# Patient Record
Sex: Male | Born: 1945 | Race: White | Hispanic: No | Smoking: Former smoker
Health system: Southern US, Community
[De-identification: ages and names within clinical notes are randomized; demographics above are authoritative.]

## PROBLEM LIST (undated history)

## (undated) DIAGNOSIS — I714 Abdominal aortic aneurysm, without rupture, unspecified: Secondary | ICD-10-CM

## (undated) DIAGNOSIS — Z720 Tobacco use: Secondary | ICD-10-CM

## (undated) DIAGNOSIS — I2510 Atherosclerotic heart disease of native coronary artery without angina pectoris: Secondary | ICD-10-CM

## (undated) DIAGNOSIS — J449 Chronic obstructive pulmonary disease, unspecified: Secondary | ICD-10-CM

## (undated) DIAGNOSIS — N400 Enlarged prostate without lower urinary tract symptoms: Secondary | ICD-10-CM

## (undated) DIAGNOSIS — G4733 Obstructive sleep apnea (adult) (pediatric): Secondary | ICD-10-CM

## (undated) DIAGNOSIS — G4731 Primary central sleep apnea: Secondary | ICD-10-CM

## (undated) DIAGNOSIS — I252 Old myocardial infarction: Secondary | ICD-10-CM

## (undated) DIAGNOSIS — I10 Essential (primary) hypertension: Secondary | ICD-10-CM

## (undated) DIAGNOSIS — I779 Disorder of arteries and arterioles, unspecified: Secondary | ICD-10-CM

## (undated) DIAGNOSIS — E7800 Pure hypercholesterolemia, unspecified: Secondary | ICD-10-CM

## (undated) DIAGNOSIS — C61 Malignant neoplasm of prostate: Secondary | ICD-10-CM

## (undated) DIAGNOSIS — C801 Malignant (primary) neoplasm, unspecified: Secondary | ICD-10-CM

## (undated) HISTORY — DX: Abdominal aortic aneurysm, without rupture: I71.4

## (undated) HISTORY — DX: Pure hypercholesterolemia, unspecified: E78.00

## (undated) HISTORY — DX: Malignant (primary) neoplasm, unspecified: C80.1

## (undated) HISTORY — PX: HERNIA REPAIR: SHX51

## (undated) HISTORY — DX: Malignant neoplasm of prostate: C61

## (undated) HISTORY — DX: Primary central sleep apnea: G47.31

## (undated) HISTORY — PX: CATARACT EXTRACTION: SUR2

## (undated) HISTORY — DX: Disorder of arteries and arterioles, unspecified: I77.9

## (undated) HISTORY — DX: Essential (primary) hypertension: I10

## (undated) HISTORY — DX: Old myocardial infarction: I25.2

## (undated) HISTORY — DX: Abdominal aortic aneurysm, without rupture, unspecified: I71.40

---

## 2011-06-12 DIAGNOSIS — G4733 Obstructive sleep apnea (adult) (pediatric): Secondary | ICD-10-CM | POA: Diagnosis not present

## 2011-06-12 DIAGNOSIS — N400 Enlarged prostate without lower urinary tract symptoms: Secondary | ICD-10-CM | POA: Diagnosis not present

## 2011-06-12 DIAGNOSIS — F17200 Nicotine dependence, unspecified, uncomplicated: Secondary | ICD-10-CM | POA: Diagnosis not present

## 2011-06-12 DIAGNOSIS — J438 Other emphysema: Secondary | ICD-10-CM | POA: Diagnosis not present

## 2011-10-20 DIAGNOSIS — H2510 Age-related nuclear cataract, unspecified eye: Secondary | ICD-10-CM | POA: Diagnosis not present

## 2011-10-20 DIAGNOSIS — H524 Presbyopia: Secondary | ICD-10-CM | POA: Diagnosis not present

## 2011-11-05 DIAGNOSIS — Z0000 Encounter for general adult medical examination without abnormal findings: Secondary | ICD-10-CM | POA: Diagnosis not present

## 2011-11-05 DIAGNOSIS — Z23 Encounter for immunization: Secondary | ICD-10-CM | POA: Diagnosis not present

## 2011-11-05 DIAGNOSIS — Z125 Encounter for screening for malignant neoplasm of prostate: Secondary | ICD-10-CM | POA: Diagnosis not present

## 2011-11-05 DIAGNOSIS — Z136 Encounter for screening for cardiovascular disorders: Secondary | ICD-10-CM | POA: Diagnosis not present

## 2011-11-05 DIAGNOSIS — Z131 Encounter for screening for diabetes mellitus: Secondary | ICD-10-CM | POA: Diagnosis not present

## 2011-11-10 DIAGNOSIS — H2510 Age-related nuclear cataract, unspecified eye: Secondary | ICD-10-CM | POA: Diagnosis not present

## 2011-11-27 DIAGNOSIS — H2510 Age-related nuclear cataract, unspecified eye: Secondary | ICD-10-CM | POA: Diagnosis not present

## 2011-11-27 DIAGNOSIS — H25049 Posterior subcapsular polar age-related cataract, unspecified eye: Secondary | ICD-10-CM | POA: Diagnosis not present

## 2011-12-15 DIAGNOSIS — H2181 Floppy iris syndrome: Secondary | ICD-10-CM | POA: Diagnosis not present

## 2011-12-15 DIAGNOSIS — H269 Unspecified cataract: Secondary | ICD-10-CM | POA: Diagnosis not present

## 2011-12-15 DIAGNOSIS — H25049 Posterior subcapsular polar age-related cataract, unspecified eye: Secondary | ICD-10-CM | POA: Diagnosis not present

## 2012-05-05 DIAGNOSIS — G4733 Obstructive sleep apnea (adult) (pediatric): Secondary | ICD-10-CM | POA: Diagnosis not present

## 2012-05-19 DIAGNOSIS — Z86010 Personal history of colonic polyps: Secondary | ICD-10-CM | POA: Diagnosis not present

## 2012-06-02 DIAGNOSIS — G4733 Obstructive sleep apnea (adult) (pediatric): Secondary | ICD-10-CM | POA: Diagnosis not present

## 2012-06-08 DIAGNOSIS — R0681 Apnea, not elsewhere classified: Secondary | ICD-10-CM | POA: Diagnosis not present

## 2012-06-08 DIAGNOSIS — R0609 Other forms of dyspnea: Secondary | ICD-10-CM | POA: Diagnosis not present

## 2012-06-08 DIAGNOSIS — G4710 Hypersomnia, unspecified: Secondary | ICD-10-CM | POA: Diagnosis not present

## 2012-06-25 ENCOUNTER — Inpatient Hospital Stay (HOSPITAL_COMMUNITY)
Admission: EM | Admit: 2012-06-25 | Discharge: 2012-06-27 | DRG: 247 | Disposition: A | Discharge status: Home / Self Care | Payer: Medicare Other | Source: Ambulatory Visit | Attending: Cardiovascular Disease | Admitting: Cardiovascular Disease

## 2012-06-25 ENCOUNTER — Ambulatory Visit (HOSPITAL_COMMUNITY): Admit: 2012-06-25 | Payer: Self-pay | Admitting: Cardiovascular Disease

## 2012-06-25 ENCOUNTER — Encounter (HOSPITAL_COMMUNITY)
Admission: EM | Disposition: A | Discharge status: Home / Self Care | Payer: Self-pay | Source: Ambulatory Visit | Attending: Cardiovascular Disease

## 2012-06-25 ENCOUNTER — Encounter (HOSPITAL_COMMUNITY): Payer: Self-pay | Admitting: Physician Assistant

## 2012-06-25 DIAGNOSIS — F17200 Nicotine dependence, unspecified, uncomplicated: Secondary | ICD-10-CM | POA: Diagnosis not present

## 2012-06-25 DIAGNOSIS — Z79899 Other long term (current) drug therapy: Secondary | ICD-10-CM

## 2012-06-25 DIAGNOSIS — Z720 Tobacco use: Secondary | ICD-10-CM

## 2012-06-25 DIAGNOSIS — R079 Chest pain, unspecified: Secondary | ICD-10-CM | POA: Diagnosis not present

## 2012-06-25 DIAGNOSIS — I2119 ST elevation (STEMI) myocardial infarction involving other coronary artery of inferior wall: Principal | ICD-10-CM

## 2012-06-25 DIAGNOSIS — J4489 Other specified chronic obstructive pulmonary disease: Secondary | ICD-10-CM | POA: Diagnosis present

## 2012-06-25 DIAGNOSIS — R0789 Other chest pain: Secondary | ICD-10-CM | POA: Diagnosis not present

## 2012-06-25 DIAGNOSIS — E871 Hypo-osmolality and hyponatremia: Secondary | ICD-10-CM | POA: Diagnosis not present

## 2012-06-25 DIAGNOSIS — J449 Chronic obstructive pulmonary disease, unspecified: Secondary | ICD-10-CM | POA: Diagnosis present

## 2012-06-25 DIAGNOSIS — I219 Acute myocardial infarction, unspecified: Secondary | ICD-10-CM | POA: Diagnosis not present

## 2012-06-25 DIAGNOSIS — I2510 Atherosclerotic heart disease of native coronary artery without angina pectoris: Secondary | ICD-10-CM

## 2012-06-25 DIAGNOSIS — G4733 Obstructive sleep apnea (adult) (pediatric): Secondary | ICD-10-CM | POA: Diagnosis present

## 2012-06-25 HISTORY — DX: Benign prostatic hyperplasia without lower urinary tract symptoms: N40.0

## 2012-06-25 HISTORY — DX: Tobacco use: Z72.0

## 2012-06-25 HISTORY — DX: Obstructive sleep apnea (adult) (pediatric): G47.33

## 2012-06-25 HISTORY — PX: LEFT HEART CATHETERIZATION WITH CORONARY ANGIOGRAM: SHX5451

## 2012-06-25 HISTORY — DX: Atherosclerotic heart disease of native coronary artery without angina pectoris: I25.10

## 2012-06-25 HISTORY — DX: Chronic obstructive pulmonary disease, unspecified: J44.9

## 2012-06-25 HISTORY — PX: CORONARY ANGIOPLASTY WITH STENT PLACEMENT: SHX49

## 2012-06-25 HISTORY — PX: PERCUTANEOUS CORONARY STENT INTERVENTION (PCI-S): SHX5485

## 2012-06-25 LAB — CBC
MCV: 93.7 fL (ref 78.0–100.0)
Platelets: 112 10*3/uL — ABNORMAL LOW (ref 150–400)
RDW: 13 % (ref 11.5–15.5)
WBC: 8 10*3/uL (ref 4.0–10.5)

## 2012-06-25 LAB — APTT: aPTT: 110 seconds — ABNORMAL HIGH (ref 24–37)

## 2012-06-25 LAB — COMPREHENSIVE METABOLIC PANEL
ALT: 18 U/L (ref 0–53)
AST: 33 U/L (ref 0–37)
Albumin: 3.1 g/dL — ABNORMAL LOW (ref 3.5–5.2)
Chloride: 102 mEq/L (ref 96–112)
Creatinine, Ser: 0.9 mg/dL (ref 0.50–1.35)
Sodium: 134 mEq/L — ABNORMAL LOW (ref 135–145)
Total Bilirubin: 0.5 mg/dL (ref 0.3–1.2)

## 2012-06-25 LAB — POCT ACTIVATED CLOTTING TIME: Activated Clotting Time: 595 seconds

## 2012-06-25 LAB — TROPONIN I
Troponin I: 3.25 ng/mL (ref <0.30)
Troponin I: 12.33 ng/mL (ref <0.30)

## 2012-06-25 LAB — MRSA PCR SCREENING: MRSA by PCR: NEGATIVE

## 2012-06-25 LAB — PROTIME-INR: Prothrombin Time: 55.7 seconds — ABNORMAL HIGH (ref 11.6–15.2)

## 2012-06-25 LAB — CK TOTAL AND CKMB (NOT AT ARMC): Total CK: 210 U/L (ref 7–232)

## 2012-06-25 SURGERY — LEFT HEART CATHETERIZATION WITH CORONARY ANGIOGRAM
Anesthesia: LOCAL

## 2012-06-25 MED ORDER — ONDANSETRON HCL 4 MG/2ML IJ SOLN
INTRAMUSCULAR | Status: AC
  Filled 2012-06-25: qty 2

## 2012-06-25 MED ORDER — SODIUM CHLORIDE 0.9 % IV SOLN: INTRAVENOUS | Status: AC

## 2012-06-25 MED ORDER — TAMSULOSIN HCL 0.4 MG PO CAPS
0.8000 mg | ORAL_CAPSULE | Freq: Every day | ORAL | Status: DC
  Administered 2012-06-25 – 2012-06-26 (×2): 0.8 mg via ORAL
  Filled 2012-06-25 (×3): qty 2

## 2012-06-25 MED ORDER — METOPROLOL TARTRATE 25 MG PO TABS
25.0000 mg | ORAL_TABLET | Freq: Two times a day (BID) | ORAL | Status: DC
  Administered 2012-06-25 – 2012-06-27 (×4): 25 mg via ORAL
  Filled 2012-06-25 (×5): qty 1

## 2012-06-25 MED ORDER — ATROPINE SULFATE 1 MG/ML IJ SOLN
INTRAMUSCULAR | Status: AC
  Filled 2012-06-25: qty 1

## 2012-06-25 MED ORDER — PRASUGREL HCL 10 MG PO TABS
10.0000 mg | ORAL_TABLET | Freq: Every day | ORAL | Status: DC
  Administered 2012-06-26 – 2012-06-27 (×2): 10 mg via ORAL
  Filled 2012-06-25 (×2): qty 1

## 2012-06-25 MED ORDER — BIVALIRUDIN 250 MG IV SOLR
INTRAVENOUS | Status: AC
  Filled 2012-06-25: qty 250

## 2012-06-25 MED ORDER — ZOLPIDEM TARTRATE 5 MG PO TABS: 5.0000 mg | ORAL_TABLET | Freq: Every evening | ORAL | Status: DC | PRN

## 2012-06-25 MED ORDER — ONDANSETRON HCL 4 MG/2ML IJ SOLN: 4.0000 mg | Freq: Four times a day (QID) | INTRAMUSCULAR | Status: DC | PRN

## 2012-06-25 MED ORDER — FENTANYL CITRATE 0.05 MG/ML IJ SOLN
INTRAMUSCULAR | Status: AC
  Filled 2012-06-25: qty 2

## 2012-06-25 MED ORDER — PRASUGREL HCL 10 MG PO TABS
ORAL_TABLET | ORAL | Status: AC
  Filled 2012-06-25: qty 6

## 2012-06-25 MED ORDER — OXYCODONE-ACETAMINOPHEN 5-325 MG PO TABS: 1.0000 | ORAL_TABLET | ORAL | Status: DC | PRN

## 2012-06-25 MED ORDER — NITROGLYCERIN 1 MG/10 ML FOR IR/CATH LAB
INTRA_ARTERIAL | Status: AC
  Filled 2012-06-25: qty 10

## 2012-06-25 MED ORDER — ASPIRIN 81 MG PO CHEW
81.0000 mg | CHEWABLE_TABLET | Freq: Every day | ORAL | Status: DC
  Administered 2012-06-26 – 2012-06-27 (×2): 81 mg via ORAL
  Filled 2012-06-25 (×2): qty 1

## 2012-06-25 MED ORDER — ATORVASTATIN CALCIUM 80 MG PO TABS
80.0000 mg | ORAL_TABLET | Freq: Every day | ORAL | Status: DC
  Administered 2012-06-25 – 2012-06-26 (×2): 80 mg via ORAL
  Filled 2012-06-25 (×3): qty 1

## 2012-06-25 MED ORDER — ACETAMINOPHEN 325 MG PO TABS: 650.0000 mg | ORAL_TABLET | ORAL | Status: DC | PRN

## 2012-06-25 NOTE — Progress Notes (Signed)
Chaplain Note:  Chaplain visited pt's family and cath lab staff during pt's cath procedure.  Chaplain provided spiritual comfort and support for pt's family and cath lab staff.  All expressed appreciation for chaplain support.  Chaplain will follow up as needed.  06/25/12 1400  Clinical Encounter Type  Visited With Patient;Family;Health care provider  Visit Type Spiritual support  Referral From Other (Comment) (Code Stemi page)  Spiritual Encounters  Spiritual Needs Emotional  Stress Factors  Patient Stress Factors Health changes  Family Stress Factors Lack of knowledge;Major life changes  Verdie Shire, Chaplain [phone]

## 2012-06-25 NOTE — Progress Notes (Signed)
CRITICAL VALUE ALERT  Critical value received: Trop 3.25 MB 8.2  Date of notification:  06/25/12  Time of notification:  1320  Critical value read back:yes  Nurse who received alert:  P. Mikle Bosworth RN  MD notified (1st page): expected value  Time of first page: N/A  MD notified (2nd page):  Time of second page:  Responding MD:N /A  Time MD responded:N/A

## 2012-06-25 NOTE — Progress Notes (Signed)
Rhonda Barrett PA notified of INR 7.07. Instructed per Theodore Demark PA to notify pharmacist with value for follow up lab. Pharmacist notified and requested PT/INR to be drawn in AM 06/26/12. Orders initiated.

## 2012-06-25 NOTE — Progress Notes (Signed)
CRITICAL VALUE ALERT  Critical value received:  INR 7.07  Date of notification: 06/25/12  Time of notification:  1600  Critical value read backyes  Nurse who received alert: P. Mikle Bosworth RN  MD notified (1st page):  Theodore Demark PA  Time of first page:1600  MD notified (2nd page):  Time of second page:  Responding MD:  Theodore Demark NP  Time MD responded:  1600

## 2012-06-25 NOTE — H&P (Signed)
History and Physical   Patient ID: Juan Lynn MRN: [id number], DOB/AGE: [birth year] 67 y.o. Date of Encounter: 06/25/2012  Primary Physician: Pending Primary Cardiologist: None  Chief Complaint:  Inferior STEMI  HPI: Juan Lynn is a 67 y.o. male with no history of CAD. He has been having occasional episodes of chest pain that resolved without intervention in less than an hour. Today, he had onset of substernal chest pain at approximately 9 AM. This was associated with shortness of breath, some nausea and diaphoresis. When his symptoms did not resolve, EMS was called. His ECG was consistent with an inferior ST elevation MI. He was transferred emergently to [facility] cone and taken directly to the Cath Lab.  He has no history of coronary artery disease and no cardiac evaluations in the past. He is not aware of any history of hypertension, hyperlipidemia or diabetes. He gets yearly checkups but rarely sees a physician in between as his general health is quite good. He has had no recent illnesses, and has no ongoing bleeding issues or other concerns.   Past Medical History  Diagnosis Date  . BPH (benign prostatic hyperplasia)   . COPD (chronic obstructive pulmonary disease)   . OSA (obstructive sleep apnea)   . Tobacco abuse     Surgical History:  Past Surgical History  Procedure Laterality Date  . Hernia repair    . Cataract extraction       I have reviewed the patient's current medications. Prior to Admission medications   Flomax    Scheduled Meds: Continuous Infusions: PRN Meds:.    Allergies: No Known Allergies  History   Social History  . Marital Status:  divorced     Spouse Name: N/A    Number of Children: N/A  . Years of Education: N/A   Occupational History  . Retired    Social History Main Topics  . Smoking status:  current smoker, 50-pack-year history   . Smokeless tobacco:  none   . Alcohol Use:  none   . Drug Use:  none   . Sexually Active:  none     Other Topics Concern  . Not on file   Social History Narrative   Patient lives with his daughter    Family History  Problem Relation Age of Onset  . Heart attack Father   . CAD Father    Family Status  Relation Status Death Age  . Father Deceased 58    Died of prostate cancer, history of CAD  . Mother Deceased     No history of CAD    Review of Systems:   Full 14-point review of systems otherwise negative except as noted above.  Physical Exam: Pulse 81, height 5\' 7"  (1.702 m), weight 165 lb (74.844 kg). 110/70 General: Well developed, well nourished,male in acute distress. Head: Normocephalic, atraumatic, sclera non-icteric, no xanthomas, nares are without discharge. Dentition: Good Neck: No carotid bruits. JVD not elevated. No thyromegally Lungs: Good expansion bilaterally. without wheezes or rhonchi.  Heart: Regular rate and rhythm with S1 S2.  No S3 or S4.  No murmur, no rubs, or gallops appreciated. Abdomen: Soft, non-tender, non-distended with normoactive bowel sounds. No hepatomegaly. No rebound/guarding. No obvious abdominal masses. Msk:  Strength and tone appear normal for age. No joint deformities or effusions, no spine or costo-vertebral angle tenderness. Extremities: No clubbing or cyanosis. No edema.  Distal pedal pulses are 2+ in 4 extrem Neuro: Alert and oriented X 3. Moves all extremities spontaneously. No focal  deficits noted. Psych:  Responds to questions appropriately with a normal affect. Skin: No rashes or lesions noted  Labs: Pending  Radiology/Studies: Pending  ECG: Sinus rhythm, heart rate 79, PR 0.108 , inferior ST elevation with reciprocal anteroseptal changes noted.  ASSESSMENT AND PLAN:  Principal Problem:   ST elevation myocardial infarction (STEMI) of inferior wall, initial episode of care   Juan Lynn has been taken directly to the Cath Lab. Further evaluation and treatment depending on the results. He will be screened for cardiac risk  factors and counseled on smoking cessation.  Theodore Demark, PA-C  I have personally seen and examined this patient with Theodore Demark, PA-C. I agree with the assessment and plan as outlined above. Inferior STEMI. Plans for emergent cardiac cath with PCI.   Juan Lynn 2:44 PM 06/25/2012

## 2012-06-25 NOTE — CV Procedure (Signed)
Cardiac Catheterization Operative Report  Juan Lynn [id number] 4/25/20142:07 PM No primary provider on file.  Procedure Performed:  1. Left Heart Catheterization 2. Selective Coronary Angiography 3. Left ventricular angiogram 4. Aspiration Thrombectomy RCA 5. PTCA/DES x 1 mid RCA 6. Angioseal RFA  Operator: Verne Carrow, MD  Indication: 67 yo male with history of COPD, tobacco abuse, OSA who is transferred via EMS with acute inferior STEMI. Pain began at 9am this morning. Code STEMI called 12:52pm. Pt with ongoing chest pain upon arrival at [facility].                                       Procedure Details: The risks, benefits, complications, treatment options, and expected outcomes were discussed with the patient. The patient and/or family concurred with the proposed plan, giving informed verbal consent. Emergency consent documented. The patient was brought to the cath lab via EMS. The patient was not sedated due to hypotension. The right groin was prepped and draped in the usual manner. Using the modified Seldinger access technique, a 6 French sheath was placed in the right femoral artery. A JL4 catheter was used to engage the left main artery. Selective angiopraphy of the left coronary system was performed. I then engaged the RCA with a 6 [place] JR4 guiding catheter. The RCA was found to be totally occluded in the mid vessel. He was given a bolus of Angiomax and a drip was started. He was given 60 mg Effient po x 1. When the ACT was greater than 200, I passed a BMW wire down the RCA and beyond the total occlusion. Flow was re-established with the wire. I then used a 2.5 x 15 mm balloon to pre-dilate the stenosis. I then passed a Terumo Priority One aspiration catheter into the mid RCA and extracted a large red thrombus. The haziness in the mid RCA improved after thrombectomy. I then carefully positioned a 3.0 x 24 mm Promus Premier in the mid RCA and deployed it at 14 atm. The  stent was post-dilated with a 3.5 x 20 mm Empire balloon x 1. There was an excellent result. The stenosis was taken from 100% down to 0%.  A pigtail catheter was used to perform a left ventricular angiogram. An Angioseal femoral artery closure device was placed in the right femoral artery.   The case was complicated by relative hypotension and bradycardia. He was given atropine x 1.   There were no immediate procedural complications. The patient was taken to the CCU in stable condition.   Hemodynamic Findings: Central aortic pressure: 108/66 Left ventricular pressure: 107/9/16  Angiographic Findings:  Left main:  No obstructive disease.   Left Anterior Descending Artery: Large caliber vessel that courses to the apex. Mild luminal irregularities in the mid vessel.   Circumflex Artery: Large caliber vessel with termination into a obtuse marginal branch. 30% proximal AV groove Circumflex stenosis. Small intermediate branch   Right Coronary Artery: Large dominant vessel with 100% mid occlusion.   Left Ventricular Angiogram: LVEF=55%  Impression: 1. Inferior STEMI secondary to occluded mid RCA 2. Successful PTCA/DES x 1 mid RCA 3. Mild disease LAD and Circumflex 4. Preserved LV systolic function  Recommendations: Will admit to CCU. He will be continued on on ASA and Effient for one year. Will start beta blocker and statin. If BP tolerates, start Ace-inh in am. Echo in am. No plans for d/c home before  Sunday.        Complications:  None. The patient tolerated the procedure well.

## 2012-06-26 DIAGNOSIS — F17200 Nicotine dependence, unspecified, uncomplicated: Secondary | ICD-10-CM

## 2012-06-26 DIAGNOSIS — I2510 Atherosclerotic heart disease of native coronary artery without angina pectoris: Secondary | ICD-10-CM | POA: Diagnosis not present

## 2012-06-26 DIAGNOSIS — I2119 ST elevation (STEMI) myocardial infarction involving other coronary artery of inferior wall: Secondary | ICD-10-CM | POA: Diagnosis not present

## 2012-06-26 DIAGNOSIS — I219 Acute myocardial infarction, unspecified: Secondary | ICD-10-CM

## 2012-06-26 DIAGNOSIS — E871 Hypo-osmolality and hyponatremia: Secondary | ICD-10-CM | POA: Diagnosis not present

## 2012-06-26 LAB — BASIC METABOLIC PANEL
BUN: 15 mg/dL (ref 6–23)
Creatinine, Ser: 0.96 mg/dL (ref 0.50–1.35)
GFR calc Af Amer: >90 mL/min (ref >90)
GFR calc non Af Amer: 84 mL/min — ABNORMAL LOW (ref >90)

## 2012-06-26 LAB — CBC
HCT: 42.8 % (ref 39.0–52.0)
MCHC: 36 g/dL (ref 30.0–36.0)
MCV: 94.3 fL (ref 78.0–100.0)
Platelets: 131 10*3/uL — ABNORMAL LOW (ref 150–400)
RDW: 13.2 % (ref 11.5–15.5)

## 2012-06-26 LAB — TROPONIN I: Troponin I: 19.04 ng/mL (ref <0.30)

## 2012-06-26 LAB — HEMOGLOBIN A1C: Mean Plasma Glucose: 105 mg/dL (ref <117)

## 2012-06-26 NOTE — Progress Notes (Signed)
  Echocardiogram 2D Echocardiogram has been performed.  Georgian Co 06/26/2012, 11:11 AM

## 2012-06-26 NOTE — Progress Notes (Signed)
SUBJECTIVE: The patient is doing well today.  At this time, he denies chest pain, shortness of breath, or any new concerns.  Marland Kitchen aspirin  81 mg Oral Daily  . atorvastatin  80 mg Oral q1800  . metoprolol tartrate  25 mg Oral BID  . prasugrel  10 mg Oral Daily  . tamsulosin  0.8 mg Oral QHS      OBJECTIVE: Physical Exam: Filed Vitals:   06/25/12 2019 06/26/12 0000 06/26/12 0604 06/26/12 0800  BP: 126/71 97/50 130/76 106/62  Pulse: 60   62  Temp: 98.8 F (37.1 C) 98.5 F (36.9 C) 97.9 F (36.6 C) 98.3 F (36.8 C)  TempSrc: Oral Oral Oral Oral  Resp:   16 18  Height:      Weight:      SpO2: 95% 92% 95% 100%    Intake/Output Summary (Last 24 hours) at 06/26/12 0936 Last data filed at 06/25/12 1800  Gross per 24 hour  Intake    780 ml  Output    300 ml  Net    480 ml    Telemetry reveals sinus rhythm  GEN- The patient is well appearing, alert and oriented x 3 today.   Head- normocephalic, atraumatic Eyes-  Sclera clear, conjunctiva pink Ears- hearing intact Oropharynx- clear Neck- supple, no JVP Lymph- no cervical lymphadenopathy Lungs- Clear to ausculation bilaterally, normal work of breathing Heart- Regular rate and rhythm, no murmurs, rubs or gallops, PMI not laterally displaced GI- soft, NT, ND, + BS Extremities- no clubbing, cyanosis, or edema, no hematoma bruit Skin- no rash or lesion Psych- euthymic mood, full affect Neuro- strength and sensation are intact  LABS: Basic Metabolic Panel:  Recent Labs  16/10/96 1355 06/26/12 0309  NA 134* 135  K 3.5 3.8  CL 102 102  CO2 21 24  GLUCOSE 153* 104*  BUN 14 15  CREATININE 0.90 0.96  CALCIUM 7.8* 8.7   Liver Function Tests:  Recent Labs  06/25/12 1355  AST 33  ALT 18  ALKPHOS 64  BILITOT 0.5  PROT 5.7*  ALBUMIN 3.1*   No results found for this basename: LIPASE, AMYLASE,  in the last 72 hours CBC:  Recent Labs  06/25/12 1355 06/26/12 0309  WBC 8.0 9.3  HGB 14.5 15.4  HCT 40.0 42.8    MCV 93.7 94.3  PLT 112* 131*   Cardiac Enzymes:  Recent Labs  06/25/12 1355 06/25/12 1547 06/25/12 2043 06/26/12 0309  CKTOTAL 210  211  --   --   --   CKMB 8.2*  --   --   --   TROPONINI 3.25* 12.33* >20.00* 19.04*   BNP: No components found with this basename: POCBNP,  D-Dimer: No results found for this basename: DDIMER,  in the last 72 hours Hemoglobin A1C:  Recent Labs  06/25/12 1355  HGBA1C 5.3   Fasting Lipid Panel: No results found for this basename: CHOL, HDL, LDLCALC, TRIG, CHOLHDL, LDLDIRECT,  in the last 72 hours Thyroid Function Tests: No results found for this basename: TSH, T4TOTAL, FREET3, T3FREE, THYROIDAB,  in the last 72 hours Anemia Panel: No results found for this basename: VITAMINB12, FOLATE, FERRITIN, TIBC, IRON, RETICCTPCT,  in the last 72 hours  RADIOLOGY: No results found.  ASSESSMENT AND PLAN:  Principal Problem:   ST elevation myocardial infarction (STEMI) of inferior wall, initial episode of care  1. STEMI Doing well s/p PCI of the RCA Echo pending Continue effient, ASA, beta blocker  2. Tobacco Cessation  advised  Transfer to telemetry Possibly home in next 24 hours or so.     Hillis Range, MD 06/26/2012 9:36 AM

## 2012-06-27 ENCOUNTER — Encounter (HOSPITAL_COMMUNITY): Payer: Self-pay | Admitting: Physician Assistant

## 2012-06-27 DIAGNOSIS — I2510 Atherosclerotic heart disease of native coronary artery without angina pectoris: Secondary | ICD-10-CM

## 2012-06-27 DIAGNOSIS — E871 Hypo-osmolality and hyponatremia: Secondary | ICD-10-CM

## 2012-06-27 DIAGNOSIS — G4733 Obstructive sleep apnea (adult) (pediatric): Secondary | ICD-10-CM

## 2012-06-27 DIAGNOSIS — I2119 ST elevation (STEMI) myocardial infarction involving other coronary artery of inferior wall: Secondary | ICD-10-CM | POA: Diagnosis not present

## 2012-06-27 DIAGNOSIS — J449 Chronic obstructive pulmonary disease, unspecified: Secondary | ICD-10-CM

## 2012-06-27 DIAGNOSIS — Z720 Tobacco use: Secondary | ICD-10-CM

## 2012-06-27 MED ORDER — EXERCISE FOR HEART AND HEALTH BOOK
Freq: Once | Status: AC
  Administered 2012-06-27: 13:00:00
  Filled 2012-06-27: qty 1

## 2012-06-27 MED ORDER — ANGIOPLASTY BOOK
Freq: Once | Status: AC
  Administered 2012-06-27: 13:00:00
  Filled 2012-06-27: qty 1

## 2012-06-27 MED ORDER — ACTIVE PARTNERSHIP FOR HEALTH OF YOUR HEART BOOK
Freq: Once | Status: AC
  Administered 2012-06-27: 13:00:00
  Filled 2012-06-27: qty 1

## 2012-06-27 MED ORDER — HEART ATTACK BOUNCING BOOK
Freq: Once | Status: AC
  Administered 2012-06-27: 13:00:00
  Filled 2012-06-27: qty 1

## 2012-06-27 MED ORDER — THE SENSUOUS HEART BOOK
Freq: Once | Status: AC
  Administered 2012-06-27: 13:00:00
  Filled 2012-06-27: qty 1

## 2012-06-27 MED ORDER — LISINOPRIL 2.5 MG PO TABS
2.5000 mg | ORAL_TABLET | Freq: Every day | ORAL | Status: DC
  Administered 2012-06-27: 2.5 mg via ORAL
  Filled 2012-06-27: qty 1

## 2012-06-27 MED ORDER — NITROGLYCERIN 0.4 MG SL SUBL: 0.4000 mg | SUBLINGUAL_TABLET | SUBLINGUAL | Status: DC | PRN

## 2012-06-27 MED ORDER — ATORVASTATIN CALCIUM 80 MG PO TABS: 80.0000 mg | ORAL_TABLET | Freq: Every day | ORAL | Status: DC

## 2012-06-27 MED ORDER — LISINOPRIL 2.5 MG PO TABS: 2.5000 mg | ORAL_TABLET | Freq: Every day | ORAL | Status: DC

## 2012-06-27 MED ORDER — ASPIRIN 81 MG PO CHEW: 81.0000 mg | CHEWABLE_TABLET | Freq: Every day | ORAL | Status: DC

## 2012-06-27 MED ORDER — PRASUGREL HCL 10 MG PO TABS: 10.0000 mg | ORAL_TABLET | Freq: Every day | ORAL | Status: DC

## 2012-06-27 MED ORDER — METOPROLOL TARTRATE 25 MG PO TABS: 25.0000 mg | ORAL_TABLET | Freq: Two times a day (BID) | ORAL | Status: DC

## 2012-06-27 NOTE — Progress Notes (Signed)
Patient ID: Aeon Kessner, male   DOB: [birth year], 67 y.o.   MRN: [id number]   Patient Name: Juan Lynn Date of Encounter: 06/27/2012    SUBJECTIVE        No chest pain or shortness of breath. Ambulating without difficulty. No complaints from right groin stick. Cardiac rehabilitation has not evaluated.  CURRENT MEDS . aspirin  81 mg Oral Daily  . atorvastatin  80 mg Oral q1800  . metoprolol tartrate  25 mg Oral BID  . prasugrel  10 mg Oral Daily  . tamsulosin  0.8 mg Oral QHS    OBJECTIVE  Filed Vitals:   06/26/12 1100 06/26/12 1330 06/26/12 2100 06/27/12 0500  BP:  99/54 110/71 124/72  Pulse:  65 69 62  Temp:  98.6 F (37 C) 98 F (36.7 C) 97.2 F (36.2 C)  TempSrc:  Oral    Resp: 18 16 16 18   Height:      Weight:      SpO2:  95% 96% 98%    Intake/Output Summary (Last 24 hours) at 06/27/12 0945 Last data filed at 06/26/12 1800  Gross per 24 hour  Intake    720 ml  Output      0 ml  Net    720 ml   Filed Weights   06/25/12 1300  Weight: 165 lb (74.844 kg)    PHYSICAL EXAM  General: Pleasant, NAD. Neuro: Alert and oriented X 3. Moves all extremities spontaneously. Psych: Normal affect. HEENT:  Normal  Neck: Supple without bruits or JVD. Lungs:  Resp regular and unlabored, CTA. Heart: RRR no s3, s4, or murmurs. Abdomen: Soft, non-tender, non-distended, BS + x 4.  Extremities: No clubbing, cyanosis or edema. DP/PT/Radials 2+ and equal bilaterally. Right groin is stable with Angio-Seal  Accessory Clinical Findings  CBC  Recent Labs  06/25/12 1355 06/26/12 0309  WBC 8.0 9.3  HGB 14.5 15.4  HCT 40.0 42.8  MCV 93.7 94.3  PLT 112* 131*   Basic Metabolic Panel  Recent Labs  06/25/12 1355 06/26/12 0309  NA 134* 135  K 3.5 3.8  CL 102 102  CO2 21 24  GLUCOSE 153* 104*  BUN 14 15  CREATININE 0.90 0.96  CALCIUM 7.8* 8.7   Liver Function Tests  Recent Labs  06/25/12 1355  AST 33  ALT 18  ALKPHOS 64  BILITOT 0.5  PROT 5.7*    ALBUMIN 3.1*   No results found for this basename: LIPASE, AMYLASE,  in the last 72 hours Cardiac Enzymes  Recent Labs  06/25/12 1355 06/25/12 1547 06/25/12 2043 06/26/12 0309  CKTOTAL 210  211  --   --   --   CKMB 8.2*  --   --   --   TROPONINI 3.25* 12.33* >20.00* 19.04*   BNP No components found with this basename: POCBNP,  D-Dimer No results found for this basename: DDIMER,  in the last 72 hours Hemoglobin A1C  Recent Labs  06/25/12 1355  HGBA1C 5.3   Fasting Lipid Panel No results found for this basename: CHOL, HDL, LDLCALC, TRIG, CHOLHDL, LDLDIRECT,  in the last 72 hours Thyroid Function Tests No results found for this basename: TSH, T4TOTAL, FREET3, T3FREE, THYROIDAB,  in the last 72 hours  TELE  Normal sinus rhythm  ECG    Radiology/Studies  No results found.  ASSESSMENT AND PLAN  Principal Problem:   ST elevation myocardial infarction (STEMI) of inferior Hewitt Garner, initial episode of care    Patient doing well. His  cardiac rehabilitation consult. Ambulate and discharge this afternoon. Echocardiogram shows EF of 55% with no mitral regurgitation with inferior Corrina Steffensen hypokinesis. Followup with Dr. Sanjuana Kava.  Signed, Valera Castle MD

## 2012-06-27 NOTE — Discharge Summary (Signed)
Discharge Summary   Patient ID: Juan Lynn,  MRN: [id number], DOB/AGE: 07-Dec-1945 67 y.o.  Admit date: 06/25/2012 Discharge date: 06/27/2012  Primary Physician: No primary provider on file. Primary Cardiologist: New- C. Clifton James, MD performed intervention   Discharge Diagnoses Principal Problem:   ST elevation myocardial infarction (STEMI) of inferior [name], initial episode of care Active Problems:   Tobacco abuse   CAD (coronary artery disease), native coronary artery   COPD (chronic obstructive pulmonary disease)   OSA (obstructive sleep apnea)   Hyponatremia  Allergies No Known Allergies  Diagnostic Studies/Procedures  CARDIAC CATHETERIZATION + PERCUTANEOUS CORONARY INTERVENTION - 06/25/12  Hemodynamic Findings:  Central aortic pressure: 108/66  Left ventricular pressure: 107/9/16  Angiographic Findings:  Left main: No obstructive disease.  Left Anterior Descending Artery: Large caliber vessel that courses to the apex. Mild luminal irregularities in the mid vessel.  Circumflex Artery: Large caliber vessel with termination into a obtuse marginal branch. 30% proximal AV groove Circumflex stenosis. Small intermediate branch  Right Coronary Artery: Large dominant vessel with 100% mid occlusion.  Left Ventricular Angiogram: LVEF=55%  Impression:  1. Inferior STEMI secondary to occluded mid RCA  2. Successful PTCA/DES x 1 mid RCA  3. Mild disease LAD and Circumflex  4. Preserved LV systolic function  2D ECHOCARDIOGRAM - 06/26/12  Left ventricle: Hypokinesis basal inferior [name]. The cavity size was normal. Dashun Borre thickness was normal. The estimated ejection fraction was 55%. Right ventricle: The cavity size was normal. Systolic function was normal.  History of Present Illness  Juan Lynn is a 67 y.o. male with no prior cardiac history, and PMHx significant for tobacco abuse, COPD and OSA who presented to [facility] hospital on 06/25/12 and underwent emergent PCI  for inferior STEMI.  He reported experiencing occasional self-limiting episodes of chest discomfort lasting < 1 hour over the past several weeks. On the date of admission, he reported an episode of substernal chest discomfort around 9 AM associated with shortness of breath, diaphoresis and nausea. When his symptoms persisted, he called EMS. EKG in the field indicated inferior ST elevations c/w inferior STEMI. He was transported emergently to the [facility] cath lab.   Hospital Course   He was quickly prepped for the procedure which was accessed via the R femoral artery. As above, this resulted the placement of a DES to a 100% occluded mid RCA lesion. Diffuse residual disease including 30% prox AVG Cx, mild luminal irregularities elsewhere was appreciated. EF 55%. He tolerated the procedure well without complications. The recommendation was made to pursue DAPT- ASA/Effient x 12 months. He was started on BB and statin. He was transferred to CCU and remained stable and asymptomatic overnight. He was noted to be very mildly hyponatremic on admission which improved through his hospitalization. Serial troponins returned markedly elevated. He did not endorse recurrent chest discomfort. Tobacco cessation counseling was provided. He was transferred to telemetry, where he continued to recover. A 2D echo was ordered and revealed EF 55%, basal inferior Juan Lynn HK.   He was evaluated by Dr. Daleen Squibb this AM who deemed him stable for discharge after completing cardiac rehabilitation. He will be discharged on the medication regimen below. BP was a bit labile the day prior to admission, but did return to a normotensive range the date of discharge. Low-dose ACEi will be started. He will need a BMET checked on follow-up to check potassium and kidney function with this. Additionally, he will need LFTs and a lipid panel in approximately 6 weeks since  the initiation of a statin. Of note, Hgb A1C returned at 5.3%.   ADD: Cardiac  rehabilitation was paged several times by the floor RN. The plan was for the patient to ambulate with them prior to discharge. A CR nurse was unable to be reached. He ambulated several times in the hall without incident- no SOB or chest pain. He has been discharged with contact information for the option to pursue outpatient cardiac rehab.   Discharge Vitals:  Blood pressure 123/82, pulse 65, temperature 97.2 F (36.2 C), temperature source Oral, resp. rate 18, height 5\' 7"  (1.702 m), weight 74.844 kg (165 lb), SpO2 98.00%.   Labs: Recent Labs     06/25/12  1355  06/26/12  0309  WBC  8.0  9.3  HGB  14.5  15.4  HCT  40.0  42.8  MCV  93.7  94.3  PLT  112*  131*   Recent Labs Lab 06/25/12 1355 06/26/12 0309  NA 134* 135  K 3.5 3.8  CL 102 102  CO2 21 24  BUN 14 15  CREATININE 0.90 0.96  CALCIUM 7.8* 8.7  PROT 5.7*  --   BILITOT 0.5  --   ALKPHOS 64  --   ALT 18  --   AST 33  --   GLUCOSE 153* 104*   Recent Labs     06/25/12  1355  HGBA1C  5.3   Recent Labs     06/25/12  1355  06/25/12  1547  06/25/12  2043  06/26/12  0309  CKTOTAL  210  211   --    --    --   CKMB  8.2*   --    --    --   TROPONINI  3.25*  12.33*  >20.00*  19.04*   Disposition:  Discharge Orders   Future Orders Complete By Expires     Diet - low sodium heart healthy  As directed     Increase activity slowly  As directed           Follow-up Information   Follow up with [facility]. (Office will call you for an appointment date and time to be scheduled within 1 week. )    Contact information:   [address] [place] [address]       Please follow up. (Please establish or follow-up with a primary care doctor. )       Discharge Medications:    Medication List    TAKE these medications       aspirin 81 MG chewable tablet  Chew 1 tablet (81 mg total) by mouth daily.     atorvastatin 80 MG tablet  Commonly known as:  LIPITOR  Take 1 tablet (80 mg total) by  mouth daily at 6 PM.     lisinopril 2.5 MG tablet  Commonly known as:  PRINIVIL,ZESTRIL  Take 1 tablet (2.5 mg total) by mouth daily.     metoprolol tartrate 25 MG tablet  Commonly known as:  LOPRESSOR  Take 1 tablet (25 mg total) by mouth 2 (two) times daily.     nitroGLYCERIN 0.4 MG SL tablet  Commonly known as:  NITROSTAT  Place 1 tablet (0.4 mg total) under the tongue every 5 (five) minutes as needed for chest pain.     prasugrel 10 MG Tabs  Commonly known as:  EFFIENT  Take 1 tablet (10 mg total) by mouth daily.     tamsulosin 0.4 MG Caps  Commonly known as:  FLOMAX  Take 0.4 mg by mouth 2 (two) times daily.       Outstanding Labs/Studies: BMET in 1 week; LFTs and lipid panel in 6 weeks  Duration of Discharge Encounter: Greater than 30 minutes including physician time.  Signed, R. Hurman Horn, PA-C 06/27/2012, 11:30 AM    Jesse Sans. Daleen Squibb, MD, [facility] Pager:  [phone]

## 2012-06-27 NOTE — Plan of Care (Signed)
Problem: Discharge Progression Outcomes Goal: Discharge plan in place and appropriate Outcome: Completed/Met Date Met:  06/27/12 Pt tol walking in hallway with no increased HR and no SHOB no CP noted. TC to cardiac rehab LMx2. Pt not seen prior to discharge.

## 2012-06-27 NOTE — Progress Notes (Signed)
DC orders received.  Patient stable with no S/S of distress.  Medication and discharge teaching/instrutions reviewed with patient and patient's son.  Patient DC home. [place], Juan Lynn

## 2012-06-28 MED FILL — Sodium Chloride IV Soln 0.9%: INTRAVENOUS | Qty: 50 | Status: AC

## 2012-06-28 NOTE — Progress Notes (Signed)
   CARE MANAGEMENT NOTE 06/28/2012  Patient:  GARVIN, ELLENA   Account Number:  [id number]  Date Initiated:  06/28/2012  Documentation initiated by:  [facility]  Subjective/Objective Assessment:   ST elevation myocardial infarction (STEMI) of inferior wall, initial episode of care     Action/Plan:   Anticipated DC Date:  06/28/2012   Anticipated DC Plan:  HOME/SELF CARE      DC Planning Services  CM consult  Medication Assistance      Choice offered to / List presented to:             Status of service:  Completed, signed off Medicare Important Message given?   (If response is "NO", the following Medicare IM given date fields will be blank) Date Medicare IM given:   Date Additional Medicare IM given:    Discharge Disposition:  HOME/SELF CARE  Per UR Regulation:    If discussed at Long Length of Stay Meetings, dates discussed:    Comments:  06/27/2012 1300 NCM provided pt with a 30 day Effient Trial Card. States she has Location manager. NCM explained the importance of bringing information so his bill will be correct. States he was going to call admitting office in am with information. Isidoro Donning RN CCM Case Mgmt phone [phone]

## 2012-07-05 ENCOUNTER — Encounter: Payer: Self-pay | Admitting: Physician Assistant

## 2012-07-05 ENCOUNTER — Ambulatory Visit (INDEPENDENT_AMBULATORY_CARE_PROVIDER_SITE_OTHER): Payer: Medicare Other | Admitting: Physician Assistant

## 2012-07-05 VITALS — BP 122/82 | HR 71 | Ht 67.5 in | Wt 173.8 lb

## 2012-07-05 DIAGNOSIS — I2510 Atherosclerotic heart disease of native coronary artery without angina pectoris: Secondary | ICD-10-CM | POA: Diagnosis not present

## 2012-07-05 DIAGNOSIS — E785 Hyperlipidemia, unspecified: Secondary | ICD-10-CM

## 2012-07-05 LAB — BASIC METABOLIC PANEL
CO2: 26 mEq/L (ref 19–32)
Chloride: 102 mEq/L (ref 96–112)
Creatinine, Ser: 1.3 mg/dL (ref 0.4–1.5)
Potassium: 4 mEq/L (ref 3.5–5.1)
Sodium: 136 mEq/L (ref 135–145)

## 2012-07-05 MED ORDER — PRASUGREL HCL 10 MG PO TABS: 10.0000 mg | ORAL_TABLET | Freq: Every day | ORAL | Status: DC

## 2012-07-05 MED ORDER — ATORVASTATIN CALCIUM 80 MG PO TABS: 80.0000 mg | ORAL_TABLET | Freq: Every day | ORAL | Status: DC

## 2012-07-05 MED ORDER — LISINOPRIL 2.5 MG PO TABS: 2.5000 mg | ORAL_TABLET | Freq: Every day | ORAL | Status: DC

## 2012-07-05 MED ORDER — NITROGLYCERIN 0.4 MG SL SUBL: 0.4000 mg | SUBLINGUAL_TABLET | SUBLINGUAL | Status: DC | PRN

## 2012-07-05 MED ORDER — METOPROLOL TARTRATE 25 MG PO TABS: 25.0000 mg | ORAL_TABLET | Freq: Two times a day (BID) | ORAL | Status: DC

## 2012-07-05 NOTE — Assessment & Plan Note (Signed)
Patient quite smoking!

## 2012-07-05 NOTE — Assessment & Plan Note (Signed)
Patient suffered a stem in the inferior wall treated with drug-eluting stent to the RCA on 06/25/12. He is doing well post MI without symptoms. Continue Effient. Avoid Viagra at this time. Discuss on office visit with Dr. Clifton James in 2 months. Increase exercise routine.

## 2012-07-05 NOTE — Patient Instructions (Signed)
**Note De-Identified Jaryn Rosko Obfuscation** Your physician recommends that you continue on your current medications as directed. Please refer to the Current Medication list given to you today.  Your physician recommends that you return for lab work in: today and in 6 weeks  Your physician recommends that you schedule a follow-up appointment in: 6 weeks

## 2012-07-05 NOTE — Assessment & Plan Note (Signed)
Follow-up BMET today. ?

## 2012-07-05 NOTE — Assessment & Plan Note (Signed)
Patient commended for smoking cessation.

## 2012-07-05 NOTE — Progress Notes (Signed)
HPI:   This is a 67 year old male patient of Dr. Clifton James, who was admitted to the hospital with the STEMI of the inferior wall treated with drug-eluting stent to the mid RCA. He has residual 30% proximal ABG circumflex, mild luminal irregularities elsewhere ejection fraction 55%. 2-D echo showed EF of 55% with basal inferior wall hypokinesis. Patient did well post MI and actually quit smoking. He is willing to start an exercise routine but would prefer not to do cardiac rehabilitation. He denies any chest pain, palpitations, dizziness, or presyncope. He does have emphysema and has chronic dyspnea on exertion. He is asking about the use of erectile dysfunction drugs.  No Known Allergies  Current Outpatient Prescriptions on File Prior to Visit: aspirin 81 MG chewable tablet, Chew 1 tablet (81 mg total) by mouth daily., Disp: , Rfl:  atorvastatin (LIPITOR) 80 MG tablet, Take 1 tablet (80 mg total) by mouth daily at 6 PM., Disp: 30 tablet, Rfl: 3 lisinopril (PRINIVIL,ZESTRIL) 2.5 MG tablet, Take 1 tablet (2.5 mg total) by mouth daily., Disp: 30 tablet, Rfl: 3 metoprolol tartrate (LOPRESSOR) 25 MG tablet, Take 1 tablet (25 mg total) by mouth 2 (two) times daily., Disp: 60 tablet, Rfl: 3 nitroGLYCERIN (NITROSTAT) 0.4 MG SL tablet, Place 1 tablet (0.4 mg total) under the tongue every 5 (five) minutes as needed for chest pain., Disp: 25 tablet, Rfl: 12 prasugrel (EFFIENT) 10 MG TABS, Take 1 tablet (10 mg total) by mouth daily., Disp: 30 tablet, Rfl: 3 tamsulosin (FLOMAX) 0.4 MG CAPS, Take 0.4 mg by mouth 2 (two) times daily., Disp: , Rfl:   No current facility-administered medications on file prior to visit.   Past Medical History:   BPH (benign prostatic hyperplasia)                           COPD (chronic obstructive pulmonary disease)                 OSA (obstructive sleep apnea)                                Tobacco abuse                                                CAD (coronary artery  disease)                   06/25/2012     Comment:a. Inf STEMI s/p cath- DES-mid RCA b. Echo- EF               55%, basal inferior HK  Past Surgical History:   HERNIA REPAIR                                                 CATARACT EXTRACTION                                           CORONARY ANGIOPLASTY WITH STENT PLACEMENT        06/25/2012     Comment:100% mid  RCA s/p DES, 30% prox AVG Cx, mild               luminal irregularities elsewhere; EF 55%  Review of patient's family history indicates:   Heart attack                   Father                   CAD                            Father                   Social History   Marital Status: Divorced            Spouse Name:                      Years of Education:                 Number of children: 2           Occupational History Occupation          Associate Professor            Comment              Retired                                   Social History Main Topics   Smoking Status: Current Every Day Smoker        Packs/Day: 0.50  Years: 50        Types: Cigarettes   Smokeless Status: Not on file                      Alcohol Use: No             Drug Use: No             Sexual Activity: Not on file        Other Topics            Concern   None on file  Social History Narrative   Patient lives with his daughter    ROS:see history of present illness otherwise negative   PHYSICAL EXAM: Well-nournished, in no acute distress. Neck: No JVD, HJR, Bruit, or thyroid enlargement  Lungs: No tachypnea, clear without wheezing, rales, or rhonchi  Cardiovascular: RRR, PMI not displaced, positive S4, no murmur, bruit, thrill, or heave.  Abdomen: BS normal. Soft without organomegaly, masses, lesions or tenderness.  Extremities: right groin without hematoma or hemorrhage a catheter site, lower extremities otherwise without cyanosis, clubbing or edema. Good distal pulses bilateral  SKin: Warm, no lesions or rashes   Musculoskeletal: No  deformities  Neuro: no focal signs  BP 122/82  Pulse 71  Ht 5' 7.5" (1.715 m)  Wt 173 lb 12.8 oz (78.835 kg)  BMI 26.8 kg/m2   ZOX:WRUEAV sinus rhythm with inferior Q waves and T-wave inversion inferiorly  Cath 06/25/12: Impression: 1. Inferior STEMI secondary to occluded mid RCA 2. Successful PTCA/DES x 1 mid RCA 3. Mild disease LAD and Circumflex 4. Preserved LV systolic function  Recommendations: Will admit to CCU. He will be continued on on ASA and Effient for one year. Will start beta blocker and statin. If BP tolerates, start  Ace-inh in am. Echo in am. No plans for d/c home before Sunday.    2Decho 06/2012:       Study Conclusions  - Left ventricle: Hypokinesis basal inferior wall. The   cavity size was normal. Wall thickness was normal. The   estimated ejection fraction was 55%. - Right ventricle: The cavity size was normal. Systolic   function was normal. Transthoracic echocardiography.  M-mode, complete 2D, spectral Doppler, and color Doppler.  Height:  Height: 170.2cm. Height: 67in.  Weight:  Weight: 74.8kg. Weight: 164.7lb.  Body mass index:  BMI: 25.8kg/m^2.  Body surface area:    BSA: 1.86m^2.  Blood pressure:     11 5/63.  Patient status:  Inpatient.  Location:  ICU/CCU  ------------------------------------------------------------

## 2012-08-11 ENCOUNTER — Encounter: Payer: Self-pay | Admitting: Cardiovascular Disease

## 2012-08-11 ENCOUNTER — Other Ambulatory Visit (INDEPENDENT_AMBULATORY_CARE_PROVIDER_SITE_OTHER): Payer: Medicare Other

## 2012-08-11 ENCOUNTER — Ambulatory Visit (INDEPENDENT_AMBULATORY_CARE_PROVIDER_SITE_OTHER): Payer: Medicare Other | Admitting: Cardiovascular Disease

## 2012-08-11 VITALS — BP 109/82 | HR 70 | Ht 67.5 in | Wt 179.4 lb

## 2012-08-11 DIAGNOSIS — E785 Hyperlipidemia, unspecified: Secondary | ICD-10-CM | POA: Diagnosis not present

## 2012-08-11 DIAGNOSIS — I2510 Atherosclerotic heart disease of native coronary artery without angina pectoris: Secondary | ICD-10-CM

## 2012-08-11 DIAGNOSIS — Z720 Tobacco use: Secondary | ICD-10-CM

## 2012-08-11 DIAGNOSIS — F17200 Nicotine dependence, unspecified, uncomplicated: Secondary | ICD-10-CM

## 2012-08-11 LAB — HEPATIC FUNCTION PANEL
Albumin: 3.8 g/dL (ref 3.5–5.2)
Alkaline Phosphatase: 92 U/L (ref 39–117)
Total Protein: 7 g/dL (ref 6.0–8.3)

## 2012-08-11 LAB — LIPID PANEL
Cholesterol: 119 mg/dL (ref 0–200)
HDL: 51.7 mg/dL (ref >39.00)
LDL Cholesterol: 51 mg/dL (ref 0–99)
Triglycerides: 82 mg/dL (ref 0.0–149.0)
VLDL: 16.4 mg/dL (ref 0.0–40.0)

## 2012-08-11 MED ORDER — SILDENAFIL CITRATE 50 MG PO TABS: 50.0000 mg | ORAL_TABLET | Freq: Every day | ORAL | Status: DC | PRN

## 2012-08-11 NOTE — Progress Notes (Signed)
History of Present Illness: 67 yo male with history of CAD s/p STEMI 06/25/12 with occluded RCA s/p DES x 1 mid RCA, COPD, tobacco abuse here today for cardiac follow up. He was admitted to [facility] 06/25/12 with an inferior STEMI, found to have occluded mid RCA treated with DES x 1 mid RCA. He has residual 30% proximal circumflex stenosis, mild luminal irregularities elsewhere, LVEF= 55%. 2-D echo showed EF of 55% with basal inferior wall hypokinesis. Patient did well post MI and actually quit smoking. He was seen by Herma Carson, PA-C 07/06/12.   He is here today for follow up. He denies any chest pain, palpitations, dizziness, or presyncope. He does have emphysema and has chronic dyspnea on exertion. He is active. He is asking for ED meds.   Primary Care Physician: Kandyce Rud  Last Lipid Profile:Lipid Panel     Component Value Date/Time   CHOL 119 08/11/2012 0924   TRIG 82.0 08/11/2012 0924   HDL 51.70 08/11/2012 0924   CHOLHDL 2 08/11/2012 0924   VLDL 16.4 08/11/2012 0924   LDLCALC 51 08/11/2012 0924     Past Medical History  Diagnosis Date  . BPH (benign prostatic hyperplasia)   . COPD (chronic obstructive pulmonary disease)   . OSA (obstructive sleep apnea)   . Tobacco abuse   . CAD (coronary artery disease) 06/25/2012    a. Inf STEMI s/p cath- DES-mid RCA b. Echo- EF 55%, basal inferior HK    Past Surgical History  Procedure Laterality Date  . Hernia repair    . Cataract extraction    . Coronary angioplasty with stent placement  06/25/2012    100% mid RCA s/p DES, 30% prox AVG Cx, mild luminal irregularities elsewhere; EF 55%    Current Outpatient Prescriptions  Medication Sig Dispense Refill  . aspirin 81 MG chewable tablet Chew 1 tablet (81 mg total) by mouth daily.      Marland Kitchen atorvastatin (LIPITOR) 80 MG tablet Take 1 tablet (80 mg total) by mouth daily at 6 PM.  90 tablet  0  . lisinopril (PRINIVIL,ZESTRIL) 2.5 MG tablet Take 1 tablet (2.5 mg total) by mouth daily.  90  tablet  0  . metoprolol tartrate (LOPRESSOR) 25 MG tablet Take 1 tablet (25 mg total) by mouth 2 (two) times daily.  180 tablet  0  . nitroGLYCERIN (NITROSTAT) 0.4 MG SL tablet Place 1 tablet (0.4 mg total) under the tongue every 5 (five) minutes as needed for chest pain.  25 tablet  0  . prasugrel (EFFIENT) 10 MG TABS Take 1 tablet (10 mg total) by mouth daily.  90 tablet  0  . tamsulosin (FLOMAX) 0.4 MG CAPS Take 0.4 mg by mouth 2 (two) times daily.       No current facility-administered medications for this visit.    No Known Allergies  History   Social History  . Marital Status: Divorced    Spouse Name: N/A    Number of Children: 2  . Years of Education: N/A   Occupational History  . Retired    Social History Main Topics  . Smoking status: Current Every Day Smoker -- 0.50 packs/day for 50 years    Types: Cigarettes  . Smokeless tobacco: Not on file  . Alcohol Use: No  . Drug Use: No  . Sexually Active: Not on file   Other Topics Concern  . Not on file   Social History Narrative   Patient lives with his daughter  Family History  Problem Relation Age of Onset  . Heart attack Father   . CAD Father     Review of Systems:  As stated in the HPI and otherwise negative.   BP 109/82  Pulse 70  Ht 5' 7.5" (1.715 m)  Wt 179 lb 6.4 oz (81.375 kg)  BMI 27.67 kg/m2  Physical Examination: General: Well developed, well nourished, NAD HEENT: OP clear, mucus membranes moist SKIN: warm, dry. No rashes. Neuro: No focal deficits Musculoskeletal: Muscle strength 5/5 all ext Psychiatric: Mood and affect normal Neck: No JVD, no carotid bruits, no thyromegaly, no lymphadenopathy. Lungs:Clear bilaterally, no wheezes, rhonci, crackles Cardiovascular: Regular rate and rhythm. No murmurs, gallops or rubs. Abdomen:Soft. Bowel sounds present. Non-tender.  Extremities: No lower extremity edema. Pulses are 2 + in the bilateral DP/PT.  Assessment and Plan:   1. CAD: Doing well.  Continue ASA and Effient for one year. Continue statin, beta blocker and Ace-inh. BP and lipids are controlled.   2. Tobacco abuse: He has stopped smoking  3. ED: Will start Viagra 50 mg po once daily as needed.

## 2012-08-11 NOTE — Patient Instructions (Addendum)
Your physician wants you to follow-up in:  6 months. You will receive a reminder letter in the mail two months in advance. If you don't receive a letter, please call our office to schedule the follow-up appointment.   

## 2012-08-12 ENCOUNTER — Telehealth: Payer: Self-pay | Admitting: *Deleted

## 2012-08-12 NOTE — Telephone Encounter (Signed)
Received prior auth request from pt's insurance for Viagra. I called [employer] [phone]) and spoke with Danita. Pt is in their system as a male. Pt will need to contact [employer] at [phone] and have them change his status to male in their system.  Viagra will then be covered for 6 tablets.  I spoke with pt and gave him this information and the contact number for [employer].

## 2012-08-30 ENCOUNTER — Ambulatory Visit: Payer: Medicare Other | Admitting: Cardiovascular Disease

## 2012-09-06 DIAGNOSIS — G4733 Obstructive sleep apnea (adult) (pediatric): Secondary | ICD-10-CM | POA: Diagnosis not present

## 2012-09-27 DIAGNOSIS — R05 Cough: Secondary | ICD-10-CM | POA: Diagnosis not present

## 2012-09-30 ENCOUNTER — Other Ambulatory Visit: Payer: Self-pay

## 2012-09-30 MED ORDER — PRASUGREL HCL 10 MG PO TABS: 10.0000 mg | ORAL_TABLET | Freq: Every day | ORAL | Status: DC

## 2012-09-30 MED ORDER — LISINOPRIL 2.5 MG PO TABS: 2.5000 mg | ORAL_TABLET | Freq: Every day | ORAL | Status: DC

## 2012-09-30 MED ORDER — ATORVASTATIN CALCIUM 80 MG PO TABS: 80.0000 mg | ORAL_TABLET | Freq: Every day | ORAL | Status: DC

## 2012-09-30 MED ORDER — METOPROLOL TARTRATE 25 MG PO TABS: 25.0000 mg | ORAL_TABLET | Freq: Two times a day (BID) | ORAL | Status: DC

## 2012-10-09 DIAGNOSIS — L02419 Cutaneous abscess of limb, unspecified: Secondary | ICD-10-CM | POA: Diagnosis not present

## 2012-10-13 DIAGNOSIS — S8010XA Contusion of unspecified lower leg, initial encounter: Secondary | ICD-10-CM | POA: Diagnosis not present

## 2012-11-05 DIAGNOSIS — I252 Old myocardial infarction: Secondary | ICD-10-CM | POA: Diagnosis not present

## 2012-11-05 DIAGNOSIS — I259 Chronic ischemic heart disease, unspecified: Secondary | ICD-10-CM | POA: Diagnosis not present

## 2012-11-05 DIAGNOSIS — L989 Disorder of the skin and subcutaneous tissue, unspecified: Secondary | ICD-10-CM | POA: Diagnosis not present

## 2012-11-05 DIAGNOSIS — G4730 Sleep apnea, unspecified: Secondary | ICD-10-CM | POA: Diagnosis not present

## 2012-11-05 DIAGNOSIS — Z79899 Other long term (current) drug therapy: Secondary | ICD-10-CM | POA: Diagnosis not present

## 2012-11-30 DIAGNOSIS — D235 Other benign neoplasm of skin of trunk: Secondary | ICD-10-CM | POA: Diagnosis not present

## 2012-11-30 DIAGNOSIS — L820 Inflamed seborrheic keratosis: Secondary | ICD-10-CM | POA: Diagnosis not present

## 2012-11-30 DIAGNOSIS — L739 Follicular disorder, unspecified: Secondary | ICD-10-CM | POA: Diagnosis not present

## 2012-11-30 DIAGNOSIS — L821 Other seborrheic keratosis: Secondary | ICD-10-CM | POA: Diagnosis not present

## 2012-12-14 DIAGNOSIS — G4733 Obstructive sleep apnea (adult) (pediatric): Secondary | ICD-10-CM | POA: Diagnosis not present

## 2013-02-04 DIAGNOSIS — G4733 Obstructive sleep apnea (adult) (pediatric): Secondary | ICD-10-CM | POA: Diagnosis not present

## 2013-05-04 DIAGNOSIS — H2510 Age-related nuclear cataract, unspecified eye: Secondary | ICD-10-CM | POA: Diagnosis not present

## 2013-05-05 DIAGNOSIS — J438 Other emphysema: Secondary | ICD-10-CM | POA: Diagnosis not present

## 2013-05-05 DIAGNOSIS — R7989 Other specified abnormal findings of blood chemistry: Secondary | ICD-10-CM | POA: Diagnosis not present

## 2013-05-05 DIAGNOSIS — I2510 Atherosclerotic heart disease of native coronary artery without angina pectoris: Secondary | ICD-10-CM | POA: Diagnosis not present

## 2013-05-05 DIAGNOSIS — N138 Other obstructive and reflux uropathy: Secondary | ICD-10-CM | POA: Diagnosis not present

## 2013-05-05 DIAGNOSIS — N401 Enlarged prostate with lower urinary tract symptoms: Secondary | ICD-10-CM | POA: Diagnosis not present

## 2013-05-05 DIAGNOSIS — Z1331 Encounter for screening for depression: Secondary | ICD-10-CM | POA: Diagnosis not present

## 2013-05-05 DIAGNOSIS — N400 Enlarged prostate without lower urinary tract symptoms: Secondary | ICD-10-CM | POA: Diagnosis not present

## 2013-05-05 DIAGNOSIS — Z79899 Other long term (current) drug therapy: Secondary | ICD-10-CM | POA: Diagnosis not present

## 2013-05-05 DIAGNOSIS — R339 Retention of urine, unspecified: Secondary | ICD-10-CM | POA: Diagnosis not present

## 2013-05-06 ENCOUNTER — Ambulatory Visit (INDEPENDENT_AMBULATORY_CARE_PROVIDER_SITE_OTHER)
Admission: RE | Admit: 2013-05-06 | Discharge: 2013-05-06 | Disposition: A | Discharge status: Home / Self Care | Payer: Medicare Other | Source: Ambulatory Visit | Attending: Internal Medicine | Admitting: Internal Medicine

## 2013-05-06 ENCOUNTER — Encounter: Payer: Self-pay | Admitting: Internal Medicine

## 2013-05-06 ENCOUNTER — Ambulatory Visit (INDEPENDENT_AMBULATORY_CARE_PROVIDER_SITE_OTHER): Payer: Medicare Other | Admitting: Internal Medicine

## 2013-05-06 VITALS — BP 126/78 | HR 80 | Temp 97.9°F | Ht 67.0 in | Wt 187.0 lb

## 2013-05-06 DIAGNOSIS — J449 Chronic obstructive pulmonary disease, unspecified: Secondary | ICD-10-CM

## 2013-05-06 DIAGNOSIS — J4489 Other specified chronic obstructive pulmonary disease: Secondary | ICD-10-CM

## 2013-05-06 DIAGNOSIS — J438 Other emphysema: Secondary | ICD-10-CM | POA: Diagnosis not present

## 2013-05-06 MED ORDER — ALBUTEROL SULFATE HFA 108 (90 BASE) MCG/ACT IN AERS: 2.0000 | INHALATION_SPRAY | Freq: Four times a day (QID) | RESPIRATORY_TRACT | Status: DC | PRN

## 2013-05-06 NOTE — Progress Notes (Signed)
   Subjective:    Patient ID: Juan Lynn, male    DOB: 10-09-1945  MRN: [id number]  HPI  49 ywom quit smoking 2014 p MI after dx of copd 2004 in [place] on prn saba but didn't need very  often referred 05/06/2013 to pulmonary clinic by Dr Drema Dallas.  05/06/2013 1st [facility] office visit/ Maximus Hoffert cc doe x 10 years indolent onset minimally progressive to only with heavy exertion which he avoids, no trouble with steps unless carrying more 25 lbs, not using any saba at all in last year.   No obvious other patterns in day to day or daytime variabilty or assoc chronic cough or cp or chest tightness, subjective wheeze overt sinus or hb symptoms. No unusual exp hx or h/o childhood pna/ asthma or knowledge of premature birth.  Sleeping ok without nocturnal  or early am exacerbation  of respiratory  c/o's or need for noct saba. Also denies any obvious fluctuation of symptoms with weather or environmental changes or other aggravating or alleviating factors except as outlined above   Current Medications, Allergies, Complete Past Medical History, Past Surgical History, Family History, and Social History were reviewed in [employer] record.             Review of Systems  Constitutional: Negative for fever, chills, activity change, appetite change and unexpected weight change.  HENT: Negative for congestion, dental problem, postnasal drip, rhinorrhea, sneezing, sore throat, trouble swallowing and voice change.   Eyes: Negative for visual disturbance.  Respiratory: Positive for shortness of breath. Negative for cough and choking.   Cardiovascular: Negative for chest pain and leg swelling.  Gastrointestinal: Negative for nausea, vomiting and abdominal pain.  Genitourinary: Negative for difficulty urinating.  Musculoskeletal: Negative for arthralgias.  Skin: Negative for rash.  Psychiatric/Behavioral: Negative for behavioral problems and confusion.       Objective:   Physical Exam  amb mod anxious wm nad  Wt Readings from Last 3 Encounters:  05/06/13 187 lb (84.823 kg)  08/11/12 179 lb 6.4 oz (81.375 kg)  07/05/12 173 lb 12.8 oz (78.835 kg)      HEENT mild turbinate edema.  Oropharynx no thrush or excess pnd or cobblestoning.  No JVD or cervical adenopathy. Mild accessory muscle hypertrophy. Trachea midline, nl thryroid. Chest was hyperinflated by percussion with diminished breath sounds and moderate increased exp time without wheeze. Hoover sign positive at mid inspiration. Regular rate and rhythm without murmur gallop or rub or increase P2 or edema.  Abd: no hsm, nl excursion. Ext warm without cyanosis or clubbing.      CXR  05/06/2013 :  Hyperinflation. Stable emphysematous changes and chronic mild interstitial prominence. No active disease.      Assessment & Plan:

## 2013-05-06 NOTE — Assessment & Plan Note (Signed)
I reviewed the Flethcher curve with patient that basically indicates  if you quit smoking when your best day FEV1 is still well preserved (as his appears to be)  it is highly unlikely you will progress to severe disease and informed the patient there was no medication on the market that has proven to change the curve or the likelihood of progression.  Therefore stopping smoking and maintaining abstinence is the most important aspect of care, not choice of inhalers or for that matter, doctors.    If starts to worsen I would have a low threshold to stop acei as his daughter thinks she hears him struggling to breath but this is likely upper airway noise because the pt is not aware of the problem and is not typical at all of copd related sob.   In meantime needs prn saba  The proper method of use, as well as anticipated side effects, of a metered-dose inhaler are discussed and demonstrated to the patient. Improved effectiveness after extensive coaching during this visit to a level of approximately  75%

## 2013-05-06 NOTE — Patient Instructions (Signed)
Please remember to go to the   x-ray department downstairs for your tests - we will call you with the results when they are available.     Please schedule a follow up office visit in 6 weeks, call sooner if needed  

## 2013-05-06 NOTE — Progress Notes (Signed)
Quick Note:  Spoke with pt and notified of results per Dr. Wert. Pt verbalized understanding and denied any questions.  ______ 

## 2013-05-13 DIAGNOSIS — G4733 Obstructive sleep apnea (adult) (pediatric): Secondary | ICD-10-CM | POA: Diagnosis not present

## 2013-05-20 ENCOUNTER — Telehealth: Payer: Self-pay | Admitting: Internal Medicine

## 2013-05-20 DIAGNOSIS — J449 Chronic obstructive pulmonary disease, unspecified: Secondary | ICD-10-CM

## 2013-05-20 NOTE — Telephone Encounter (Signed)
atc pt line rang several times and no answer. No VM WCB

## 2013-05-23 DIAGNOSIS — R972 Elevated prostate specific antigen [PSA]: Secondary | ICD-10-CM | POA: Diagnosis not present

## 2013-05-23 DIAGNOSIS — N138 Other obstructive and reflux uropathy: Secondary | ICD-10-CM | POA: Diagnosis not present

## 2013-05-23 DIAGNOSIS — N401 Enlarged prostate with lower urinary tract symptoms: Secondary | ICD-10-CM | POA: Diagnosis not present

## 2013-05-23 NOTE — Telephone Encounter (Signed)
Called spoke with pt. Made aware of recs. Pt is scheduled for PFT over [facility] at 10 AM on 06/18/14 And will see MW same day at 11:30 Nothing further needed

## 2013-05-23 NOTE — Telephone Encounter (Signed)
I spoke with the Juan Lynn and he states that at last visit Dr. Melvyn Novas mentioned that he would need some "tests" done and thay we would call to set these up with the Juan Lynn. I do not see any mention of anything needed in the note. The Juan Lynn is not sure what exactly he was going to order. Please advise. [place] Bing, CMA

## 2013-05-23 NOTE — Telephone Encounter (Signed)
Should have been set up for f/u ov in 6 weeks with pfts From ov 05/06/13

## 2013-06-08 ENCOUNTER — Encounter: Payer: Self-pay | Admitting: Internal Medicine

## 2013-06-17 ENCOUNTER — Encounter: Payer: Self-pay | Admitting: Adult Health

## 2013-06-17 ENCOUNTER — Encounter (INDEPENDENT_AMBULATORY_CARE_PROVIDER_SITE_OTHER): Payer: Self-pay

## 2013-06-17 ENCOUNTER — Ambulatory Visit (INDEPENDENT_AMBULATORY_CARE_PROVIDER_SITE_OTHER): Payer: Medicare Other | Admitting: Adult Health

## 2013-06-17 ENCOUNTER — Ambulatory Visit (HOSPITAL_COMMUNITY)
Admission: RE | Admit: 2013-06-17 | Discharge: 2013-06-17 | Disposition: A | Discharge status: Home / Self Care | Payer: Medicare Other | Source: Ambulatory Visit | Attending: Internal Medicine | Admitting: Internal Medicine

## 2013-06-17 ENCOUNTER — Ambulatory Visit: Payer: Medicare Other | Admitting: Internal Medicine

## 2013-06-17 VITALS — BP 106/64 | HR 69 | Temp 98.0°F | Ht 67.0 in | Wt 187.0 lb

## 2013-06-17 DIAGNOSIS — Z87891 Personal history of nicotine dependence: Secondary | ICD-10-CM | POA: Insufficient documentation

## 2013-06-17 DIAGNOSIS — J449 Chronic obstructive pulmonary disease, unspecified: Secondary | ICD-10-CM

## 2013-06-17 DIAGNOSIS — J4489 Other specified chronic obstructive pulmonary disease: Secondary | ICD-10-CM | POA: Insufficient documentation

## 2013-06-17 MED ORDER — ALBUTEROL SULFATE (2.5 MG/3ML) 0.083% IN NEBU
2.5000 mg | INHALATION_SOLUTION | Freq: Once | RESPIRATORY_TRACT | Status: AC
  Administered 2013-06-17: 2.5 mg via RESPIRATORY_TRACT

## 2013-06-17 MED ORDER — BUDESONIDE-FORMOTEROL FUMARATE 160-4.5 MCG/ACT IN AERO: 2.0000 | INHALATION_SPRAY | Freq: Two times a day (BID) | RESPIRATORY_TRACT | Status: DC

## 2013-06-17 NOTE — Patient Instructions (Addendum)
Begin Symbicort 160/4.60mcg 2 puffs Twice daily  , rinse after use.  Discuss with your family doctor or cardiologist regarding ACE inhibitor (Lisinopril) as it can play role in cough /wheezing .  Follow up Dr. Melvyn Novas  In 4 weeks and As needed

## 2013-06-20 LAB — PULMONARY FUNCTION TEST
DL/VA % pred: 54 %
DL/VA: 2.41 ml/min/mmHg/L
DLCO unc % pred: 50 %
DLCO unc: 14.32 ml/min/mmHg
FEF 25-75 POST: 2.48 L/s
FEF 25-75 PRE: 1.42 L/s
FEF2575-%Change-Post: 74 %
FEF2575-%PRED-POST: 105 %
FEF2575-%Pred-Pre: 60 %
FEV1-%Change-Post: 12 %
FEV1-%Pred-Post: 99 %
FEV1-%Pred-Pre: 88 %
FEV1-PRE: 2.65 L
FEV1-Post: 2.98 L
FEV1FVC-%Change-Post: 2 %
FEV1FVC-%PRED-PRE: 92 %
FEV6-%CHANGE-POST: 10 %
FEV6-%PRED-POST: 110 %
FEV6-%Pred-Pre: 100 %
FEV6-POST: 4.19 L
FEV6-Pre: 3.81 L
FEV6FVC-%CHANGE-POST: 0 %
FEV6FVC-%Pred-Post: 104 %
FEV6FVC-%Pred-Pre: 104 %
FVC-%CHANGE-POST: 9 %
FVC-%Pred-Post: 105 %
FVC-%Pred-Pre: 96 %
FVC-POST: 4.25 L
FVC-Pre: 3.89 L
PRE FEV1/FVC RATIO: 68 %
Post FEV1/FVC ratio: 70 %
Post FEV6/FVC ratio: 99 %
Pre FEV6/FVC Ratio: 98 %
RV % pred: 153 %
RV: 3.42 L
TLC % pred: 117 %
TLC: 7.53 L

## 2013-06-21 NOTE — Progress Notes (Signed)
   Subjective:    Patient ID: Juan Lynn, male    DOB: 11/24/1945  MRN: [id number]  HPI  57 ywom quit smoking 2014 p MI after dx of copd 2004 in [place] on prn saba but didn't need very  often referred 05/06/2013 to pulmonary clinic by Dr Drema Dallas.  05/06/2013 1st [facility] office visit/ Wert cc doe x 10 years indolent onset minimally progressive to only with heavy exertion which he avoids, no trouble with steps unless carrying more 25 lbs, not using any saba at all in last year.  >>xray   06/17/13 Follow up  MW pt here for PFT review.  reports breathing is doing well, no new complaints. PFT showed FEV1 88%, ratio 68 , 12% post BD , FVC 96%, DLCO 50%  patient does have some dyspnea on exertion . He feels that is mild in nature. He denies any chest pain, orthopnea, PND, or leg swelling.    Current Medications, Allergies, Complete Past Medical History, Past Surgical History, Family History, and Social History were reviewed in [employer] record.             Review of Systems  Constitutional: Negative for fever, chills, activity change, appetite change and unexpected weight change.  HENT: Negative for congestion, dental problem, postnasal drip, rhinorrhea, sneezing, sore throat, trouble swallowing and voice change.   Eyes: Negative for visual disturbance.  Respiratory: Positive for shortness of breath. Negative for cough and choking.   Cardiovascular: Negative for chest pain and leg swelling.  Gastrointestinal: Negative for nausea, vomiting and abdominal pain.  Genitourinary: Negative for difficulty urinating.  Musculoskeletal: Negative for arthralgias.  Skin: Negative for rash.  Psychiatric/Behavioral: Negative for behavioral problems and confusion.       Objective:   Physical Exam  amb mod anxious wm nad      HEENT mild turbinate edema.  Oropharynx no thrush or excess pnd or cobblestoning.  No JVD or cervical adenopathy. Mild accessory muscle  hypertrophy. Trachea midline, nl thryroid. Chest was hyperinflated by percussion with diminished breath sounds and moderate increased exp time without wheeze.  Regular rate and rhythm without murmur gallop or rub or increase P2 or edema.  Abd: no hsm, nl excursion. Ext warm without cyanosis or clubbing.      CXR  05/06/2013 : Hyperinflation. Stable emphysematous changes and chronic mild interstitial prominence. No active disease.      Assessment & Plan:

## 2013-06-21 NOTE — Assessment & Plan Note (Addendum)
Mild airflow obstruction ? Asthma component  Decreased diffusing capacity , consider CT chest in future as mild interstitial marking on cxr noted.  PFTs show  Preserved lung function w/ some reversibility with bronchodilator Would avoid ACE inhibitors if possible    Plan  Begin Symbicort 160/4.29mcg 2 puffs Twice daily  , rinse after use.  Discuss with your family doctor or cardiologist regarding ACE inhibitor (Lisinopril) as it can play role in cough /wheezing .  Follow up Dr. Melvyn Novas  In 4 weeks and As needed

## 2013-06-23 DIAGNOSIS — C61 Malignant neoplasm of prostate: Secondary | ICD-10-CM | POA: Diagnosis not present

## 2013-06-23 DIAGNOSIS — IMO0002 Reserved for concepts with insufficient information to code with codable children: Secondary | ICD-10-CM | POA: Diagnosis not present

## 2013-06-23 DIAGNOSIS — R972 Elevated prostate specific antigen [PSA]: Secondary | ICD-10-CM | POA: Diagnosis not present

## 2013-06-27 ENCOUNTER — Encounter: Payer: Self-pay | Admitting: Internal Medicine

## 2013-07-04 DIAGNOSIS — N401 Enlarged prostate with lower urinary tract symptoms: Secondary | ICD-10-CM | POA: Diagnosis not present

## 2013-07-04 DIAGNOSIS — K4090 Unilateral inguinal hernia, without obstruction or gangrene, not specified as recurrent: Secondary | ICD-10-CM | POA: Diagnosis not present

## 2013-07-04 DIAGNOSIS — N139 Obstructive and reflux uropathy, unspecified: Secondary | ICD-10-CM | POA: Diagnosis not present

## 2013-07-04 DIAGNOSIS — C61 Malignant neoplasm of prostate: Secondary | ICD-10-CM | POA: Diagnosis not present

## 2013-07-15 ENCOUNTER — Ambulatory Visit: Payer: Medicare Other | Admitting: Internal Medicine

## 2013-07-19 ENCOUNTER — Ambulatory Visit (INDEPENDENT_AMBULATORY_CARE_PROVIDER_SITE_OTHER): Payer: TRICARE For Life (TFL) | Admitting: General Surgery

## 2013-07-19 DIAGNOSIS — C61 Malignant neoplasm of prostate: Secondary | ICD-10-CM | POA: Diagnosis not present

## 2013-07-19 DIAGNOSIS — N139 Obstructive and reflux uropathy, unspecified: Secondary | ICD-10-CM | POA: Diagnosis not present

## 2013-07-19 DIAGNOSIS — N401 Enlarged prostate with lower urinary tract symptoms: Secondary | ICD-10-CM | POA: Diagnosis not present

## 2013-07-19 DIAGNOSIS — N138 Other obstructive and reflux uropathy: Secondary | ICD-10-CM | POA: Diagnosis not present

## 2013-07-21 ENCOUNTER — Ambulatory Visit (INDEPENDENT_AMBULATORY_CARE_PROVIDER_SITE_OTHER): Payer: TRICARE For Life (TFL) | Admitting: General Surgery

## 2013-07-28 ENCOUNTER — Encounter (INDEPENDENT_AMBULATORY_CARE_PROVIDER_SITE_OTHER): Payer: Self-pay | Admitting: General Surgery

## 2013-07-28 ENCOUNTER — Ambulatory Visit (INDEPENDENT_AMBULATORY_CARE_PROVIDER_SITE_OTHER): Payer: Medicare Other | Admitting: General Surgery

## 2013-07-28 ENCOUNTER — Other Ambulatory Visit (INDEPENDENT_AMBULATORY_CARE_PROVIDER_SITE_OTHER): Payer: Self-pay

## 2013-07-28 VITALS — BP 118/72 | HR 80 | Temp 98.0°F | Resp 20 | Ht 67.0 in | Wt 187.8 lb

## 2013-07-28 DIAGNOSIS — K4090 Unilateral inguinal hernia, without obstruction or gangrene, not specified as recurrent: Secondary | ICD-10-CM | POA: Insufficient documentation

## 2013-07-28 NOTE — Patient Instructions (Signed)
You have a reducible left inguinal hernia.  Since your symptoms have basically resolved, we have discussed that you have the option of proceeding with repair at this time or waiting until it bothers you more.  You have decided to wait until it begins to bother you more. That is reasonable from medical standpoint.  Please call Dr. Darrel Hoover office when you think it is time to do the surgery.     Inguinal Hernia, Adult Muscles help keep everything in the body in its proper place. But if a weak spot in the muscles develops, something can poke through. That is called a hernia. When this happens in the lower part of the belly (abdomen), it is called an inguinal hernia. (It takes its name from a part of the body in this region called the inguinal canal.) A weak spot in the wall of muscles lets some fat or part of the small intestine bulge through. An inguinal hernia can develop at any age. Men get them more often than women. CAUSES  In adults, an inguinal hernia develops over time.  It can be triggered by:  Suddenly straining the muscles of the lower abdomen.  Lifting heavy objects.  Straining to have a bowel movement. Difficult bowel movements (constipation) can lead to this.  Constant coughing. This may be caused by smoking or lung disease.  Being overweight.  Being pregnant.  Working at a job that requires long periods of standing or heavy lifting.  Having had an inguinal hernia before. One type can be an emergency situation. It is called a strangulated inguinal hernia. It develops if part of the small intestine slips through the weak spot and cannot get back into the abdomen. The blood supply can be cut off. If that happens, part of the intestine may die. This situation requires emergency surgery. SYMPTOMS  Often, a small inguinal hernia has no symptoms. It is found when a healthcare provider does a physical exam. Larger hernias usually have symptoms.   In adults, symptoms may  include:  A lump in the groin. This is easier to see when the person is standing. It might disappear when lying down.  In men, a lump in the scrotum.  Pain or burning in the groin. This occurs especially when lifting, straining or coughing.  A dull ache or feeling of pressure in the groin.  Signs of a strangulated hernia can include:  A bulge in the groin that becomes very painful and tender to the touch.  A bulge that turns red or purple.  Fever, nausea and vomiting.  Inability to have a bowel movement or to pass gas. DIAGNOSIS  To decide if you have an inguinal hernia, a healthcare provider will probably do a physical examination.  This will include asking questions about any symptoms you have noticed.  The healthcare provider might feel the groin area and ask you to cough. If an inguinal hernia is felt, the healthcare provider may try to slide it back into the abdomen.  Usually no other tests are needed. TREATMENT  Treatments can vary. The size of the hernia makes a difference. Options include:  Watchful waiting. This is often suggested if the hernia is small and you have had no symptoms.  No medical procedure will be done unless symptoms develop.  You will need to watch closely for symptoms. If any occur, contact your healthcare provider right away.  Surgery. This is used if the hernia is larger or you have symptoms.  Open surgery. This is  usually an outpatient procedure (you will not stay overnight in a hospital). An cut (incision) is made through the skin in the groin. The hernia is put back inside the abdomen. The weak area in the muscles is then repaired by herniorrhaphy or hernioplasty. Herniorrhaphy: in this type of surgery, the weak muscles are sewn back together. Hernioplasty: a patch or mesh is used to close the weak area in the abdominal wall.  Laparoscopy. In this procedure, a surgeon makes small incisions. A thin tube with a tiny video camera (called a  laparoscope) is put into the abdomen. The surgeon repairs the hernia with mesh by looking with the video camera and using two long instruments. HOME CARE INSTRUCTIONS   After surgery to repair an inguinal hernia:  You will need to take pain medicine prescribed by your healthcare provider. Follow all directions carefully.  You will need to take care of the wound from the incision.  Your activity will be restricted for awhile. This will probably include no heavy lifting for several weeks. You also should not do anything too active for a few weeks. When you can return to work will depend on the type of job that you have.  During "watchful waiting" periods, you should:  Maintain a healthy weight.  Eat a diet high in fiber (fruits, vegetables and whole grains).  Drink plenty of fluids to avoid constipation. This means drinking enough water and other liquids to keep your urine clear or pale yellow.  Do not lift heavy objects.  Do not stand for long periods of time.  Quit smoking. This should keep you from developing a frequent cough. SEEK MEDICAL CARE IF:   A bulge develops in your groin area.  You feel pain, a burning sensation or pressure in the groin. This might be worse if you are lifting or straining.  You develop a fever of more than 100.5 F (38.1 C). SEEK IMMEDIATE MEDICAL CARE IF:   Pain in the groin increases suddenly.  A bulge in the groin gets bigger suddenly and does not go down.  For men, there is sudden pain in the scrotum. Or, the size of the scrotum increases.  A bulge in the groin area becomes red or purple and is painful to touch.  You have nausea or vomiting that does not go away.  You feel your heart beating much faster than normal.  You cannot have a bowel movement or pass gas.  You develop a fever of more than 102.0 F (38.9 C). Document Released: 07/06/2008 Document Revised: 05/12/2011 Document Reviewed: 07/06/2008 [facility] Patient Information  2014 [place], [place].

## 2013-07-28 NOTE — Progress Notes (Signed)
Patient ID: Juan Lynn, male   DOB: 1946/02/03, 68 y.o.   MRN: [id number]  Chief Complaint  Patient presents with  . New Evaluation    eval [facility]    HPI Juan Lynn is a 68 y.o. male.  He is referred by Dr. Edward Jolly for evaluation of a left inguinal hernia. Dr. Reather Laurence is his PCP. He is followed by [facility] cardiology because of a history of MI and stent.  He recently underwent a prostate biopsy and was found to have low-grade prostate cancer. 4-6 weeks ago he was lifting a heavy box and felt a strain in his left groin had a lumen of pain. This has now resolved. He thinks is a small bulge initially but not now.No prior history of hernia  Morbidities include myocardial infarction with her neck And stent placement 06/25/2012. He is on SVN and aspirin. Quit tobacco use April 2014. BPH. Low-grade prostate cancer.  HPI  Past Medical History  Diagnosis Date  . BPH (benign prostatic hyperplasia)   . COPD (chronic obstructive pulmonary disease)   . OSA (obstructive sleep apnea)   . Tobacco abuse   . CAD (coronary artery disease) 06/25/2012    a. Inf STEMI s/p cath- DES-mid RCA b. Echo- EF 55%, basal inferior HK    Past Surgical History  Procedure Laterality Date  . Hernia repair    . Cataract extraction    . Coronary angioplasty with stent placement  06/25/2012    100% mid RCA s/p DES, 30% prox AVG Cx, mild luminal irregularities elsewhere; EF 55%    Family History  Problem Relation Age of Onset  . Heart attack Father   . CAD Father   . Emphysema Maternal Grandfather     smoked heavily   . Pancreatic cancer Father   . Stomach cancer Paternal Grandmother   . Prostate cancer Paternal Uncle     Social History History  Substance Use Topics  . Smoking status: Former Smoker -- 1.00 packs/day for 50 years    Types: Cigarettes    Quit date: 06/01/2012  . Smokeless tobacco: Never Used  . Alcohol Use: Yes     Comment: 3 beers daily    No Known Allergies  Current  Outpatient Prescriptions  Medication Sig Dispense Refill  . albuterol (PROAIR HFA) 108 (90 BASE) MCG/ACT inhaler Inhale 2 puffs into the lungs every 6 (six) hours as needed for wheezing or shortness of breath. 2 puffs every 4 hours as needed only  if your can't catch your breath  1 Inhaler  1  . aspirin 81 MG chewable tablet Chew 1 tablet (81 mg total) by mouth daily.      Marland Kitchen atorvastatin (LIPITOR) 80 MG tablet Take 1 tablet (80 mg total) by mouth daily at 6 PM.  90 tablet  3  . budesonide-formoterol (SYMBICORT) 160-4.5 MCG/ACT inhaler Inhale 2 puffs into the lungs 2 (two) times daily.  1 Inhaler    . finasteride (PROSCAR) 5 MG tablet Take 5 mg by mouth daily.      Marland Kitchen lisinopril (PRINIVIL,ZESTRIL) 2.5 MG tablet Take 1 tablet (2.5 mg total) by mouth daily.  90 tablet  3  . metoprolol tartrate (LOPRESSOR) 25 MG tablet Take 1 tablet (25 mg total) by mouth 2 (two) times daily.  180 tablet  3  . nitroGLYCERIN (NITROSTAT) 0.4 MG SL tablet Place 1 tablet (0.4 mg total) under the tongue every 5 (five) minutes as needed for chest pain.  25 tablet  0  .  prasugrel (EFFIENT) 10 MG TABS Take 1 tablet (10 mg total) by mouth daily.  90 tablet  3  . sildenafil (VIAGRA) 50 MG tablet Take 1 tablet (50 mg total) by mouth daily as needed for erectile dysfunction.  10 tablet  3  . tamsulosin (FLOMAX) 0.4 MG CAPS Take 0.4 mg by mouth 2 (two) times daily.       No current facility-administered medications for this visit.    Review of Systems Review of Systems  Constitutional: Negative for fever, chills and unexpected weight change.  HENT: Negative for congestion, hearing loss, sore throat, trouble swallowing and voice change.   Eyes: Negative for visual disturbance.  Respiratory: Negative for cough and wheezing.   Cardiovascular: Negative for chest pain, palpitations and leg swelling.  Gastrointestinal: Negative for nausea, vomiting, abdominal pain, diarrhea, constipation, blood in stool, abdominal distention, anal  bleeding and rectal pain.  Genitourinary: Negative for hematuria and difficulty urinating.  Musculoskeletal: Negative for arthralgias.  Skin: Negative for rash and wound.  Neurological: Negative for seizures, syncope, weakness and headaches.  Hematological: Negative for adenopathy. Does not bruise/bleed easily.  Psychiatric/Behavioral: Negative for confusion.    Blood pressure 118/72, pulse 80, temperature 98 F (36.7 C), temperature source Oral, resp. rate 20, height 5\' 7"  (1.702 m), weight 187 lb 12.8 oz (85.186 kg).  Physical Exam Physical Exam  Constitutional: He is oriented to person, place, and time. He appears well-developed and well-nourished. No distress.  HENT:  Head: Normocephalic.  Nose: Nose normal.  Mouth/Throat: No oropharyngeal exudate.  Eyes: Conjunctivae and EOM are normal. Pupils are equal, round, and reactive to light. Right eye exhibits no discharge. Left eye exhibits no discharge. No scleral icterus.  Neck: Normal range of motion. Neck supple. No JVD present. No tracheal deviation present. No thyromegaly present.  Cardiovascular: Normal rate, regular rhythm, normal heart sounds and intact distal pulses.   No murmur heard. Pulmonary/Chest: Effort normal and breath sounds normal. No stridor. No respiratory distress. He has no wheezes. He has no rales. He exhibits no tenderness.  Abdominal: Soft. Bowel sounds are normal. He exhibits no distension and no mass. There is no tenderness. There is no rebound and no guarding.  Genitourinary:  He has a small left inguinal hernia that is detectable standing. Very little bulge. No evidence of hernia on the right. Umbilicus normal. Penis scrotum and testes normal.  Musculoskeletal: Normal range of motion. He exhibits no edema and no tenderness.  Lymphadenopathy:    He has no cervical adenopathy.  Neurological: He is alert and oriented to person, place, and time. He has normal reflexes. Coordination normal.  Skin: Skin is warm  and dry. No rash noted. He is not diaphoretic. No erythema. No pallor.  Psychiatric: He has a normal mood and affect. His behavior is normal. Judgment and thought content normal.    Data Reviewed Dr. Cy Blamer office notes  Assessment    Small left inguinal hernia. Essentially asymptomatic male but previously symptomatic.  History myocardial infarction with catheterization and stent placement, on F. And and aspirin  Low-grade prostate cancer  Prior tobacco abuse, abstinent for 1 year     Plan    I described the anatomy and natural history of her hernia. I described open and laparoscopic techniques. I told him that repair was elective and it could be considered at any time or he could wait until he had more symptoms.  It was his  decision to wait until he has more symptoms. I encouraged him to  call me when he feels like it is time to go ahead with the surgery.  He is aware of the low but definite risk of incarceration and strangulation.        Edsel Petrin. Dalbert Batman, M.D., [facility] General and Minimally invasive Surgery Breast and Colorectal Surgery Office:   [phone] Pager:   [phone]  07/28/2013, 9:08 AM

## 2013-08-02 ENCOUNTER — Ambulatory Visit (INDEPENDENT_AMBULATORY_CARE_PROVIDER_SITE_OTHER): Payer: Medicare Other | Admitting: Cardiovascular Disease

## 2013-08-02 ENCOUNTER — Encounter: Payer: Self-pay | Admitting: Cardiovascular Disease

## 2013-08-02 VITALS — BP 118/70 | HR 59 | Ht 67.0 in | Wt 186.0 lb

## 2013-08-02 DIAGNOSIS — F17200 Nicotine dependence, unspecified, uncomplicated: Secondary | ICD-10-CM | POA: Diagnosis not present

## 2013-08-02 DIAGNOSIS — I2510 Atherosclerotic heart disease of native coronary artery without angina pectoris: Secondary | ICD-10-CM

## 2013-08-02 DIAGNOSIS — Z720 Tobacco use: Secondary | ICD-10-CM

## 2013-08-02 MED ORDER — CLOPIDOGREL BISULFATE 75 MG PO TABS: 75.0000 mg | ORAL_TABLET | Freq: Every day | ORAL | Status: DC

## 2013-08-02 MED ORDER — ATORVASTATIN CALCIUM 80 MG PO TABS: 80.0000 mg | ORAL_TABLET | Freq: Every day | ORAL | Status: DC

## 2013-08-02 MED ORDER — METOPROLOL TARTRATE 25 MG PO TABS: 25.0000 mg | ORAL_TABLET | Freq: Two times a day (BID) | ORAL | Status: DC

## 2013-08-02 MED ORDER — NITROGLYCERIN 0.4 MG SL SUBL: 0.4000 mg | SUBLINGUAL_TABLET | SUBLINGUAL | Status: DC | PRN

## 2013-08-02 NOTE — Patient Instructions (Addendum)
Your physician wants you to follow-up in:  12 months.  You will receive a reminder letter in the mail two months in advance. If you don't receive a letter, please call our office to schedule the follow-up appointment.   Your physician has recommended you make the following change in your medication:   Stop Lisinopril. Stop Effient. Start Clopidogrel 75 mg by mouth daily.

## 2013-08-02 NOTE — Progress Notes (Signed)
History of Present Illness: 68 yo male with history of CAD s/p STEMI 06/25/12 with occluded RCA s/p DES x 1 mid RCA, COPD, tobacco abuse here today for cardiac follow up. He was admitted to [facility] 06/25/12 with an inferior STEMI, found to have occluded mid RCA treated with DES x 1 mid RCA. He has residual 30% proximal circumflex stenosis, mild luminal irregularities elsewhere, LVEF= 55%. 2-D echo showed EF of 55% with basal inferior wall hypokinesis. Patient did well post MI and quit smoking.   He is here today for follow up. He denies any chest pain, palpitations, dizziness, or presyncope. He does have emphysema and has chronic dyspnea on exertion. He is active. He has been smoking occasionally.   Primary Care Physician: Leighton Ruff  Last Lipid Profile:Lipid Panel     Component Value Date/Time   CHOL 119 08/11/2012 0924   TRIG 82.0 08/11/2012 0924   HDL 51.70 08/11/2012 0924   CHOLHDL 2 08/11/2012 0924   VLDL 16.4 08/11/2012 0924   LDLCALC 51 08/11/2012 0924    Past Medical History  Diagnosis Date  . BPH (benign prostatic hyperplasia)   . COPD (chronic obstructive pulmonary disease)   . OSA (obstructive sleep apnea)   . Tobacco abuse   . CAD (coronary artery disease) 06/25/2012    a. Inf STEMI s/p cath- DES-mid RCA b. Echo- EF 55%, basal inferior HK    Past Surgical History  Procedure Laterality Date  . Hernia repair    . Cataract extraction    . Coronary angioplasty with stent placement  06/25/2012    100% mid RCA s/p DES, 30% prox AVG Cx, mild luminal irregularities elsewhere; EF 55%    Current Outpatient Prescriptions  Medication Sig Dispense Refill  . albuterol (PROAIR HFA) 108 (90 BASE) MCG/ACT inhaler Inhale 2 puffs into the lungs every 6 (six) hours as needed for wheezing or shortness of breath. 2 puffs every 4 hours as needed only  if your can't catch your breath  1 Inhaler  1  . aspirin 81 MG chewable tablet Chew 1 tablet (81 mg total) by mouth daily.      Marland Kitchen  atorvastatin (LIPITOR) 80 MG tablet Take 1 tablet (80 mg total) by mouth daily at 6 PM.  90 tablet  3  . budesonide-formoterol (SYMBICORT) 160-4.5 MCG/ACT inhaler Inhale 2 puffs into the lungs 2 (two) times daily.  1 Inhaler    . finasteride (PROSCAR) 5 MG tablet Take 5 mg by mouth daily.      Marland Kitchen lisinopril (PRINIVIL,ZESTRIL) 2.5 MG tablet Take 1 tablet (2.5 mg total) by mouth daily.  90 tablet  3  . metoprolol tartrate (LOPRESSOR) 25 MG tablet Take 1 tablet (25 mg total) by mouth 2 (two) times daily.  180 tablet  3  . nitroGLYCERIN (NITROSTAT) 0.4 MG SL tablet Place 1 tablet (0.4 mg total) under the tongue every 5 (five) minutes as needed for chest pain.  25 tablet  0  . prasugrel (EFFIENT) 10 MG TABS Take 1 tablet (10 mg total) by mouth daily.  90 tablet  3  . sildenafil (VIAGRA) 50 MG tablet Take 1 tablet (50 mg total) by mouth daily as needed for erectile dysfunction.  10 tablet  3  . tamsulosin (FLOMAX) 0.4 MG CAPS Take 0.4 mg by mouth 2 (two) times daily.       No current facility-administered medications for this visit.    No Known Allergies  History   Social History  . Marital  Status: Divorced    Spouse Name: N/A    Number of Children: 2  . Years of Education: N/A   Occupational History  . Retired    Social History Main Topics  . Smoking status: Former Smoker -- 1.00 packs/day for 50 years    Types: Cigarettes    Quit date: 06/01/2012  . Smokeless tobacco: Never Used  . Alcohol Use: Yes     Comment: 3 beers daily  . Drug Use: No  . Sexual Activity: Not on file   Other Topics Concern  . Not on file   Social History Narrative   Patient lives with his daughter    Family History  Problem Relation Age of Onset  . Heart attack Father   . CAD Father   . Emphysema Maternal Grandfather     smoked heavily   . Pancreatic cancer Father   . Stomach cancer Paternal Grandmother   . Prostate cancer Paternal Uncle     Review of Systems:  As stated in the HPI and otherwise  negative.   BP 118/70  Pulse 59  Ht 5\' 7"  (1.702 m)  Wt 186 lb (84.369 kg)  BMI 29.12 kg/m2  Physical Examination: General: Well developed, well nourished, NAD HEENT: OP clear, mucus membranes moist SKIN: warm, dry. No rashes. Neuro: No focal deficits Musculoskeletal: Muscle strength 5/5 all ext Psychiatric: Mood and affect normal Neck: No JVD, no carotid bruits, no thyromegaly, no lymphadenopathy. Lungs:Clear bilaterally, no wheezes, rhonci, crackles Cardiovascular: Regular rate and rhythm. No murmurs, gallops or rubs. Abdomen:Soft. Bowel sounds present. Non-tender.  Extremities: No lower extremity edema. Pulses are 2 + in the bilateral DP/PT.  EKG: Sinus brady, rate 59 bpm. LVH.  Assessment and Plan:   1. CAD: Doing well. Will change Effient to Plavix. Continue ASA. Based on recent findings in DAPT trial, continue dual anti-platelet therapy. Continue statin, beta blocker. Will stop Ace-inhibitor as he has discussed this with Dr. Melvyn Novas and wishes to stop. LV function is normal. BP and lipids are controlled.   2. Tobacco abuse: He has stopped smoking after recent relapse.

## 2013-09-12 DIAGNOSIS — J40 Bronchitis, not specified as acute or chronic: Secondary | ICD-10-CM | POA: Diagnosis not present

## 2013-09-12 DIAGNOSIS — J329 Chronic sinusitis, unspecified: Secondary | ICD-10-CM | POA: Diagnosis not present

## 2013-09-19 DIAGNOSIS — C61 Malignant neoplasm of prostate: Secondary | ICD-10-CM | POA: Diagnosis not present

## 2013-09-26 ENCOUNTER — Telehealth: Payer: Self-pay | Admitting: Cardiovascular Disease

## 2013-09-26 NOTE — Telephone Encounter (Signed)
Pt called to see if he needs antibiotics prior dental work . Pt had stent placement more than a year ago. Pt is aware that he does not needs antibiotic prior this procedure. Pt verbalized understanding.

## 2013-09-26 NOTE — Telephone Encounter (Signed)
New message    Has questions regarding pre-med before dental work.

## 2013-09-28 DIAGNOSIS — F518 Other sleep disorders not due to a substance or known physiological condition: Secondary | ICD-10-CM | POA: Diagnosis not present

## 2013-09-28 DIAGNOSIS — G4733 Obstructive sleep apnea (adult) (pediatric): Secondary | ICD-10-CM | POA: Diagnosis not present

## 2013-10-12 DIAGNOSIS — C61 Malignant neoplasm of prostate: Secondary | ICD-10-CM | POA: Diagnosis not present

## 2013-10-25 DIAGNOSIS — C61 Malignant neoplasm of prostate: Secondary | ICD-10-CM | POA: Diagnosis not present

## 2013-10-31 DIAGNOSIS — N401 Enlarged prostate with lower urinary tract symptoms: Secondary | ICD-10-CM | POA: Diagnosis not present

## 2013-10-31 DIAGNOSIS — N139 Obstructive and reflux uropathy, unspecified: Secondary | ICD-10-CM | POA: Diagnosis not present

## 2013-10-31 DIAGNOSIS — C61 Malignant neoplasm of prostate: Secondary | ICD-10-CM | POA: Diagnosis not present

## 2013-11-16 DIAGNOSIS — I252 Old myocardial infarction: Secondary | ICD-10-CM | POA: Diagnosis not present

## 2013-11-16 DIAGNOSIS — I2510 Atherosclerotic heart disease of native coronary artery without angina pectoris: Secondary | ICD-10-CM | POA: Diagnosis not present

## 2013-11-16 DIAGNOSIS — E7800 Pure hypercholesterolemia, unspecified: Secondary | ICD-10-CM | POA: Diagnosis not present

## 2013-11-16 DIAGNOSIS — Z79899 Other long term (current) drug therapy: Secondary | ICD-10-CM | POA: Diagnosis not present

## 2013-11-16 DIAGNOSIS — C61 Malignant neoplasm of prostate: Secondary | ICD-10-CM | POA: Diagnosis not present

## 2014-01-20 DIAGNOSIS — H2512 Age-related nuclear cataract, left eye: Secondary | ICD-10-CM | POA: Diagnosis not present

## 2014-02-09 ENCOUNTER — Encounter (HOSPITAL_COMMUNITY): Payer: Self-pay | Admitting: Cardiovascular Disease

## 2014-02-13 DIAGNOSIS — Z86010 Personal history of colonic polyps: Secondary | ICD-10-CM | POA: Diagnosis not present

## 2014-02-13 DIAGNOSIS — Z5181 Encounter for therapeutic drug level monitoring: Secondary | ICD-10-CM | POA: Diagnosis not present

## 2014-03-06 ENCOUNTER — Encounter: Payer: Self-pay | Admitting: Cardiovascular Disease

## 2014-03-06 ENCOUNTER — Telehealth: Payer: Self-pay | Admitting: Cardiovascular Disease

## 2014-03-06 NOTE — Telephone Encounter (Signed)
Letter faxed to Dr Amedeo Plenty office.

## 2014-03-06 NOTE — Telephone Encounter (Signed)
Letter has been written. Can we fax to the GI doctor? Thanks, chris

## 2014-03-06 NOTE — Telephone Encounter (Signed)
New message      Request for surgical clearance:  1. What type of surgery is being performed? colonoscopy   2. When is this surgery scheduled? 03-15-14   3. Are there any medications that need to be held prior to surgery and how long?plavix  Whenever doctor said hold it   4. Name of physician performing surgery? Dr Amedeo Plenty   5. What is your office phone and fax number? Phone [phone]  Fax [phone]

## 2014-03-07 NOTE — Telephone Encounter (Signed)
Received direct call from [facility] at Dr. Amedeo Plenty' office [facility]).  She has not received letter faxed yesterday. I told her pt could stop Plavix 7 days prior to colonoscopy. Will have medical records refax letter. I confirmed fax number --[phone].  Letter given to medical records to refax.

## 2014-03-15 DIAGNOSIS — Z86010 Personal history of colonic polyps: Secondary | ICD-10-CM | POA: Diagnosis not present

## 2014-03-15 DIAGNOSIS — D123 Benign neoplasm of transverse colon: Secondary | ICD-10-CM | POA: Diagnosis not present

## 2014-03-15 DIAGNOSIS — K5730 Diverticulosis of large intestine without perforation or abscess without bleeding: Secondary | ICD-10-CM | POA: Diagnosis not present

## 2014-03-15 DIAGNOSIS — D128 Benign neoplasm of rectum: Secondary | ICD-10-CM | POA: Diagnosis not present

## 2014-03-15 DIAGNOSIS — Z09 Encounter for follow-up examination after completed treatment for conditions other than malignant neoplasm: Secondary | ICD-10-CM | POA: Diagnosis not present

## 2014-03-15 DIAGNOSIS — D126 Benign neoplasm of colon, unspecified: Secondary | ICD-10-CM | POA: Diagnosis not present

## 2014-04-24 DIAGNOSIS — C61 Malignant neoplasm of prostate: Secondary | ICD-10-CM | POA: Diagnosis not present

## 2014-05-01 DIAGNOSIS — C61 Malignant neoplasm of prostate: Secondary | ICD-10-CM | POA: Diagnosis not present

## 2014-05-01 DIAGNOSIS — R3912 Poor urinary stream: Secondary | ICD-10-CM | POA: Diagnosis not present

## 2014-05-01 DIAGNOSIS — N401 Enlarged prostate with lower urinary tract symptoms: Secondary | ICD-10-CM | POA: Diagnosis not present

## 2014-05-17 ENCOUNTER — Telehealth: Payer: Self-pay | Admitting: Internal Medicine

## 2014-05-17 MED ORDER — BUDESONIDE-FORMOTEROL FUMARATE 160-4.5 MCG/ACT IN AERO: 2.0000 | INHALATION_SPRAY | Freq: Two times a day (BID) | RESPIRATORY_TRACT | Status: DC

## 2014-05-17 MED ORDER — ALBUTEROL SULFATE HFA 108 (90 BASE) MCG/ACT IN AERS: 2.0000 | INHALATION_SPRAY | Freq: Four times a day (QID) | RESPIRATORY_TRACT | Status: DC | PRN

## 2014-05-17 NOTE — Telephone Encounter (Signed)
Pt needs refills on Ventolin and Symbicort. Advised him that he needs a ROV soon. ROV has been scheduled with MW on 05/23/14 at 3:15pm. Rx will be sent in.

## 2014-05-23 ENCOUNTER — Encounter: Payer: Self-pay | Admitting: Internal Medicine

## 2014-05-23 ENCOUNTER — Ambulatory Visit (INDEPENDENT_AMBULATORY_CARE_PROVIDER_SITE_OTHER)
Admission: RE | Admit: 2014-05-23 | Discharge: 2014-05-23 | Disposition: A | Discharge status: Home / Self Care | Payer: Medicare Other | Source: Ambulatory Visit | Attending: Internal Medicine | Admitting: Internal Medicine

## 2014-05-23 ENCOUNTER — Ambulatory Visit (INDEPENDENT_AMBULATORY_CARE_PROVIDER_SITE_OTHER): Payer: Medicare Other | Admitting: Internal Medicine

## 2014-05-23 DIAGNOSIS — J449 Chronic obstructive pulmonary disease, unspecified: Secondary | ICD-10-CM

## 2014-05-23 MED ORDER — FLUTICASONE-SALMETEROL 115-21 MCG/ACT IN AERO: 2.0000 | INHALATION_SPRAY | Freq: Two times a day (BID) | RESPIRATORY_TRACT | Status: DC

## 2014-05-23 NOTE — Progress Notes (Signed)
Subjective:    Patient ID: Juan Lynn, male    DOB: 10/07/1945  MRN: [id number]    Brief patient profile:  19 ywom quit smoking 2014 p MI after dx of copd 2004 in [place] on prn saba but didn't need very  often referred 05/06/2013 to pulmonary clinic by Dr Marlane Mingle with a classic Point with reversibility.  History of Present Illness  05/06/2013 1st [facility] office visit/ Juan Lynn cc doe x 10 years indolent onset minimally progressive to only with heavy exertion which he avoids, no trouble with steps unless carrying more 25 lbs, not using any saba at all in last year.  >>xray   06/17/13 Follow up  MW pt here for PFT review.  reports breathing is doing well, no new complaints. PFT showed FEV1 88%, ratio 68 , 12% post BD , FVC 96%, DLCO 50%  patient does have some dyspnea on exertion . He feels that is mild in nature.  rec Begin Symbicort 160/4.33mcg 2 puffs Twice daily  , rinse after use.  Discuss with your family doctor or cardiologist regarding ACE inhibitor (Lisinopril) as it can play role in cough /wheezing > d/c'd .   05/23/2014 f/u ov/Juan Lynn re: GOLD I COPD : sob/ steps and bending over does ok on with work out = stepper p symbicort 160 2 puffs in am / 1 pm  Chief Complaint  Patient presents with  . Follow-up    COPD -- Pt denies any breathing issues. Pt reports some voice changes.   insurance requesting trial of advair before they will pay for symbicort    No obvious day to day or daytime variabilty or assoc chronic cough or cp or chest tightness, subjective wheeze overt sinus or hb symptoms. No unusual exp hx or h/o childhood pna/ asthma or knowledge of premature birth.  Sleeping ok without nocturnal  or early am exacerbation  of respiratory  c/o's or need for noct saba. Also denies any obvious fluctuation of symptoms with weather or environmental changes or other aggravating or alleviating factors except as outlined above   Current Medications, Allergies, Complete Past  Medical History, Past Surgical History, Family History, and Social History were reviewed in [employer] record.  ROS  The following are not active complaints unless bolded sore throat, dysphagia, dental problems, itching, sneezing,  nasal congestion or excess/ purulent secretions, ear ache,   fever, chills, sweats, unintended wt loss, pleuritic or exertional cp, hemoptysis,  orthopnea pnd or leg swelling, presyncope, palpitations, heartburn, abdominal pain, anorexia, nausea, vomiting, diarrhea  or change in bowel or urinary habits, change in stools or urine, dysuria,hematuria,  rash, arthralgias, visual complaints, headache, numbness weakness or ataxia or problems with walking or coordination,  change in mood/affect or memory.                     Objective:   Physical Exam  amb mod anxious wm nad    . Wt Readings from Last 3 Encounters:  05/23/14 194 lb 12.8 oz (88.361 kg)  08/02/13 186 lb (84.369 kg)  07/28/13 187 lb 12.8 oz (85.186 kg)    Vital signs reviewed      HEENT mild turbinate edema.  Oropharynx no thrush or excess pnd or cobblestoning.  No JVD or cervical adenopathy. Mild accessory muscle hypertrophy. Trachea midline, nl thryroid. Chest was hyperinflated by percussion with diminished breath sounds and moderate increased exp time without wheeze.  Regular rate and rhythm without murmur gallop or  rub or increase P2 or edema.  Abd: no hsm, nl excursion. Ext warm without cyanosis or clubbing.      .CXR PA and Lateral:   05/23/2014 :     I personally reviewed images and agree with radiology impression as follows:     Hyperinflated lungs with basilar bronchitic markings and bullous change. No acute findings.      Assessment & Plan:

## 2014-05-23 NOTE — Assessment & Plan Note (Addendum)
06/17/13 PFT showed FEV1 88%, ratio 68 , 12% post BD , FVC 96%, DLCO 50%  I had an extended discussion with the patient reviewing all relevant studies completed to date and  lasting 15 to 20 minutes of a 25 minute visit on the following ongoing concerns:  1) He only barely has GOLD I copd but did respond some to saba so reasonable to continue laba/ics bid  2) closest choice to symbicort 160 is advair 115 2bid   3) The proper method of use, as well as anticipated side effects, of a metered-dose inhaler are discussed and demonstrated to the patient. Improved effectiveness after extensive coaching during this visit to a level of approximately  75%  4) Each maintenance medication was reviewed in detail including most importantly the difference between maintenance and as needed and under what circumstances the prns are to be used.  Please see instructions for details which were reviewed in writing and the patient given a copy.

## 2014-05-23 NOTE — Patient Instructions (Signed)
You only have a GOLD I severity   Start advair 115 /21 Take 2 puffs first thing in am and then another 2 puffs about 12 hours later.   Only use your albuterol as a rescue medication to be used if you can't catch your breath by resting or doing a relaxed purse lip breathing pattern.  - The less you use it, the better it will work when you need it. - Ok to use up to 2 puffs  every 4 hours if you must but call for immediate appointment if use goes up over your usual need - Don't leave home without it !!  (think of it like the spare tire for your car)    If you are satisfied with your treatment plan,  let your doctor know and he/she can either refill your medications or you can return here when your prescription runs out.     If in any way you are not 100% satisfied,  please tell us.  If 100% better, tell your friends!  Pulmonary follow up is as needed

## 2014-05-24 NOTE — Progress Notes (Signed)
Quick Note:  Spoke with pt and notified of results per Dr. Wert. Pt verbalized understanding and denied any questions.  ______ 

## 2014-08-23 ENCOUNTER — Other Ambulatory Visit: Payer: Self-pay | Admitting: *Deleted

## 2014-08-23 MED ORDER — CLOPIDOGREL BISULFATE 75 MG PO TABS: 75.0000 mg | ORAL_TABLET | Freq: Every day | ORAL | Status: DC

## 2014-09-01 DIAGNOSIS — D291 Benign neoplasm of prostate: Secondary | ICD-10-CM | POA: Diagnosis not present

## 2014-09-01 DIAGNOSIS — C61 Malignant neoplasm of prostate: Secondary | ICD-10-CM | POA: Diagnosis not present

## 2014-09-08 DIAGNOSIS — C61 Malignant neoplasm of prostate: Secondary | ICD-10-CM | POA: Diagnosis not present

## 2014-09-08 DIAGNOSIS — N138 Other obstructive and reflux uropathy: Secondary | ICD-10-CM | POA: Diagnosis not present

## 2014-09-08 DIAGNOSIS — N401 Enlarged prostate with lower urinary tract symptoms: Secondary | ICD-10-CM | POA: Diagnosis not present

## 2014-09-25 ENCOUNTER — Encounter: Payer: Self-pay | Admitting: Physician Assistant

## 2014-09-25 ENCOUNTER — Ambulatory Visit (INDEPENDENT_AMBULATORY_CARE_PROVIDER_SITE_OTHER): Payer: Medicare Other | Admitting: Physician Assistant

## 2014-09-25 VITALS — BP 122/72 | HR 68 | Ht 67.0 in | Wt 192.0 lb

## 2014-09-25 DIAGNOSIS — E785 Hyperlipidemia, unspecified: Secondary | ICD-10-CM | POA: Diagnosis not present

## 2014-09-25 DIAGNOSIS — I2510 Atherosclerotic heart disease of native coronary artery without angina pectoris: Secondary | ICD-10-CM

## 2014-09-25 MED ORDER — CLOPIDOGREL BISULFATE 75 MG PO TABS: 75.0000 mg | ORAL_TABLET | Freq: Every day | ORAL | Status: DC

## 2014-09-25 MED ORDER — ATORVASTATIN CALCIUM 80 MG PO TABS: 80.0000 mg | ORAL_TABLET | Freq: Every day | ORAL | Status: DC

## 2014-09-25 MED ORDER — NITROGLYCERIN 0.4 MG SL SUBL: 0.4000 mg | SUBLINGUAL_TABLET | SUBLINGUAL | Status: DC | PRN

## 2014-09-25 MED ORDER — METOPROLOL TARTRATE 25 MG PO TABS: 25.0000 mg | ORAL_TABLET | Freq: Two times a day (BID) | ORAL | Status: DC

## 2014-09-25 NOTE — Progress Notes (Signed)
Cardiology Office Note   Date:  09/25/2014   ID:  Juan Lynn, DOB 1945/06/11, MRN [id number]  PCP:  Gerrit Heck, MD  Cardiologist: Dr. Angelena Form  Chief Complaint: 1 yr f/u    History of Present Illness: Juan Lynn is a 69 y.o. male who presents for one-year follow-up. He has a history of CAD status post STEMI 06/25/12 treated with DES to the mid RCA. He has residual 30% circumflex stenosis and mild luminal irregularities elsewhere. EF 55%. 2-D echo EF 55% with basal inferior wall hypokinesis. Patient quit smoking after his MI.  Patient is here for his yearly follow-up. He began exercising 6 months ago by doing cardio 1 mile 3 days a week and lifting weights. He has lost about 5 pounds but is feeling better. He is followed by Dr. Melvyn Novas for his COPD.   Past Medical History  Diagnosis Date  . BPH (benign prostatic hyperplasia)   . COPD (chronic obstructive pulmonary disease)   . OSA (obstructive sleep apnea)   . Tobacco abuse   . CAD (coronary artery disease) 06/25/2012    a. Inf STEMI s/p cath- DES-mid RCA b. Echo- EF 55%, basal inferior HK    Past Surgical History  Procedure Laterality Date  . Hernia repair    . Cataract extraction    . Coronary angioplasty with stent placement  06/25/2012    100% mid RCA s/p DES, 30% prox AVG Cx, mild luminal irregularities elsewhere; EF 55%  . Left heart catheterization with coronary angiogram N/A 06/25/2012    Procedure: LEFT HEART CATHETERIZATION WITH CORONARY ANGIOGRAM;  Surgeon: Burnell Blanks, MD;  Location: [facility] CATH LAB;  Service: Cardiovascular;  Laterality: N/A;  . Percutaneous coronary stent intervention (pci-s)  06/25/2012    Procedure: PERCUTANEOUS CORONARY STENT INTERVENTION (PCI-S);  Surgeon: Burnell Blanks, MD;  Location: [facility] CATH LAB;  Service: Cardiovascular;;     Current Outpatient Prescriptions  Medication Sig Dispense Refill  . albuterol (PROVENTIL HFA;VENTOLIN HFA) 108 (90 BASE) MCG/ACT  inhaler Inhale 2 puffs into the lungs every 6 (six) hours as needed for wheezing or shortness of breath. 3 Inhaler 0  . aspirin 81 MG chewable tablet Chew 1 tablet (81 mg total) by mouth daily.    Marland Kitchen atorvastatin (LIPITOR) 80 MG tablet Take 1 tablet (80 mg total) by mouth at bedtime. 90 tablet 3  . clopidogrel (PLAVIX) 75 MG tablet Take 1 tablet (75 mg total) by mouth daily. 90 tablet 0  . finasteride (PROSCAR) 5 MG tablet Take 5 mg by mouth daily.    . fluticasone-salmeterol (ADVAIR HFA) 115-21 MCG/ACT inhaler Inhale 2 puffs into the lungs 2 (two) times daily. 3 Inhaler 3  . metoprolol tartrate (LOPRESSOR) 25 MG tablet Take 1 tablet (25 mg total) by mouth 2 (two) times daily. 180 tablet 3  . nitroGLYCERIN (NITROSTAT) 0.4 MG SL tablet Place 1 tablet (0.4 mg total) under the tongue every 5 (five) minutes as needed for chest pain. 25 tablet 6  . sildenafil (VIAGRA) 50 MG tablet Take 1 tablet (50 mg total) by mouth daily as needed for erectile dysfunction. 10 tablet 3  . tamsulosin (FLOMAX) 0.4 MG CAPS Take 0.4 mg by mouth 2 (two) times daily.     No current facility-administered medications for this visit.    Allergies:   Review of patient's allergies indicates no known allergies.    Social History:  The patient  reports that he quit smoking about 2 years ago. His smoking use included Cigarettes. He has  a 50 pack-year smoking history. He has never used smokeless tobacco. He reports that he drinks alcohol. He reports that he does not use illicit drugs.   Family History:  The patient'sfamily history includes CAD in his father; Emphysema in his maternal grandfather; Heart attack in his father; Pancreatic cancer in his father; Prostate cancer in his paternal uncle; Stomach cancer in his paternal grandmother.    ROS:  Please see the history of present illness.   Otherwise, review of systems are positive for wheezing controlled with Advair.   All other systems are reviewed and negative.    PHYSICAL  EXAM: VS:  BP 122/72 mmHg  Pulse 68  Ht 5\' 7"  (1.702 m)  Wt 192 lb (87.091 kg)  BMI 30.06 kg/m2 , BMI Body mass index is 30.06 kg/(m^2). GEN: Well nourished, well developed, in no acute distress Neck: no JVD, HJR, carotid bruits, or masses Cardiac: RRR; no murmurs,gallop, rubs, thrill or heave,  Respiratory:  clear to auscultation bilaterally, normal work of breathing GI: soft, nontender, nondistended, + BS MS: no deformity or atrophy Extremities: without cyanosis, clubbing, edema, good distal pulses bilaterally.  Skin: warm and dry, no rash Neuro:  Strength and sensation are intact    EKG:  EKG is ordered today. The ekg ordered today demonstrates normal sinus rhythm with inferior Q waves, no acute change   Recent Labs: No results found for requested labs within last 365 days.    Lipid Panel    Component Value Date/Time   CHOL 119 08/11/2012 0924   TRIG 82.0 08/11/2012 0924   HDL 51.70 08/11/2012 0924   CHOLHDL 2 08/11/2012 0924   VLDL 16.4 08/11/2012 0924   LDLCALC 51 08/11/2012 0924      Wt Readings from Last 3 Encounters:  09/25/14 192 lb (87.091 kg)  05/23/14 194 lb 12.8 oz (88.361 kg)  08/02/13 186 lb (84.369 kg)      Other studies Reviewed: Additional studies/ records that were reviewed today include and review of the records demonstrates:  Labs reviewed and lipids haven't been drawn since 2014.   ASSESSMENT AND PLAN: CAD (coronary artery disease), native coronary artery Patient is doing well since his MI. He has no chest pain or cardiac symptoms. Continue metoprolol, Plavix, aspirin and nitroglycerin. Follow-up with Dr.McAlhany in 1 yr.  Hyperlipidemia Patient hasn't has cholesterol checked since 2014. He already ate today. He has to schedule follow-up for full labs with Dr. Drema Dallas. He would like to just at his cholesterol to that. I've asked him to send [place] a copy of his labs. Continue Lipitor.     Sumner Boast, PA-C  09/25/2014 11:30 AM     [facility] [address], [place], [place]  [address] Phone: [phone]; Fax: [phone]

## 2014-09-25 NOTE — Assessment & Plan Note (Signed)
Patient is doing well since his MI. He has no chest pain or cardiac symptoms. Continue metoprolol, Plavix, aspirin and nitroglycerin. Follow-up with Dr.McAlhany in 1 yr.

## 2014-09-25 NOTE — Patient Instructions (Addendum)
Medication Instructions:  Lipitor,Plavix, Nitro and Lopressor all sent into [facility] today  Labwork: Will do lipid panel at Dr. Drema Dallas office  Testing/Procedures: -None  Follow-Up: Your physician wants you to follow-up in: one year with Dr. Angelena Form.  You will receive a reminder letter in the mail two months in advance. If you don't receive a letter, please call our office to schedule the follow-up appointment.   Any Other Special Instructions Will Be Listed Below (If Applicable).

## 2014-09-25 NOTE — Assessment & Plan Note (Signed)
Patient hasn't has cholesterol checked since 2014. He already ate today. He has to schedule follow-up for full labs with Dr. Drema Dallas. He would like to just at his cholesterol to that. I've asked him to send [place] a copy of his labs. Continue Lipitor.

## 2014-09-26 DIAGNOSIS — G4733 Obstructive sleep apnea (adult) (pediatric): Secondary | ICD-10-CM | POA: Diagnosis not present

## 2014-10-05 ENCOUNTER — Other Ambulatory Visit: Payer: Self-pay | Admitting: Internal Medicine

## 2014-10-05 MED ORDER — FLUTICASONE-SALMETEROL 115-21 MCG/ACT IN AERO: 2.0000 | INHALATION_SPRAY | Freq: Two times a day (BID) | RESPIRATORY_TRACT | Status: DC

## 2015-09-12 DIAGNOSIS — L0291 Cutaneous abscess, unspecified: Secondary | ICD-10-CM | POA: Diagnosis not present

## 2015-09-12 DIAGNOSIS — L02412 Cutaneous abscess of left axilla: Secondary | ICD-10-CM | POA: Diagnosis not present

## 2015-09-13 ENCOUNTER — Other Ambulatory Visit: Payer: Self-pay | Admitting: Physician Assistant

## 2015-09-13 DIAGNOSIS — C61 Malignant neoplasm of prostate: Secondary | ICD-10-CM | POA: Diagnosis not present

## 2015-09-17 DIAGNOSIS — N401 Enlarged prostate with lower urinary tract symptoms: Secondary | ICD-10-CM | POA: Diagnosis not present

## 2015-09-17 DIAGNOSIS — C61 Malignant neoplasm of prostate: Secondary | ICD-10-CM | POA: Diagnosis not present

## 2015-09-17 DIAGNOSIS — R3912 Poor urinary stream: Secondary | ICD-10-CM | POA: Diagnosis not present

## 2015-09-27 DIAGNOSIS — G4733 Obstructive sleep apnea (adult) (pediatric): Secondary | ICD-10-CM | POA: Diagnosis not present

## 2015-10-29 ENCOUNTER — Encounter: Payer: Self-pay | Admitting: Cardiovascular Disease

## 2015-10-29 DIAGNOSIS — Z131 Encounter for screening for diabetes mellitus: Secondary | ICD-10-CM | POA: Diagnosis not present

## 2015-10-29 DIAGNOSIS — E7800 Pure hypercholesterolemia, unspecified: Secondary | ICD-10-CM | POA: Diagnosis not present

## 2015-10-29 DIAGNOSIS — C61 Malignant neoplasm of prostate: Secondary | ICD-10-CM | POA: Diagnosis not present

## 2015-10-29 DIAGNOSIS — D7589 Other specified diseases of blood and blood-forming organs: Secondary | ICD-10-CM | POA: Diagnosis not present

## 2015-10-29 DIAGNOSIS — Z0000 Encounter for general adult medical examination without abnormal findings: Secondary | ICD-10-CM | POA: Diagnosis not present

## 2015-10-31 DIAGNOSIS — L989 Disorder of the skin and subcutaneous tissue, unspecified: Secondary | ICD-10-CM | POA: Diagnosis not present

## 2015-10-31 DIAGNOSIS — N400 Enlarged prostate without lower urinary tract symptoms: Secondary | ICD-10-CM | POA: Diagnosis not present

## 2015-10-31 DIAGNOSIS — I252 Old myocardial infarction: Secondary | ICD-10-CM | POA: Diagnosis not present

## 2015-10-31 DIAGNOSIS — E7800 Pure hypercholesterolemia, unspecified: Secondary | ICD-10-CM | POA: Diagnosis not present

## 2015-10-31 DIAGNOSIS — G4733 Obstructive sleep apnea (adult) (pediatric): Secondary | ICD-10-CM | POA: Diagnosis not present

## 2015-10-31 DIAGNOSIS — Z0000 Encounter for general adult medical examination without abnormal findings: Secondary | ICD-10-CM | POA: Diagnosis not present

## 2015-10-31 DIAGNOSIS — Z5181 Encounter for therapeutic drug level monitoring: Secondary | ICD-10-CM | POA: Diagnosis not present

## 2015-10-31 DIAGNOSIS — Z23 Encounter for immunization: Secondary | ICD-10-CM | POA: Diagnosis not present

## 2015-10-31 DIAGNOSIS — D7589 Other specified diseases of blood and blood-forming organs: Secondary | ICD-10-CM | POA: Diagnosis not present

## 2015-10-31 DIAGNOSIS — C61 Malignant neoplasm of prostate: Secondary | ICD-10-CM | POA: Diagnosis not present

## 2015-10-31 DIAGNOSIS — R7301 Impaired fasting glucose: Secondary | ICD-10-CM | POA: Diagnosis not present

## 2015-10-31 DIAGNOSIS — J449 Chronic obstructive pulmonary disease, unspecified: Secondary | ICD-10-CM | POA: Diagnosis not present

## 2015-11-13 DIAGNOSIS — L570 Actinic keratosis: Secondary | ICD-10-CM | POA: Diagnosis not present

## 2015-11-13 DIAGNOSIS — Z1283 Encounter for screening for malignant neoplasm of skin: Secondary | ICD-10-CM | POA: Diagnosis not present

## 2015-11-13 DIAGNOSIS — X32XXXA Exposure to sunlight, initial encounter: Secondary | ICD-10-CM | POA: Diagnosis not present

## 2015-11-21 ENCOUNTER — Other Ambulatory Visit: Payer: Medicare Other

## 2015-11-22 NOTE — Progress Notes (Signed)
Chief Complaint  Patient presents with  . Coronary Artery Disease    follow up      History of Present Illness: 70 yo male with history of CAD s/p STEMI 06/25/12 with occluded RCA s/p DES x 1 mid RCA, COPD, tobacco abuse here today for cardiac follow up. He was admitted to [facility] 06/25/12 with an inferior STEMI, found to have occluded mid RCA treated with DES x 1 mid RCA. He has residual 30% proximal circumflex stenosis, mild luminal irregularities elsewhere, LVEF= 55%. 2-D echo showed EF of 55% with basal inferior wall hypokinesis. Patient did well post MI and quit smoking.   He is here today for follow up. He denies any chest pain, palpitations, dizziness, or presyncope. He does have emphysema and has chronic dyspnea on exertion. He is active. He has been smoking occasionally.   Primary Care Physician: Gerrit Heck, MD  Past Medical History:  Diagnosis Date  . BPH (benign prostatic hyperplasia)   . CAD (coronary artery disease) 06/25/2012   a. Inf STEMI s/p cath- DES-mid RCA b. Echo- EF 55%, basal inferior HK  . COPD (chronic obstructive pulmonary disease) ([place])   . OSA (obstructive sleep apnea)   . Tobacco abuse     Past Surgical History:  Procedure Laterality Date  . CATARACT EXTRACTION    . CORONARY ANGIOPLASTY WITH STENT PLACEMENT  06/25/2012   100% mid RCA s/p DES, 30% prox AVG Cx, mild luminal irregularities elsewhere; EF 55%  . HERNIA REPAIR    . LEFT HEART CATHETERIZATION WITH CORONARY ANGIOGRAM N/A 06/25/2012   Procedure: LEFT HEART CATHETERIZATION WITH CORONARY ANGIOGRAM;  Surgeon: Burnell Blanks, MD;  Location: [facility] CATH LAB;  Service: Cardiovascular;  Laterality: N/A;  . PERCUTANEOUS CORONARY STENT INTERVENTION (PCI-S)  06/25/2012   Procedure: PERCUTANEOUS CORONARY STENT INTERVENTION (PCI-S);  Surgeon: Burnell Blanks, MD;  Location: [facility] CATH LAB;  Service: Cardiovascular;;    Current Outpatient Prescriptions  Medication Sig Dispense Refill  .  albuterol (PROVENTIL HFA;VENTOLIN HFA) 108 (90 BASE) MCG/ACT inhaler Inhale 2 puffs into the lungs every 6 (six) hours as needed for wheezing or shortness of breath. 3 Inhaler 0  . aspirin 81 MG chewable tablet Chew 1 tablet (81 mg total) by mouth daily.    . finasteride (PROSCAR) 5 MG tablet Take 5 mg by mouth daily.    . fluticasone-salmeterol (ADVAIR HFA) 115-21 MCG/ACT inhaler Inhale 2 puffs into the lungs 2 (two) times daily. 3 Inhaler 0  . sildenafil (VIAGRA) 50 MG tablet Take 1 tablet (50 mg total) by mouth daily as needed for erectile dysfunction. 10 tablet 3  . tamsulosin (FLOMAX) 0.4 MG CAPS Take 0.4 mg by mouth 2 (two) times daily.    Marland Kitchen atorvastatin (LIPITOR) 80 MG tablet Take 1 tablet (80 mg total) by mouth at bedtime. 90 tablet 3  . clopidogrel (PLAVIX) 75 MG tablet Take 1 tablet (75 mg total) by mouth daily. 90 tablet 3  . metoprolol tartrate (LOPRESSOR) 25 MG tablet Take 1 tablet (25 mg total) by mouth 2 (two) times daily. 180 tablet 3  . nitroGLYCERIN (NITROSTAT) 0.4 MG SL tablet Place 1 tablet (0.4 mg total) under the tongue every 5 (five) minutes as needed for chest pain. 75 tablet 2   No current facility-administered medications for this visit.     No Known Allergies  Social History   Social History  . Marital status: Divorced    Spouse name: N/A  . Number of children: 2  . Years  of education: N/A   Occupational History  . Retired    Social History Main Topics  . Smoking status: Former Smoker    Packs/day: 1.00    Years: 50.00    Types: Cigarettes    Quit date: 06/01/2012  . Smokeless tobacco: Never Used  . Alcohol use Yes     Comment: 3 beers daily  . Drug use: No  . Sexual activity: Not on file   Other Topics Concern  . Not on file   Social History Narrative   Patient lives with his daughter    Family History  Problem Relation Age of Onset  . Heart attack Father   . CAD Father   . Emphysema Maternal Grandfather     smoked heavily   . Pancreatic  cancer Father   . Stomach cancer Paternal Grandmother   . Prostate cancer Paternal Uncle     Review of Systems:  As stated in the HPI and otherwise negative.   BP 108/70   Pulse 73   Ht 5\' 7"  (1.702 m)   Wt 91.2 kg (201 lb)   BMI 31.48 kg/m   Physical Examination: General: Well developed, well nourished, NAD  HEENT: OP clear, mucus membranes moist  SKIN: warm, dry. No rashes. Neuro: No focal deficits  Musculoskeletal: Muscle strength 5/5 all ext  Psychiatric: Mood and affect normal  Neck: No JVD, no carotid bruits, no thyromegaly, no lymphadenopathy.  Lungs:Clear bilaterally, no wheezes, rhonci, crackles Cardiovascular: Regular rate and rhythm. No murmurs, gallops or rubs. Abdomen:Soft. Bowel sounds present. Non-tender.  Extremities: No lower extremity edema. Pulses are 2 + in the bilateral DP/PT.  EKG:  EKG is ordered today. The ekg ordered today demonstrates NSR, rate 73 . PVC  Recent Labs: No results found for requested labs within last 8760 hours.   Lipid Panel    Component Value Date/Time   CHOL 119 08/11/2012 0924   TRIG 82.0 08/11/2012 0924   HDL 51.70 08/11/2012 0924   CHOLHDL 2 08/11/2012 0924   VLDL 16.4 08/11/2012 0924   LDLCALC 51 08/11/2012 0924     Wt Readings from Last 3 Encounters:  11/23/15 91.2 kg (201 lb)  09/25/14 87.1 kg (192 lb)  05/23/14 88.4 kg (194 lb 12.8 oz)     Other studies Reviewed: Additional studies/ records that were reviewed today include: . Review of the above records demonstrates:   Assessment and Plan:   1. CAD: He is doing well with no chest pain worrisome for angina. Will continue ASA and Plavix. Continue statin, beta blocker. He is off of an Ace-inh per recs of Pulmonary office. LV function is normal. BP and lipids are controlled.   2. Tobacco abuse: He has stopped smoking after recent relapse. He only smokes occasionally.   Current medicines are reviewed at length with the patient today.  The patient does not have  concerns regarding medicines.  The following changes have been made:  no change  Labs/ tests ordered today include:   Orders Placed This Encounter  Procedures  . EKG 12-Lead   Disposition:   FU with me in 12 months  Signed, Lauree Chandler, MD 11/23/2015 9:25 AM    [facility] [place], [place], [place]  [address] Phone: [phone]; Fax: [phone]

## 2015-11-23 ENCOUNTER — Encounter (INDEPENDENT_AMBULATORY_CARE_PROVIDER_SITE_OTHER): Payer: Self-pay

## 2015-11-23 ENCOUNTER — Encounter: Payer: Self-pay | Admitting: Cardiovascular Disease

## 2015-11-23 ENCOUNTER — Ambulatory Visit (INDEPENDENT_AMBULATORY_CARE_PROVIDER_SITE_OTHER): Payer: Medicare Other | Admitting: Cardiovascular Disease

## 2015-11-23 VITALS — BP 108/70 | HR 73 | Ht 67.0 in | Wt 201.0 lb

## 2015-11-23 DIAGNOSIS — I2510 Atherosclerotic heart disease of native coronary artery without angina pectoris: Secondary | ICD-10-CM

## 2015-11-23 DIAGNOSIS — Z720 Tobacco use: Secondary | ICD-10-CM | POA: Diagnosis not present

## 2015-11-23 MED ORDER — ATORVASTATIN CALCIUM 80 MG PO TABS: 80.0000 mg | ORAL_TABLET | Freq: Every day | ORAL | 3 refills | Status: DC

## 2015-11-23 MED ORDER — CLOPIDOGREL BISULFATE 75 MG PO TABS: 75.0000 mg | ORAL_TABLET | Freq: Every day | ORAL | 3 refills | Status: DC

## 2015-11-23 MED ORDER — METOPROLOL TARTRATE 25 MG PO TABS: 25.0000 mg | ORAL_TABLET | Freq: Two times a day (BID) | ORAL | 3 refills | Status: DC

## 2015-11-23 MED ORDER — NITROGLYCERIN 0.4 MG SL SUBL
0.4000 mg | SUBLINGUAL_TABLET | SUBLINGUAL | 2 refills | Status: DC | PRN
Start: 2015-11-23 — End: 2016-11-24

## 2015-11-23 NOTE — Patient Instructions (Signed)

## 2016-08-26 ENCOUNTER — Telehealth: Payer: Self-pay | Admitting: Cardiovascular Disease

## 2016-08-26 DIAGNOSIS — I2119 ST elevation (STEMI) myocardial infarction involving other coronary artery of inferior wall: Secondary | ICD-10-CM

## 2016-08-26 DIAGNOSIS — E7849 Other hyperlipidemia: Secondary | ICD-10-CM

## 2016-08-26 DIAGNOSIS — E871 Hypo-osmolality and hyponatremia: Secondary | ICD-10-CM

## 2016-08-26 DIAGNOSIS — I2510 Atherosclerotic heart disease of native coronary artery without angina pectoris: Secondary | ICD-10-CM

## 2016-08-26 NOTE — Telephone Encounter (Signed)
Patient is scheduled for his yearly f/u appt on 11/24/16 and is asking to have routine lab work done prior to this visit at his cardiologist's office.

## 2016-08-26 NOTE — Telephone Encounter (Signed)
Pt would like a call back about labs and doing them 2days before his visit.    Pt needs orders.

## 2016-08-26 NOTE — Telephone Encounter (Signed)
We can arrange a CMET and a lipid panel in a fasting state (unless he has had this done in primary care within last 12 months). Thanks, chris

## 2016-08-26 NOTE — Telephone Encounter (Signed)
Patient informed and verbalized understanding

## 2016-10-06 DIAGNOSIS — G4733 Obstructive sleep apnea (adult) (pediatric): Secondary | ICD-10-CM | POA: Diagnosis not present

## 2016-10-07 DIAGNOSIS — H2512 Age-related nuclear cataract, left eye: Secondary | ICD-10-CM | POA: Diagnosis not present

## 2016-11-10 ENCOUNTER — Other Ambulatory Visit: Payer: Medicare Other | Admitting: *Deleted

## 2016-11-10 DIAGNOSIS — I2510 Atherosclerotic heart disease of native coronary artery without angina pectoris: Secondary | ICD-10-CM

## 2016-11-10 DIAGNOSIS — E784 Other hyperlipidemia: Secondary | ICD-10-CM | POA: Diagnosis not present

## 2016-11-10 DIAGNOSIS — E7849 Other hyperlipidemia: Secondary | ICD-10-CM

## 2016-11-10 DIAGNOSIS — I2119 ST elevation (STEMI) myocardial infarction involving other coronary artery of inferior wall: Secondary | ICD-10-CM

## 2016-11-10 DIAGNOSIS — E871 Hypo-osmolality and hyponatremia: Secondary | ICD-10-CM | POA: Diagnosis not present

## 2016-11-10 LAB — SPECIMEN STATUS

## 2016-11-10 LAB — LIPID PANEL
CHOLESTEROL TOTAL: 121 mg/dL (ref 100–199)
Chol/HDL Ratio: 2.6 ratio (ref 0.0–5.0)
HDL: 47 mg/dL (ref >39)
LDL Calculated: 51 mg/dL (ref 0–99)
Triglycerides: 115 mg/dL (ref 0–149)
VLDL Cholesterol Cal: 23 mg/dL (ref 5–40)

## 2016-11-13 LAB — COMPREHENSIVE METABOLIC PANEL
ALBUMIN: 4.3 g/dL (ref 3.5–4.8)
ALT: 26 IU/L (ref 0–44)
AST: 28 IU/L (ref 0–40)
Albumin/Globulin Ratio: 1.8 (ref 1.2–2.2)
Alkaline Phosphatase: 110 IU/L (ref 39–117)
BUN/Creatinine Ratio: 22 (ref 10–24)
BUN: 24 mg/dL (ref 8–27)
Bilirubin Total: 0.8 mg/dL (ref 0.0–1.2)
CO2: 21 mmol/L (ref 20–29)
CREATININE: 1.08 mg/dL (ref 0.76–1.27)
Calcium: 9.3 mg/dL (ref 8.6–10.2)
Chloride: 100 mmol/L (ref 96–106)
GFR, EST AFRICAN AMERICAN: 80 mL/min/{1.73_m2} (ref >59)
GFR, EST NON AFRICAN AMERICAN: 69 mL/min/{1.73_m2} (ref >59)
GLOBULIN, TOTAL: 2.4 g/dL (ref 1.5–4.5)
GLUCOSE: 96 mg/dL (ref 65–99)
Potassium: 4.2 mmol/L (ref 3.5–5.2)
SODIUM: 136 mmol/L (ref 134–144)
TOTAL PROTEIN: 6.7 g/dL (ref 6.0–8.5)

## 2016-11-19 LAB — CBC WITH DIFFERENTIAL/PLATELET
BASOS: 0 %
Basophils Absolute: 0 10*3/uL (ref 0.0–0.2)
EOS (ABSOLUTE): 0.2 10*3/uL (ref 0.0–0.4)
EOS: 3 %
HEMATOCRIT: 45.5 % (ref 37.5–51.0)
Hemoglobin: 16 g/dL (ref 13.0–17.7)
Immature Grans (Abs): 0 10*3/uL (ref 0.0–0.1)
Immature Granulocytes: 0 %
Lymphocytes Absolute: 1.2 10*3/uL (ref 0.7–3.1)
Lymphs: 19 %
MCH: 34.9 pg — ABNORMAL HIGH (ref 26.6–33.0)
MCHC: 35.2 g/dL (ref 31.5–35.7)
MCV: 99 fL — AB (ref 79–97)
MONOS ABS: 0.5 10*3/uL (ref 0.1–0.9)
Monocytes: 8 %
Neutrophils Absolute: 4.5 10*3/uL (ref 1.4–7.0)
Neutrophils: 70 %
Platelets: 162 10*3/uL (ref 150–379)
RBC: 4.59 x10E6/uL (ref 4.14–5.80)
RDW: 12.7 % (ref 12.3–15.4)
WBC: 6.4 10*3/uL (ref 3.4–10.8)

## 2016-11-19 LAB — SPECIMEN STATUS REPORT

## 2016-11-23 NOTE — Progress Notes (Signed)
Chief Complaint  Patient presents with  . Follow-up    CAD      History of Present Illness: 71 yo male with history of CAD, COPD, sleep apnea, tobacco abuse, prostate cancer here today for cardiac follow up. He was admitted to [facility] April 2014 with an inferior STEMI and was found to have an occluded mid RCA, treated with DES x 1 mid RCA. He has residual 30% proximal circumflex stenosis, mild luminal irregularities elsewhere, Echo 2014 with LVEF of 55% with basal inferior wall hypokinesis. He stopped smoking following his MI.   He is here today for follow up of his CAD. The patient denies any chest pain, dyspnea, palpitations, lower extremity edema, orthopnea, PND, dizziness, near syncope or syncope.   Primary Care Physician: Leighton Ruff, MD  Past Medical History:  Diagnosis Date  . BPH (benign prostatic hyperplasia)   . CAD (coronary artery disease) 06/25/2012   a. Inf STEMI s/p cath- DES-mid RCA b. Echo- EF 55%, basal inferior HK  . COPD (chronic obstructive pulmonary disease) ([place])   . OSA (obstructive sleep apnea)   . Tobacco abuse     Past Surgical History:  Procedure Laterality Date  . CATARACT EXTRACTION    . CORONARY ANGIOPLASTY WITH STENT PLACEMENT  06/25/2012   100% mid RCA s/p DES, 30% prox AVG Cx, mild luminal irregularities elsewhere; EF 55%  . HERNIA REPAIR    . LEFT HEART CATHETERIZATION WITH CORONARY ANGIOGRAM N/A 06/25/2012   Procedure: LEFT HEART CATHETERIZATION WITH CORONARY ANGIOGRAM;  Surgeon: Burnell Blanks, MD;  Location: [facility] CATH LAB;  Service: Cardiovascular;  Laterality: N/A;  . PERCUTANEOUS CORONARY STENT INTERVENTION (PCI-S)  06/25/2012   Procedure: PERCUTANEOUS CORONARY STENT INTERVENTION (PCI-S);  Surgeon: Burnell Blanks, MD;  Location: [facility] CATH LAB;  Service: Cardiovascular;;    Current Outpatient Prescriptions  Medication Sig Dispense Refill  . albuterol (PROVENTIL HFA;VENTOLIN HFA) 108 (90 BASE) MCG/ACT inhaler Inhale 2 puffs  into the lungs every 6 (six) hours as needed for wheezing or shortness of breath. 3 Inhaler 0  . aspirin 81 MG chewable tablet Chew 1 tablet (81 mg total) by mouth daily.    Marland Kitchen atorvastatin (LIPITOR) 80 MG tablet Take 1 tablet (80 mg total) by mouth at bedtime. 90 tablet 3  . clopidogrel (PLAVIX) 75 MG tablet Take 1 tablet (75 mg total) by mouth daily. 90 tablet 3  . finasteride (PROSCAR) 5 MG tablet Take 5 mg by mouth daily.    . fluticasone-salmeterol (ADVAIR HFA) 115-21 MCG/ACT inhaler Inhale 2 puffs into the lungs 2 (two) times daily. 3 Inhaler 0  . metoprolol tartrate (LOPRESSOR) 25 MG tablet Take 1 tablet (25 mg total) by mouth 2 (two) times daily. 180 tablet 3  . nitroGLYCERIN (NITROSTAT) 0.4 MG SL tablet Place 1 tablet (0.4 mg total) under the tongue every 5 (five) minutes as needed for chest pain. 75 tablet 2  . sildenafil (VIAGRA) 50 MG tablet Take 1 tablet (50 mg total) by mouth daily as needed for erectile dysfunction. 10 tablet 3  . tamsulosin (FLOMAX) 0.4 MG CAPS Take 0.4 mg by mouth 2 (two) times daily.     No current facility-administered medications for this visit.     No Known Allergies  Social History   Social History  . Marital status: Divorced    Spouse name: N/A  . Number of children: 2  . Years of education: N/A   Occupational History  . Retired    Social History Main Topics  .  Smoking status: Former Smoker    Packs/day: 1.00    Years: 50.00    Types: Cigarettes    Quit date: 06/01/2012  . Smokeless tobacco: Never Used  . Alcohol use Yes     Comment: 3 beers daily  . Drug use: No  . Sexual activity: Not on file   Other Topics Concern  . Not on file   Social History Narrative   Patient lives with his daughter    Family History  Problem Relation Age of Onset  . Heart attack Father   . CAD Father   . Pancreatic cancer Father   . Emphysema Maternal Grandfather        smoked heavily   . Stomach cancer Paternal Grandmother   . Prostate cancer  Paternal Uncle     Review of Systems:  As stated in the HPI and otherwise negative.   BP 122/70   Pulse 73   Ht 5\' 7"  (1.702 m)   Wt 198 lb 12.8 oz (90.2 kg)   SpO2 93%   BMI 31.14 kg/m   Physical Examination:  General: Well developed, well nourished, NAD  HEENT: OP clear, mucus membranes moist  SKIN: warm, dry. No rashes. Neuro: No focal deficits  Musculoskeletal: Muscle strength 5/5 all ext  Psychiatric: Mood and affect normal  Neck: No JVD, no carotid bruits, no thyromegaly, no lymphadenopathy.  Lungs:Clear bilaterally, no wheezes, rhonci, crackles Cardiovascular: Regular rate and rhythm. No murmurs, gallops or rubs. Abdomen:Soft. Bowel sounds present. Non-tender.  Extremities: No lower extremity edema. Pulses are 2 + in the bilateral DP/PT.  EKG:  EKG is  ordered today. The ekg ordered today demonstrates NSR, rate 60 bpm.  Recent Labs: 11/10/2016: ALT 26; BUN 24; Creatinine, Ser 1.08; Hemoglobin WILL FOLLOW; Hemoglobin 16.0; Platelets WILL FOLLOW; Platelets 162; Potassium 4.2; Sodium 136   Lipid Panel    Component Value Date/Time   CHOL 121 11/10/2016 0000   TRIG 115 11/10/2016 0000   HDL 47 11/10/2016 0000   CHOLHDL 2.6 11/10/2016 0000   CHOLHDL 2 08/11/2012 0924   VLDL 16.4 08/11/2012 0924   LDLCALC 51 11/10/2016 0000     Wt Readings from Last 3 Encounters:  11/24/16 198 lb 12.8 oz (90.2 kg)  11/23/15 201 lb (91.2 kg)  09/25/14 192 lb (87.1 kg)     Other studies Reviewed: Additional studies/ records that were reviewed today include: . Review of the above records demonstrates:   Assessment and Plan:   1. CAD without angina: He is having no chest pain suggestive of angina. EKG is normal today. Will continue ASA, Plavix, statin, beta blocker. LDL at goal. BP well controlled.    2. Tobacco abuse: He is only smoking several cigarettes per week. I have asked him to stop smoking.   3. Claudication: He is having weakness and numbness in his legs with some  pain at rest/cramping. He has a long history of tobacco abuse and is at increased risk for PAD. Will arrange LE arterial dopplers/ABI.   Current medicines are reviewed at length with the patient today.  The patient does not have concerns regarding medicines.  The following changes have been made:  no change  Labs/ tests ordered today include:   Orders Placed This Encounter  Procedures  . EKG 12-Lead   Disposition:   FU with me in 12 months  Signed, Lauree Chandler, MD 11/24/2016 9:20 AM    [facility] [place], [place], [place]  [address] Phone: [phone]; Fax: [phone]

## 2016-11-24 ENCOUNTER — Encounter: Payer: Self-pay | Admitting: Cardiovascular Disease

## 2016-11-24 ENCOUNTER — Ambulatory Visit (INDEPENDENT_AMBULATORY_CARE_PROVIDER_SITE_OTHER): Payer: Medicare Other | Admitting: Cardiovascular Disease

## 2016-11-24 VITALS — BP 122/70 | HR 73 | Ht 67.0 in | Wt 198.8 lb

## 2016-11-24 DIAGNOSIS — I739 Peripheral vascular disease, unspecified: Secondary | ICD-10-CM | POA: Diagnosis not present

## 2016-11-24 DIAGNOSIS — I2510 Atherosclerotic heart disease of native coronary artery without angina pectoris: Secondary | ICD-10-CM | POA: Diagnosis not present

## 2016-11-24 DIAGNOSIS — Z720 Tobacco use: Secondary | ICD-10-CM

## 2016-11-24 DIAGNOSIS — I2119 ST elevation (STEMI) myocardial infarction involving other coronary artery of inferior wall: Secondary | ICD-10-CM

## 2016-11-24 MED ORDER — ATORVASTATIN CALCIUM 80 MG PO TABS: 80.0000 mg | ORAL_TABLET | Freq: Every day | ORAL | 3 refills | Status: DC

## 2016-11-24 MED ORDER — METOPROLOL TARTRATE 25 MG PO TABS: 25.0000 mg | ORAL_TABLET | Freq: Two times a day (BID) | ORAL | 3 refills | Status: DC

## 2016-11-24 MED ORDER — NITROGLYCERIN 0.4 MG SL SUBL: 0.4000 mg | SUBLINGUAL_TABLET | SUBLINGUAL | 2 refills | Status: DC | PRN

## 2016-11-24 MED ORDER — CLOPIDOGREL BISULFATE 75 MG PO TABS: 75.0000 mg | ORAL_TABLET | Freq: Every day | ORAL | 3 refills | Status: DC

## 2016-11-24 NOTE — Patient Instructions (Signed)
Medication Instructions:  Your physician recommends that you continue on your current medications as directed. Please refer to the Current Medication list given to you today.   Labwork: none  Testing/Procedures: Your physician has requested that you have a lower extremity arterial duplex. This test is an ultrasound of the arteries in the legs.. It looks at arterial blood flow in the legs. . Allow one hour for Lower . Arterial scans. There are no restrictions or special instructions   Follow-Up: Your physician recommends that you schedule a follow-up appointment in: 12 months. Please call our office in about 9 months to schedule this appointment.    Any Other Special Instructions Will Be Listed Below (If Applicable).     If you need a refill on your cardiac medications before your next appointment, please call your pharmacy.

## 2016-12-02 DIAGNOSIS — C61 Malignant neoplasm of prostate: Secondary | ICD-10-CM | POA: Diagnosis not present

## 2016-12-02 DIAGNOSIS — N400 Enlarged prostate without lower urinary tract symptoms: Secondary | ICD-10-CM | POA: Diagnosis not present

## 2016-12-02 DIAGNOSIS — J449 Chronic obstructive pulmonary disease, unspecified: Secondary | ICD-10-CM | POA: Diagnosis not present

## 2016-12-02 DIAGNOSIS — D7589 Other specified diseases of blood and blood-forming organs: Secondary | ICD-10-CM | POA: Diagnosis not present

## 2016-12-02 DIAGNOSIS — G4733 Obstructive sleep apnea (adult) (pediatric): Secondary | ICD-10-CM | POA: Diagnosis not present

## 2016-12-02 DIAGNOSIS — E7800 Pure hypercholesterolemia, unspecified: Secondary | ICD-10-CM | POA: Diagnosis not present

## 2016-12-02 DIAGNOSIS — R0989 Other specified symptoms and signs involving the circulatory and respiratory systems: Secondary | ICD-10-CM | POA: Diagnosis not present

## 2016-12-02 DIAGNOSIS — R739 Hyperglycemia, unspecified: Secondary | ICD-10-CM | POA: Diagnosis not present

## 2016-12-02 DIAGNOSIS — H9193 Unspecified hearing loss, bilateral: Secondary | ICD-10-CM | POA: Diagnosis not present

## 2016-12-02 DIAGNOSIS — Z0000 Encounter for general adult medical examination without abnormal findings: Secondary | ICD-10-CM | POA: Diagnosis not present

## 2016-12-02 DIAGNOSIS — L989 Disorder of the skin and subcutaneous tissue, unspecified: Secondary | ICD-10-CM | POA: Diagnosis not present

## 2016-12-02 DIAGNOSIS — I252 Old myocardial infarction: Secondary | ICD-10-CM | POA: Diagnosis not present

## 2016-12-03 ENCOUNTER — Other Ambulatory Visit: Payer: Self-pay | Admitting: Family Medicine

## 2016-12-04 ENCOUNTER — Other Ambulatory Visit: Payer: Self-pay | Admitting: Family Medicine

## 2016-12-04 DIAGNOSIS — R0989 Other specified symptoms and signs involving the circulatory and respiratory systems: Secondary | ICD-10-CM

## 2016-12-08 ENCOUNTER — Ambulatory Visit
Admission: RE | Admit: 2016-12-08 | Discharge: 2016-12-08 | Disposition: A | Discharge status: Home / Self Care | Payer: Medicare Other | Source: Ambulatory Visit | Attending: Family Medicine | Admitting: Family Medicine

## 2016-12-08 DIAGNOSIS — R0989 Other specified symptoms and signs involving the circulatory and respiratory systems: Secondary | ICD-10-CM

## 2016-12-08 DIAGNOSIS — I6523 Occlusion and stenosis of bilateral carotid arteries: Secondary | ICD-10-CM | POA: Diagnosis not present

## 2016-12-09 ENCOUNTER — Ambulatory Visit (HOSPITAL_COMMUNITY)
Admission: RE | Admit: 2016-12-09 | Discharge: 2016-12-09 | Disposition: A | Discharge status: Home / Self Care | Payer: Medicare Other | Source: Ambulatory Visit | Attending: Cardiovascular Disease | Admitting: Cardiovascular Disease

## 2016-12-09 DIAGNOSIS — I739 Peripheral vascular disease, unspecified: Secondary | ICD-10-CM | POA: Diagnosis not present

## 2016-12-09 DIAGNOSIS — I2510 Atherosclerotic heart disease of native coronary artery without angina pectoris: Secondary | ICD-10-CM | POA: Insufficient documentation

## 2016-12-09 DIAGNOSIS — F17200 Nicotine dependence, unspecified, uncomplicated: Secondary | ICD-10-CM | POA: Insufficient documentation

## 2016-12-09 DIAGNOSIS — R202 Paresthesia of skin: Secondary | ICD-10-CM | POA: Insufficient documentation

## 2016-12-24 DIAGNOSIS — N401 Enlarged prostate with lower urinary tract symptoms: Secondary | ICD-10-CM | POA: Diagnosis not present

## 2016-12-24 DIAGNOSIS — R3912 Poor urinary stream: Secondary | ICD-10-CM | POA: Diagnosis not present

## 2016-12-24 DIAGNOSIS — C61 Malignant neoplasm of prostate: Secondary | ICD-10-CM | POA: Diagnosis not present

## 2016-12-26 DIAGNOSIS — L570 Actinic keratosis: Secondary | ICD-10-CM | POA: Diagnosis not present

## 2016-12-26 DIAGNOSIS — X32XXXD Exposure to sunlight, subsequent encounter: Secondary | ICD-10-CM | POA: Diagnosis not present

## 2016-12-26 DIAGNOSIS — Z1283 Encounter for screening for malignant neoplasm of skin: Secondary | ICD-10-CM | POA: Diagnosis not present

## 2016-12-31 DIAGNOSIS — H903 Sensorineural hearing loss, bilateral: Secondary | ICD-10-CM | POA: Diagnosis not present

## 2017-06-15 DIAGNOSIS — C61 Malignant neoplasm of prostate: Secondary | ICD-10-CM | POA: Diagnosis not present

## 2017-06-22 DIAGNOSIS — C61 Malignant neoplasm of prostate: Secondary | ICD-10-CM | POA: Diagnosis not present

## 2017-06-22 DIAGNOSIS — N401 Enlarged prostate with lower urinary tract symptoms: Secondary | ICD-10-CM | POA: Diagnosis not present

## 2017-06-22 DIAGNOSIS — R351 Nocturia: Secondary | ICD-10-CM | POA: Diagnosis not present

## 2017-06-22 DIAGNOSIS — R3912 Poor urinary stream: Secondary | ICD-10-CM | POA: Diagnosis not present

## 2017-06-22 DIAGNOSIS — R350 Frequency of micturition: Secondary | ICD-10-CM | POA: Diagnosis not present

## 2017-07-29 ENCOUNTER — Other Ambulatory Visit: Payer: Self-pay | Admitting: Urology

## 2017-07-29 DIAGNOSIS — C61 Malignant neoplasm of prostate: Secondary | ICD-10-CM

## 2017-08-06 ENCOUNTER — Telehealth: Payer: Self-pay | Admitting: *Deleted

## 2017-08-06 NOTE — Telephone Encounter (Signed)
   [facility] Pre-operative Risk Assessment    Request for surgical clearance:  1. What type of surgery is being performed? COLONOSCOPY    2. When is this surgery scheduled? 09/02/17   3. Are there any medications that need to be held prior to surgery and how long? PLAVIX    4. Practice name and name of physician performing surgery? [facility]; DR. PARAG BRAHMBHATT   5. What is your office phone and fax number? [place] # [phone]; FAX # [phone]   6. Anesthesia type (None, local, MAC, general) ? PROPOFOL    Julaine Hua 08/06/2017, 2:05 PM  _________________________________________________________________   (provider comments below)

## 2017-08-07 NOTE — Telephone Encounter (Signed)
   Primary Cardiologist:Dr McAlhaney  Chart reviewed as part of pre-operative protocol coverage. Because of Juan Lynn's past medical history and time since last visit, he/she will require a follow-up visit in order to better assess preoperative cardiovascular risk.  Pre-op covering staff: - Please schedule appointment with Dr Julianne Handler or an APP at [facility] and call patient to inform them. - Please contact requesting surgeon's office via preferred method (i.e, phone, fax) to inform them of need for appointment prior to surgery.  [name], PA-C  08/07/2017, 11:27 AM

## 2017-08-24 NOTE — Telephone Encounter (Signed)
error 

## 2017-08-25 ENCOUNTER — Encounter: Payer: Self-pay | Admitting: Physician Assistant

## 2017-08-25 ENCOUNTER — Ambulatory Visit (INDEPENDENT_AMBULATORY_CARE_PROVIDER_SITE_OTHER): Payer: Medicare Other | Admitting: Physician Assistant

## 2017-08-25 VITALS — BP 122/80 | HR 65 | Ht 67.0 in | Wt 190.4 lb

## 2017-08-25 DIAGNOSIS — Z720 Tobacco use: Secondary | ICD-10-CM

## 2017-08-25 DIAGNOSIS — I2510 Atherosclerotic heart disease of native coronary artery without angina pectoris: Secondary | ICD-10-CM

## 2017-08-25 DIAGNOSIS — Z01810 Encounter for preprocedural cardiovascular examination: Secondary | ICD-10-CM

## 2017-08-25 DIAGNOSIS — E7849 Other hyperlipidemia: Secondary | ICD-10-CM | POA: Diagnosis not present

## 2017-08-25 NOTE — Patient Instructions (Signed)
Medication Instructions:  No changes.  See medication list.  Labwork: None  Testing/Procedures: None  Follow-Up: Lauree Chandler, MD in 1 year.   Any Other Special Instructions Will Be Listed Below (If Applicable).  If you need a refill on your cardiac medications before your next appointment, please call your pharmacy.

## 2017-08-25 NOTE — Telephone Encounter (Signed)
See office note today. Patient cleared for colonoscopy. Letter sent to GI office. Richardson Dopp, PA-C    08/25/2017 3:19 PM

## 2017-08-25 NOTE — Progress Notes (Signed)
Cardiology Office Note:    Date:  08/25/2017   ID:  Juan Lynn, DOB [birth year], MRN [id number]  PCP:  Leighton Ruff, MD  Cardiologist:  Lauree Chandler, MD   Referring MD: Leighton Ruff, MD   Chief Complaint  Patient presents with  . Surgical Clearance    History of Present Illness:    Juan Lynn is a 72 y.o. male with coronary artery disease status post inferior MI in 2014 treated with a DES to the RCA, COPD, sleep apnea, prostate cancer, tobacco abuse.  He was last seen by Dr. Angelena Form in September 2018.  Juan Lynn returns for surgical clearance.  He needs a colonoscopy on 09/02/2017.  Since last seen, he has been doing well.  He denies chest pain, shortness of breath, syncope, orthopnea, PND or significant pedal edema.  Prior CV studies:   The following studies were reviewed today:  ABIs 10/18 Normal  Carotid US 12/08/2016 Mild bilateral plaque without significant stenosis  Echo 06/26/2012 Inferior HK, EF 55, normal RVSF  Cardiac catheterization 06/25/2012 LM no disease LAD mid irregularities LCx proximal 30 RCA mid 100 EF 55 PCI: 3 x 24 mm Promus Premier DES to mid RCA  Past Medical History:  Diagnosis Date  . BPH (benign prostatic hyperplasia)   . CAD (coronary artery disease) 06/25/2012   a. Inf STEMI s/p cath- DES-mid RCA b. Echo- EF 55%, basal inferior HK  . COPD (chronic obstructive pulmonary disease) ([place])   . OSA (obstructive sleep apnea)   . Tobacco abuse    Surgical Hx: The patient  has a past surgical history that includes Hernia repair; Cataract extraction; Coronary angioplasty with stent (06/25/2012); left heart catheterization with coronary angiogram (N/A, 06/25/2012); and percutaneous coronary stent intervention (pci-s) (06/25/2012).   Current Medications: Current Meds  Medication Sig  . albuterol (PROVENTIL HFA;VENTOLIN HFA) 108 (90 BASE) MCG/ACT inhaler Inhale 2 puffs into the lungs every 6 (six) hours as needed for wheezing  or shortness of breath.  Marland Kitchen aspirin 81 MG chewable tablet Chew 1 tablet (81 mg total) by mouth daily.  Marland Kitchen atorvastatin (LIPITOR) 80 MG tablet Take 1 tablet (80 mg total) by mouth at bedtime.  . clopidogrel (PLAVIX) 75 MG tablet Take 1 tablet (75 mg total) by mouth daily.  . finasteride (PROSCAR) 5 MG tablet Take 5 mg by mouth daily.  . fluticasone-salmeterol (ADVAIR HFA) 115-21 MCG/ACT inhaler Inhale 2 puffs into the lungs 2 (two) times daily.  . metoprolol tartrate (LOPRESSOR) 25 MG tablet Take 1 tablet (25 mg total) by mouth 2 (two) times daily.  . nitroGLYCERIN (NITROSTAT) 0.4 MG SL tablet Place 1 tablet (0.4 mg total) under the tongue every 5 (five) minutes as needed for chest pain.  . polyethylene glycol-electrolytes (NULYTELY/GOLYTELY) 420 g solution Take 420 g by mouth See admin instructions.  . tamsulosin (FLOMAX) 0.4 MG CAPS Take 0.4 mg by mouth 2 (two) times daily.     Allergies:   Patient has no known allergies.   Social History   Tobacco Use  . Smoking status: Former Smoker    Packs/day: 1.00    Years: 50.00    Pack years: 50.00    Types: Cigarettes    Last attempt to quit: 06/01/2012    Years since quitting: 5.2  . Smokeless tobacco: Never Used  Substance Use Topics  . Alcohol use: Yes    Comment: 3 beers daily  . Drug use: No     Family Hx: The patient's family history includes CAD in his  father; Emphysema in his maternal grandfather; Heart attack in his father; Pancreatic cancer in his father; Prostate cancer in his paternal uncle; Stomach cancer in his paternal grandmother.  ROS:   Please see the history of present illness.    ROS All other systems reviewed and are negative.   EKGs/Labs/Other Test Reviewed:    EKG:  EKG is  ordered today.  The ekg ordered today demonstrates normal sinus rhythm, heart rate 65, normal axis, nonspecific ST-T wave changes, QTC 407  Recent Labs: 11/10/2016: ALT 26; BUN 24; Creatinine, Ser 1.08; Hemoglobin WILL FOLLOW; Hemoglobin  16.0; Platelets WILL FOLLOW; Platelets 162; Potassium 4.2; Sodium 136   Recent Lipid Panel Lab Results  Component Value Date/Time   CHOL 121 11/10/2016 12:00 AM   TRIG 115 11/10/2016 12:00 AM   HDL 47 11/10/2016 12:00 AM   CHOLHDL 2.6 11/10/2016 12:00 AM   CHOLHDL 2 08/11/2012 09:24 AM   LDLCALC 51 11/10/2016 12:00 AM    Physical Exam:    VS:  BP 122/80   Pulse 65   Ht 5\' 7"  (1.702 m)   Wt 190 lb 6.4 oz (86.4 kg)   SpO2 94%   BMI 29.82 kg/m     Wt Readings from Last 3 Encounters:  08/25/17 190 lb 6.4 oz (86.4 kg)  11/24/16 198 lb 12.8 oz (90.2 kg)  11/23/15 201 lb (91.2 kg)     Physical Exam  Constitutional: He is oriented to person, place, and time. He appears well-developed and well-nourished. No distress.  HENT:  Head: Normocephalic and atraumatic.  Neck: Neck supple. No JVD present.  Cardiovascular: Normal rate, regular rhythm, S1 normal and S2 normal.  No murmur heard. Pulmonary/Chest: Breath sounds normal. He has no rales.  Abdominal: Soft. There is no hepatomegaly.  Musculoskeletal: He exhibits no edema.  Neurological: He is alert and oriented to person, place, and time.  Skin: Skin is warm and dry.    ASSESSMENT & PLAN:    Preoperative cardiovascular examination His perioperative risk of major cardiac event is low at 0.9% according to the revised cardiac risk index.  Therefore, he may proceed with his procedure at acceptable risk.  He does not require further testing.  Ideally, he should remain on aspirin without interruption.  If the bleeding risk is too great, aspirin may be held for 5 to 7 days and resumed as soon as possible postoperatively.  He may hold his Plavix for 5 to 7 days prior to his procedure and resume it as soon as possible after his procedure.  Coronary artery disease involving native coronary artery of native heart without angina pectoris History of myocardial infarction in 2014 treated with a drug-eluting stent to the RCA.  He is currently  doing well without anginal symptoms.  Continue aspirin, Plavix, statin, beta-blocker.  Other hyperlipidemia LDL optimal on most recent lab work.  Continue current Rx.    Tobacco abuse He has quit smoking.   Dispo:  Return in about 1 year (around 08/26/2018) for Routine Follow Up w/ Dr. Angelena Form.   Medication Adjustments/Labs and Tests Ordered: Current medicines are reviewed at length with the patient today.  Concerns regarding medicines are outlined above.  Tests Ordered: Orders Placed This Encounter  Procedures  . EKG 12-Lead   Medication Changes: No orders of the defined types were placed in this encounter.   Signed, Richardson Dopp, PA-C  08/25/2017 3:18 PM    [facility] [address], [place], [place]  [address] Phone: [phone])  [phone]; Fax: [phone]    

## 2017-09-02 DIAGNOSIS — Z86010 Personal history of colonic polyps: Secondary | ICD-10-CM | POA: Diagnosis not present

## 2017-09-02 DIAGNOSIS — D126 Benign neoplasm of colon, unspecified: Secondary | ICD-10-CM | POA: Diagnosis not present

## 2017-09-02 DIAGNOSIS — K640 First degree hemorrhoids: Secondary | ICD-10-CM | POA: Diagnosis not present

## 2017-09-02 DIAGNOSIS — K635 Polyp of colon: Secondary | ICD-10-CM | POA: Diagnosis not present

## 2017-09-02 DIAGNOSIS — K5730 Diverticulosis of large intestine without perforation or abscess without bleeding: Secondary | ICD-10-CM | POA: Diagnosis not present

## 2017-09-08 DIAGNOSIS — D126 Benign neoplasm of colon, unspecified: Secondary | ICD-10-CM | POA: Diagnosis not present

## 2017-09-08 DIAGNOSIS — K635 Polyp of colon: Secondary | ICD-10-CM | POA: Diagnosis not present

## 2017-09-23 ENCOUNTER — Ambulatory Visit
Admission: RE | Admit: 2017-09-23 | Discharge: 2017-09-23 | Disposition: A | Discharge status: Home / Self Care | Payer: Medicare Other | Source: Ambulatory Visit | Attending: Urology | Admitting: Urology

## 2017-09-23 DIAGNOSIS — C61 Malignant neoplasm of prostate: Secondary | ICD-10-CM | POA: Diagnosis not present

## 2017-09-23 MED ORDER — GADOBENATE DIMEGLUMINE 529 MG/ML IV SOLN
18.0000 mL | Freq: Once | INTRAVENOUS | Status: AC | PRN
  Administered 2017-09-23: 18 mL via INTRAVENOUS

## 2017-10-05 DIAGNOSIS — G4733 Obstructive sleep apnea (adult) (pediatric): Secondary | ICD-10-CM | POA: Diagnosis not present

## 2017-10-27 ENCOUNTER — Other Ambulatory Visit: Payer: Self-pay

## 2017-10-27 ENCOUNTER — Encounter: Payer: Self-pay | Admitting: Vascular Surgery

## 2017-10-27 ENCOUNTER — Ambulatory Visit (INDEPENDENT_AMBULATORY_CARE_PROVIDER_SITE_OTHER): Payer: Medicare Other | Admitting: Vascular Surgery

## 2017-10-27 DIAGNOSIS — I714 Abdominal aortic aneurysm, without rupture, unspecified: Secondary | ICD-10-CM

## 2017-10-27 NOTE — Progress Notes (Signed)
Patient name: Juan Lynn MRN: [id number] DOB: [birth year] Sex: male  REASON FOR CONSULT: 5.4 cm AAA  HPI: Juan Lynn is a 72 y.o. male with history of coronary artery disease status post MI in 2014 currently on Plavix, COPD not on home oxygen that presents for evaluation of an incidentally discovered 5.4 cm aneurysm.  Patient reportedly has a history of prostate cancer initially diagnosed in 2015 and most recently had an MRI of his prostate.  On this MRI was noted to have the aneurysm.  Patient denies any previous knowledge of this aneurysm.  He has never been evaluated by vascular surgeon previously.  He denies any previous abdominal surgery.  On evaluation today he denies any specific midline abdominal pain or back pain.  He states that a little bit of mild tenderness on his right upper extremity around his lower rib margin.  He quit smoking after nearly 1 pack/day for 40 years.  His only other surgery aside from coronary stent is a cataract.  States he can walk up 2 flights of stairs with mild SOB.  He is scheduled to go to [place] in October for his son's wedding and would like to delay surgery until after this if possible.  We have limited information today without more complete imaging.  Past Medical History:  Diagnosis Date  . BPH (benign prostatic hyperplasia)   . CAD (coronary artery disease) 06/25/2012   a. Inf STEMI s/p cath- DES-mid RCA b. Echo- EF 55%, basal inferior HK  . COPD (chronic obstructive pulmonary disease) ([place])   . OSA (obstructive sleep apnea)   . Tobacco abuse     Past Surgical History:  Procedure Laterality Date  . CATARACT EXTRACTION    . CORONARY ANGIOPLASTY WITH STENT PLACEMENT  06/25/2012   100% mid RCA s/p DES, 30% prox AVG Cx, mild luminal irregularities elsewhere; EF 55%  . HERNIA REPAIR    . LEFT HEART CATHETERIZATION WITH CORONARY ANGIOGRAM N/A 06/25/2012   Procedure: LEFT HEART CATHETERIZATION WITH CORONARY ANGIOGRAM;  Surgeon: Burnell Blanks, MD;  [facility]: [facility] C[facility] LAB;  Service: Cardiovascular;  Laterality: N/A;  . PERCUTANEOUS CORONARY STENT INTERVENTION (PCI-S)  06/25/2012   Procedure: PERCUTANEOUS CORONARY STENT INTERVENTION (PCI-S);  Surgeon: Burnell Blanks, MD;  Location: [facility] CATH LAB;  Service: Cardiovascular;;    Family History  Problem Relation Age of Onset  . Heart attack Father   . CAD Father   . Pancreatic cancer Father   . Emphysema Maternal Grandfather        smoked heavily   . Stomach cancer Paternal Grandmother   . Prostate cancer Paternal Uncle     SOCIAL HISTORY: Social History   Socioeconomic History  . Marital status: Divorced    Spouse name: Not on file  . Number of children: 2  . Years of education: Not on file  . Highest education level: Not on file  Occupational History  . Occupation: Retired  Scientific laboratory technician  . Financial resource strain: Not on file  . Food insecurity:    Worry: Not on file    Inability: Not on file  . Transportation needs:    Medical: Not on file    Non-medical: Not on file  Tobacco Use  . Smoking status: Former Smoker    Packs/day: 1.00    Years: 50.00    Pack years: 50.00    Types: Cigarettes    Last attempt to quit: 06/01/2012    Years since quitting: 5.4  . Smokeless  tobacco: Never Used  Substance and Sexual Activity  . Alcohol use: Yes    Comment: 3 beers daily  . Drug use: No  . Sexual activity: Not on file  Lifestyle  . Physical activity:    Days per week: Not on file    Minutes per session: Not on file  . Stress: Not on file  Relationships  . Social connections:    Talks on phone: Not on file    Gets together: Not on file    Attends religious service: Not on file    Active member of club or organization: Not on file    Attends meetings of clubs or organizations: Not on file    Relationship status: Not on file  . Intimate partner violence:    Fear of current or ex partner: Not on file    Emotionally abused: Not on file     Physically abused: Not on file    Forced sexual activity: Not on file  Other Topics Concern  . Not on file  Social History Narrative   Patient lives with his daughter    No Known Allergies  Current Outpatient Medications  Medication Sig Dispense Refill  . albuterol (PROVENTIL HFA;VENTOLIN HFA) 108 (90 BASE) MCG/ACT inhaler Inhale 2 puffs into the lungs every 6 (six) hours as needed for wheezing or shortness of breath. 3 Inhaler 0  . aspirin 81 MG chewable tablet Chew 1 tablet (81 mg total) by mouth daily.    Marland Kitchen atorvastatin (LIPITOR) 80 MG tablet Take 1 tablet (80 mg total) by mouth at bedtime. 90 tablet 3  . clopidogrel (PLAVIX) 75 MG tablet Take 1 tablet (75 mg total) by mouth daily. 90 tablet 3  . finasteride (PROSCAR) 5 MG tablet Take 5 mg by mouth daily.    . fluticasone-salmeterol (ADVAIR HFA) 115-21 MCG/ACT inhaler Inhale 2 puffs into the lungs 2 (two) times daily. 3 Inhaler 0  . metoprolol tartrate (LOPRESSOR) 25 MG tablet Take 1 tablet (25 mg total) by mouth 2 (two) times daily. 180 tablet 3  . nitroGLYCERIN (NITROSTAT) 0.4 MG SL tablet Place 1 tablet (0.4 mg total) under the tongue every 5 (five) minutes as needed for chest pain. 75 tablet 2  . tamsulosin (FLOMAX) 0.4 MG CAPS Take 0.4 mg by mouth 2 (two) times daily.    . polyethylene glycol-electrolytes (NULYTELY/GOLYTELY) 420 g solution Take 420 g by mouth See admin instructions.     No current facility-administered medications for this visit.     REVIEW OF SYSTEMS:  [X]  denotes positive finding, [ ]  denotes negative finding Cardiac  Comments:  Chest pain or chest pressure:    Shortness of breath upon exertion: x   Short of breath when lying flat:    Irregular heart rhythm:        Vascular    Pain in calf, thigh, or hip brought on by ambulation:    Pain in feet at night that wakes you up from your sleep:     Blood clot in your veins:    Leg swelling:         Pulmonary    Oxygen at home:    Productive cough:       Wheezing:         Neurologic    Sudden weakness in arms or legs:     Sudden numbness in arms or legs:     Sudden onset of difficulty speaking or slurred speech:    Temporary loss of vision in  one eye:     Problems with dizziness:         Gastrointestinal    Blood in stool:     Vomited blood:         Genitourinary    Burning when urinating:     Blood in urine:        Psychiatric    Major depression:         Hematologic    Bleeding problems:    Problems with blood clotting too easily:        Skin    Rashes or ulcers:        Constitutional    Fever or chills:      PHYSICAL EXAM: Vitals:   10/27/17 1038  BP: 120/77  Pulse: 66  Resp: 18  Temp: (!) 97.5 F (36.4 C)  TempSrc: Oral  SpO2: 93%  Weight: 84.4 kg  Height: 5\' 7"  (1.702 m)    GENERAL: The patient is a well-nourished male, in no acute distress. The vital signs are documented above. CARDIAC: There is a regular rate and rhythm.  VASCULAR:  2+ palpable radial pulse in the bilateral upper extremities 2+ femoral pulse palpable to bilateral groins 1+ palpable dorsalis pedis palpable in the bilateral lower extremities with no tissue loss or signs of ischemia PULMONARY: There is good air exchange bilaterally without wheezing or rales. ABDOMEN: Soft and non-tender with normal pitched bowel sounds.  No tenderness with deep palpation of his aneurysm in the midline.  He has a small umbilical hernia that is reducible. MUSCULOSKELETAL: There are no major deformities or cyanosis. NEUROLOGIC: No focal weakness or paresthesias are detected. SKIN: There are no ulcers or rashes noted. PSYCHIATRIC: The patient has a normal affect.  DATA:   I have independently reviewed the MRI of his prostate and radiologist measured his aneurysm at 5.4 cm.  Ultimately we need more complete imaging to completely characterize his abdominal aneurysm and options for repair.  Assessment/Plan:  72 year old male with incidentally discovered  5.4 cm abdominal aortic aneurysm on an MRI of the prostate.  Unfortunately we need more complete imaging at this time to completely evaluate the dimensions of the aneurysm and options for repair.  I have recommended a CTA abdomen pelvis.  I discussed with Mr. Brunty typical recommendations for repair in abdominal aneurysms are greater than 5.5 cm in diameter or rapid growth in the aneurysm.  Options include open and endovascular repair.  I will need to review his CTA once it is complete and I will contact the patient.  I will arrange a follow-up appointment with me after CTA to further discuss, but again I reinforced that I will contact him once I review the CTA.   Marty Heck, MD [facility] Office: [phone] Pager: [place]

## 2017-10-30 ENCOUNTER — Ambulatory Visit
Admission: RE | Admit: 2017-10-30 | Discharge: 2017-10-30 | Disposition: A | Discharge status: Home / Self Care | Payer: Medicare Other | Source: Ambulatory Visit | Attending: Vascular Surgery | Admitting: Vascular Surgery

## 2017-10-30 DIAGNOSIS — K429 Umbilical hernia without obstruction or gangrene: Secondary | ICD-10-CM | POA: Diagnosis not present

## 2017-10-30 DIAGNOSIS — K5730 Diverticulosis of large intestine without perforation or abscess without bleeding: Secondary | ICD-10-CM | POA: Diagnosis not present

## 2017-10-30 DIAGNOSIS — I714 Abdominal aortic aneurysm, without rupture, unspecified: Secondary | ICD-10-CM

## 2017-10-30 MED ORDER — IOPAMIDOL (ISOVUE-370) INJECTION 76%
75.0000 mL | Freq: Once | INTRAVENOUS | Status: AC | PRN
  Administered 2017-10-30: 75 mL via INTRAVENOUS

## 2017-11-03 ENCOUNTER — Encounter: Payer: Self-pay | Admitting: *Deleted

## 2017-11-03 ENCOUNTER — Encounter: Payer: Self-pay | Admitting: Vascular Surgery

## 2017-11-03 ENCOUNTER — Ambulatory Visit (INDEPENDENT_AMBULATORY_CARE_PROVIDER_SITE_OTHER): Payer: Medicare Other | Admitting: Vascular Surgery

## 2017-11-03 ENCOUNTER — Other Ambulatory Visit: Payer: Self-pay

## 2017-11-03 VITALS — BP 121/83 | HR 76 | Temp 97.6°F | Resp 16 | Ht 67.0 in | Wt 189.0 lb

## 2017-11-03 DIAGNOSIS — I714 Abdominal aortic aneurysm, without rupture, unspecified: Secondary | ICD-10-CM

## 2017-11-03 NOTE — Progress Notes (Signed)
Patient name: Juan Lynn MRN: [id number] DOB: [birth year] Sex: male  REASON FOR CONSULT: 5.4 cm AAA  HPI: Juan Lynn is a 72 y.o. male with history of coronary artery disease status post MI in 2014 currently on Plavix, COPD not on home oxygen that presents for follow-up after CT for AAA.  Patient denies any new symptoms over the weekend including no back pain or abdominal pain.  He got his CTA done on Friday.  Past Medical History:  Diagnosis Date  . BPH (benign prostatic hyperplasia)   . CAD (coronary artery disease) 06/25/2012   a. Inf STEMI s/p cath- DES-mid RCA b. Echo- EF 55%, basal inferior HK  . COPD (chronic obstructive pulmonary disease) ([place])   . OSA (obstructive sleep apnea)   . Tobacco abuse     Past Surgical History:  Procedure Laterality Date  . CATARACT EXTRACTION    . CORONARY ANGIOPLASTY WITH STENT PLACEMENT  06/25/2012   100% mid RCA s/p DES, 30% prox AVG Cx, mild luminal irregularities elsewhere; EF 55%  . HERNIA REPAIR    . LEFT HEART CATHETERIZATION WITH CORONARY ANGIOGRAM N/A 06/25/2012   Procedure: LEFT HEART CATHETERIZATION WITH CORONARY ANGIOGRAM;  Surgeon: Burnell Blanks, MD;  Location: [facility] CATH LAB;  Service: Cardiovascular;  Laterality: N/A;  . PERCUTANEOUS CORONARY STENT INTERVENTION (PCI-S)  06/25/2012   Procedure: PERCUTANEOUS CORONARY STENT INTERVENTION (PCI-S);  Surgeon: Burnell Blanks, MD;  Location: [facility] CATH LAB;  Service: Cardiovascular;;    Family History  Problem Relation Age of Onset  . Heart attack Father   . CAD Father   . Pancreatic cancer Father   . Emphysema Maternal Grandfather        smoked heavily   . Stomach cancer Paternal Grandmother   . Prostate cancer Paternal Uncle     SOCIAL HISTORY: Social History   Socioeconomic History  . Marital status: Divorced    Spouse name: Not on file  . Number of children: 2  . Years of education: Not on file  . Highest education level: Not on file  Occupational  History  . Occupation: Retired  Scientific laboratory technician  . Financial resource strain: Not on file  . Food insecurity:    Worry: Not on file    Inability: Not on file  . Transportation needs:    Medical: Not on file    Non-medical: Not on file  Tobacco Use  . Smoking status: Former Smoker    Packs/day: 1.00    Years: 50.00    Pack years: 50.00    Types: Cigarettes    Last attempt to quit: 06/01/2012    Years since quitting: 5.4  . Smokeless tobacco: Never Used  Substance and Sexual Activity  . Alcohol use: Yes    Comment: 3 beers daily  . Drug use: No  . Sexual activity: Not on file  Lifestyle  . Physical activity:    Days per week: Not on file    Minutes per session: Not on file  . Stress: Not on file  Relationships  . Social connections:    Talks on phone: Not on file    Gets together: Not on file    Attends religious service: Not on file    Active member of club or organization: Not on file    Attends meetings of clubs or organizations: Not on file    Relationship status: Not on file  . Intimate partner violence:    Fear of current or ex partner: Not on  file    Emotionally abused: Not on file    Physically abused: Not on file    Forced sexual activity: Not on file  Other Topics Concern  . Not on file  Social History Narrative   Patient lives with his daughter    No Known Allergies  Current Outpatient Medications  Medication Sig Dispense Refill  . albuterol (PROVENTIL HFA;VENTOLIN HFA) 108 (90 BASE) MCG/ACT inhaler Inhale 2 puffs into the lungs every 6 (six) hours as needed for wheezing or shortness of breath. 3 Inhaler 0  . aspirin 81 MG chewable tablet Chew 1 tablet (81 mg total) by mouth daily.    Marland Kitchen atorvastatin (LIPITOR) 80 MG tablet Take 1 tablet (80 mg total) by mouth at bedtime. 90 tablet 3  . clopidogrel (PLAVIX) 75 MG tablet Take 1 tablet (75 mg total) by mouth daily. 90 tablet 3  . finasteride (PROSCAR) 5 MG tablet Take 5 mg by mouth daily.    .  fluticasone-salmeterol (ADVAIR HFA) 115-21 MCG/ACT inhaler Inhale 2 puffs into the lungs 2 (two) times daily. 3 Inhaler 0  . metoprolol tartrate (LOPRESSOR) 25 MG tablet Take 1 tablet (25 mg total) by mouth 2 (two) times daily. 180 tablet 3  . nitroGLYCERIN (NITROSTAT) 0.4 MG SL tablet Place 1 tablet (0.4 mg total) under the tongue every 5 (five) minutes as needed for chest pain. 75 tablet 2  . polyethylene glycol-electrolytes (NULYTELY/GOLYTELY) 420 g solution Take 420 g by mouth See admin instructions.    . tamsulosin (FLOMAX) 0.4 MG CAPS Take 0.4 mg by mouth 2 (two) times daily.     No current facility-administered medications for this visit.     REVIEW OF SYSTEMS:  Denies abdominal pain or back pain. PHYSICAL EXAM: Vitals:   11/03/17 1608  BP: 121/83  Pulse: 76  Resp: 16  Temp: 97.6 F (36.4 C)  TempSrc: Oral  SpO2: 91%  Weight: 85.7 kg  Height: 5\' 7"  (1.702 m)    GENERAL: The patient is a well-nourished male, in no acute distress. The vital signs are documented above. CARDIAC: There is a regular rate and rhythm.  VASCULAR:  2+ palpable radial pulse in the bilateral upper extremities 2+ femoral pulse palpable to bilateral groins 1+ palpable dorsalis pedis palpable in the bilateral lower extremities with no tissue loss or signs of ischemia PULMONARY: There is good air exchange bilaterally without wheezing or rales. ABDOMEN: Significant abdominal pain with palpation. PSYCHIATRIC: The patient has a normal affect.  DATA:   Infinitely reviewed his CTA.  This shows an approximate 5.2 cm infrarenal aneurysm that appears saccular with its morphology.  There is no surrounding stranding.  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  Access looks adequate. Assessment/Plan:  72 year old male with incidentally discovered abdominal aortic aneurysm on MRI of his prostate.  After review of CTA this appears to be 5.2 cm.  This is asymptomatic at  this time.  After review I am concerned it has a slightly saccular morphology and we should be a little more aggressive in repairing it.  I think he would be a reasonable endovascular candidate given that the aneurysm has a long neck.  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  Discussed with him that I would potentially have to cover at least one if not two of these in order to safely deploy endograft.  Renal function has been stable.  Patient would like to delay  surgery until after a vacation later this month and his son's wedding the beginning of October.  We are looking in the dates at the end of October for endovascular repair.  If he develops any abdominal or back pain symptoms that are out of character he needs to be immediately evaluated.   Marty Heck, MD [facility] [place] Office: [phone] Pager: [place]

## 2017-11-09 ENCOUNTER — Encounter (HOSPITAL_COMMUNITY): Payer: Self-pay | Admitting: Emergency Medicine

## 2017-11-09 ENCOUNTER — Other Ambulatory Visit: Payer: Self-pay

## 2017-11-09 ENCOUNTER — Inpatient Hospital Stay (HOSPITAL_COMMUNITY)
Admission: EM | Admit: 2017-11-09 | Discharge: 2017-11-12 | DRG: 269 | Disposition: A | Discharge status: Home / Self Care | Payer: Medicare Other | Attending: Vascular Surgery | Admitting: Vascular Surgery

## 2017-11-09 ENCOUNTER — Emergency Department (HOSPITAL_COMMUNITY): Payer: Medicare Other

## 2017-11-09 DIAGNOSIS — I2510 Atherosclerotic heart disease of native coronary artery without angina pectoris: Secondary | ICD-10-CM | POA: Diagnosis not present

## 2017-11-09 DIAGNOSIS — Z955 Presence of coronary angioplasty implant and graft: Secondary | ICD-10-CM

## 2017-11-09 DIAGNOSIS — G8929 Other chronic pain: Secondary | ICD-10-CM | POA: Diagnosis present

## 2017-11-09 DIAGNOSIS — I714 Abdominal aortic aneurysm, without rupture, unspecified: Secondary | ICD-10-CM | POA: Diagnosis present

## 2017-11-09 DIAGNOSIS — R402364 Coma scale, best motor response, obeys commands, 24 hours or more after hospital admission: Secondary | ICD-10-CM | POA: Diagnosis not present

## 2017-11-09 DIAGNOSIS — G4733 Obstructive sleep apnea (adult) (pediatric): Secondary | ICD-10-CM | POA: Diagnosis present

## 2017-11-09 DIAGNOSIS — Z9889 Other specified postprocedural states: Secondary | ICD-10-CM

## 2017-11-09 DIAGNOSIS — M546 Pain in thoracic spine: Secondary | ICD-10-CM | POA: Diagnosis not present

## 2017-11-09 DIAGNOSIS — M545 Low back pain: Secondary | ICD-10-CM | POA: Diagnosis present

## 2017-11-09 DIAGNOSIS — Z79899 Other long term (current) drug therapy: Secondary | ICD-10-CM

## 2017-11-09 DIAGNOSIS — Z7902 Long term (current) use of antithrombotics/antiplatelets: Secondary | ICD-10-CM

## 2017-11-09 DIAGNOSIS — R030 Elevated blood-pressure reading, without diagnosis of hypertension: Secondary | ICD-10-CM | POA: Diagnosis present

## 2017-11-09 DIAGNOSIS — R402144 Coma scale, eyes open, spontaneous, 24 hours or more after hospital admission: Secondary | ICD-10-CM | POA: Diagnosis not present

## 2017-11-09 DIAGNOSIS — Z9989 Dependence on other enabling machines and devices: Secondary | ICD-10-CM

## 2017-11-09 DIAGNOSIS — Z87891 Personal history of nicotine dependence: Secondary | ICD-10-CM

## 2017-11-09 DIAGNOSIS — Z7951 Long term (current) use of inhaled steroids: Secondary | ICD-10-CM

## 2017-11-09 DIAGNOSIS — I252 Old myocardial infarction: Secondary | ICD-10-CM

## 2017-11-09 DIAGNOSIS — N400 Enlarged prostate without lower urinary tract symptoms: Secondary | ICD-10-CM | POA: Diagnosis not present

## 2017-11-09 DIAGNOSIS — J439 Emphysema, unspecified: Secondary | ICD-10-CM | POA: Diagnosis not present

## 2017-11-09 DIAGNOSIS — Z7982 Long term (current) use of aspirin: Secondary | ICD-10-CM

## 2017-11-09 DIAGNOSIS — R402254 Coma scale, best verbal response, oriented, 24 hours or more after hospital admission: Secondary | ICD-10-CM | POA: Diagnosis not present

## 2017-11-09 LAB — CBC WITH DIFFERENTIAL/PLATELET
Abs Immature Granulocytes: 0 10*3/uL (ref 0.0–0.1)
BASOS PCT: 0 %
Basophils Absolute: 0 10*3/uL (ref 0.0–0.1)
EOS ABS: 0.1 10*3/uL (ref 0.0–0.7)
Eosinophils Relative: 1 %
HCT: 49.6 % (ref 39.0–52.0)
Hemoglobin: 16.7 g/dL (ref 13.0–17.0)
IMMATURE GRANULOCYTES: 0 %
Lymphocytes Relative: 13 %
Lymphs Abs: 1 10*3/uL (ref 0.7–4.0)
MCH: 33.5 pg (ref 26.0–34.0)
MCHC: 33.7 g/dL (ref 30.0–36.0)
MCV: 99.4 fL (ref 78.0–100.0)
MONOS PCT: 6 %
Monocytes Absolute: 0.5 10*3/uL (ref 0.1–1.0)
NEUTROS PCT: 80 %
Neutro Abs: 6 10*3/uL (ref 1.7–7.7)
PLATELETS: 147 10*3/uL — AB (ref 150–400)
RBC: 4.99 MIL/uL (ref 4.22–5.81)
RDW: 12.4 % (ref 11.5–15.5)
WBC: 7.6 10*3/uL (ref 4.0–10.5)

## 2017-11-09 LAB — COMPREHENSIVE METABOLIC PANEL
ALBUMIN: 4 g/dL (ref 3.5–5.0)
ALK PHOS: 96 U/L (ref 38–126)
ALT: 25 U/L (ref 0–44)
AST: 29 U/L (ref 15–41)
Anion gap: 9 (ref 5–15)
BUN: 13 mg/dL (ref 8–23)
CALCIUM: 9.5 mg/dL (ref 8.9–10.3)
CO2: 24 mmol/L (ref 22–32)
Chloride: 107 mmol/L (ref 98–111)
Creatinine, Ser: 1.02 mg/dL (ref 0.61–1.24)
GFR calc non Af Amer: >60 mL/min (ref >60)
GLUCOSE: 101 mg/dL — AB (ref 70–99)
Potassium: 3.8 mmol/L (ref 3.5–5.1)
SODIUM: 140 mmol/L (ref 135–145)
Total Bilirubin: 1.2 mg/dL (ref 0.3–1.2)
Total Protein: 7.4 g/dL (ref 6.5–8.1)

## 2017-11-09 LAB — I-STAT CHEM 8, ED
BUN: 15 mg/dL (ref 8–23)
CALCIUM ION: 1.1 mmol/L — AB (ref 1.15–1.40)
Chloride: 105 mmol/L (ref 98–111)
Creatinine, Ser: 1 mg/dL (ref 0.61–1.24)
GLUCOSE: 97 mg/dL (ref 70–99)
HCT: 48 % (ref 39.0–52.0)
HEMOGLOBIN: 16.3 g/dL (ref 13.0–17.0)
POTASSIUM: 3.7 mmol/L (ref 3.5–5.1)
Sodium: 140 mmol/L (ref 135–145)
TCO2: 21 mmol/L — ABNORMAL LOW (ref 22–32)

## 2017-11-09 MED ORDER — TAMSULOSIN HCL 0.4 MG PO CAPS
0.8000 mg | ORAL_CAPSULE | Freq: Every day | ORAL | Status: DC
  Administered 2017-11-09 – 2017-11-11 (×3): 0.8 mg via ORAL
  Filled 2017-11-09 (×3): qty 2

## 2017-11-09 MED ORDER — IOPAMIDOL (ISOVUE-370) INJECTION 76%
INTRAVENOUS | Status: AC
  Filled 2017-11-09: qty 100

## 2017-11-09 MED ORDER — ENOXAPARIN SODIUM 40 MG/0.4ML ~~LOC~~ SOLN
40.0000 mg | SUBCUTANEOUS | Status: DC
  Administered 2017-11-09 – 2017-11-11 (×3): 40 mg via SUBCUTANEOUS
  Filled 2017-11-09 (×3): qty 0.4

## 2017-11-09 MED ORDER — IOPAMIDOL (ISOVUE-370) INJECTION 76%
100.0000 mL | Freq: Once | INTRAVENOUS | Status: AC | PRN
  Administered 2017-11-09: 100 mL via INTRAVENOUS

## 2017-11-09 MED ORDER — HYDRALAZINE HCL 20 MG/ML IJ SOLN: 5.0000 mg | INTRAMUSCULAR | Status: DC | PRN

## 2017-11-09 MED ORDER — ALUM & MAG HYDROXIDE-SIMETH 200-200-20 MG/5ML PO SUSP: 15.0000 mL | ORAL | Status: DC | PRN

## 2017-11-09 MED ORDER — PANTOPRAZOLE SODIUM 40 MG PO TBEC
40.0000 mg | DELAYED_RELEASE_TABLET | Freq: Every day | ORAL | Status: DC
  Administered 2017-11-09 – 2017-11-12 (×4): 40 mg via ORAL
  Filled 2017-11-09 (×4): qty 1

## 2017-11-09 MED ORDER — PHENOL 1.4 % MT LIQD: 1.0000 | OROMUCOSAL | Status: DC | PRN

## 2017-11-09 MED ORDER — FINASTERIDE 5 MG PO TABS
5.0000 mg | ORAL_TABLET | Freq: Every day | ORAL | Status: DC
  Administered 2017-11-09 – 2017-11-11 (×3): 5 mg via ORAL
  Filled 2017-11-09 (×3): qty 1

## 2017-11-09 MED ORDER — IOPAMIDOL (ISOVUE-370) INJECTION 76%
INTRAVENOUS | Status: AC
  Filled 2017-11-09: qty 50

## 2017-11-09 MED ORDER — GUAIFENESIN-DM 100-10 MG/5ML PO SYRP: 15.0000 mL | ORAL_SOLUTION | ORAL | Status: DC | PRN

## 2017-11-09 MED ORDER — METOPROLOL TARTRATE 5 MG/5ML IV SOLN: 2.0000 mg | INTRAVENOUS | Status: DC | PRN

## 2017-11-09 MED ORDER — ONDANSETRON HCL 4 MG/2ML IJ SOLN: 4.0000 mg | Freq: Four times a day (QID) | INTRAMUSCULAR | Status: DC | PRN

## 2017-11-09 MED ORDER — LABETALOL HCL 5 MG/ML IV SOLN: 10.0000 mg | INTRAVENOUS | Status: DC | PRN

## 2017-11-09 NOTE — ED Notes (Signed)
Patient ambulatory to bathroom with steady gait at this time 

## 2017-11-09 NOTE — ED Notes (Addendum)
Discussed patient with PA Kenton Kingfisher who will place orders as appropriate, PA in to assess patient at this time

## 2017-11-09 NOTE — ED Provider Notes (Signed)
MSE was initiated and I personally evaluated the patient and placed orders (if any) at  1:35 PM on November 09, 2017.  The patient appears stable so that the remainder of the MSE may be completed by another provider.   Patient placed in Quick Look pathway, seen and evaluated   Chief Complaint: Here for BP recently d/x withy AAA  HPI:   5 days LBP  Worse with standing, Sent by DR. Clark of Vascular for R/o    ROS: (one)  Physical Exam:   Gen: No distress  Neuro: Awake and Alert  Skin: Warm    Focused Exam: No midline TTP   Initiation of care has begun. The patient has been counseled on the process, plan, and necessity for staying for the completion/evaluation, and the remainder of the medical screening examination    Margarita Mail, PA-C 11/09/17 1344    Long, Wonda Olds, MD 11/09/17 [phone]

## 2017-11-09 NOTE — Progress Notes (Signed)
Patient to 4E room 04 at this time. V/s and assessment complete. Telemetry applied and CCMD notified. CHG bath done. Patient oriented to room and how to call nurse with any needs.   Emelda Fear, RN

## 2017-11-09 NOTE — ED Notes (Signed)
Patient transported to CT 

## 2017-11-09 NOTE — ED Triage Notes (Signed)
Pt reports recent diagnosis of aortic aneurism, set for surgery with dr Carlis Abbott at the end of October, c/o lower back pain x5 days, spoke with dr Carlis Abbott who is in the hospital today and advised patient to come to the ED for evaluation. Pt denies any other symptoms, bp elevated to 145/106 in triage.

## 2017-11-09 NOTE — Consult Note (Signed)
Hospital Consult    Reason for Consult: Back pain in setting of known abdominal aortic aneurysm Referring Physician: ED MRN #:  [id number]  History of Present Illness: This is a 72 y.o. 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  He was recently seen in clinic several weeks ago and at the time was having no abdominal pain or back pain.  He states since last Wednesday he has had some dull lower back pain that he initially rated as 6 out of 10.  He decided to come in for evaluation given the length that the pain has persisted through the weekend.  He has had back pain in the past that is of the same character.  On my evaluation in the ED he says his pain is improved now 3 out of 10 with no pain medicine.  No abdominal pain.  Pain improves with walking. He denies any pain with palpation of his aneurysm.  Hypertensive in the ED, but otherwise hemodynamically stable.  Past Medical History:  Diagnosis Date  . BPH (benign prostatic hyperplasia)   . CAD (coronary artery disease) 06/25/2012   a. Inf STEMI s/p cath- DES-mid RCA b. Echo- EF 55%, basal inferior HK  . COPD (chronic obstructive pulmonary disease) ([place])   . OSA (obstructive sleep apnea)   . Tobacco abuse     Past Surgical History:  Procedure Laterality Date  . CATARACT EXTRACTION    . CORONARY ANGIOPLASTY WITH STENT PLACEMENT  06/25/2012   100% mid RCA s/p DES, 30% prox AVG Cx, mild luminal irregularities elsewhere; EF 55%  . HERNIA REPAIR    . LEFT HEART CATHETERIZATION WITH CORONARY ANGIOGRAM N/A 06/25/2012   Procedure: LEFT HEART CATHETERIZATION WITH CORONARY ANGIOGRAM;  Surgeon: Burnell Blanks, MD;  Location: [facility] CATH LAB;  Service: Cardiovascular;  Laterality: N/A;  . PERCUTANEOUS CORONARY STENT INTERVENTION (PCI-S)  06/25/2012   Procedure: PERCUTANEOUS CORONARY STENT INTERVENTION  (PCI-S);  Surgeon: Burnell Blanks, MD;  Location: [facility] CATH LAB;  Service: Cardiovascular;;    No Known Allergies  Prior to Admission medications   Medication Sig Start Date End Date Taking? Authorizing Provider  albuterol (PROVENTIL HFA;VENTOLIN HFA) 108 (90 BASE) MCG/ACT inhaler Inhale 2 puffs into the lungs every 6 (six) hours as needed for wheezing or shortness of breath. 05/17/14   Tanda Rockers, MD  aspirin 81 MG chewable tablet Chew 1 tablet (81 mg total) by mouth daily. 06/27/12   Arguello, Roger A, PA-C  atorvastatin (LIPITOR) 80 MG tablet Take 1 tablet (80 mg total) by mouth at bedtime. 11/24/16   Burnell Blanks, MD  clopidogrel (PLAVIX) 75 MG tablet Take 1 tablet (75 mg total) by mouth daily. 11/24/16   Burnell Blanks, MD  finasteride (PROSCAR) 5 MG tablet Take 5 mg by mouth daily.    [provider]  fluticasone-salmeterol (ADVAIR HFA) 115-21 MCG/ACT inhaler Inhale 2 puffs into the lungs 2 (two) times daily. 10/05/14   Tanda Rockers, MD  metoprolol tartrate (LOPRESSOR) 25 MG tablet Take 1 tablet (25 mg total) by mouth 2 (two) times daily. 11/24/16   Burnell Blanks, MD  nitroGLYCERIN (NITROSTAT) 0.4 MG SL tablet Place 1 tablet (0.4 mg total) under the tongue every 5 (five) minutes as needed for chest pain. 11/24/16   Burnell Blanks, MD  polyethylene glycol-electrolytes (NULYTELY/GOLYTELY) 420 g solution  Take 420 g by mouth See admin instructions. 08/24/17   [provider]  tamsulosin (FLOMAX) 0.4 MG CAPS Take 0.4 mg by mouth 2 (two) times daily.    [provider]    Social History   Socioeconomic History  . Marital status: Divorced    Spouse name: Not on file  . Number of children: 2  . Years of education: Not on file  . Highest education level: Not on file  Occupational History  . Occupation: Retired  Scientific laboratory technician  . Financial resource strain: Not on file  . Food insecurity:    Worry: Not on file     Inability: Not on file  . Transportation needs:    Medical: Not on file    Non-medical: Not on file  Tobacco Use  . Smoking status: Former Smoker    Packs/day: 1.00    Years: 50.00    Pack years: 50.00    Types: Cigarettes    Last attempt to quit: 06/01/2012    Years since quitting: 5.4  . Smokeless tobacco: Never Used  Substance and Sexual Activity  . Alcohol use: Yes    Comment: 3 beers daily  . Drug use: No  . Sexual activity: Not on file  Lifestyle  . Physical activity:    Days per week: Not on file    Minutes per session: Not on file  . Stress: Not on file  Relationships  . Social connections:    Talks on phone: Not on file    Gets together: Not on file    Attends religious service: Not on file    Active member of club or organization: Not on file    Attends meetings of clubs or organizations: Not on file    Relationship status: Not on file  . Intimate partner violence:    Fear of current or ex partner: Not on file    Emotionally abused: Not on file    Physically abused: Not on file    Forced sexual activity: Not on file  Other Topics Concern  . Not on file  Social History Narrative   Patient lives with his daughter     Family History  Problem Relation Age of Onset  . Heart attack Father   . CAD Father   . Pancreatic cancer Father   . Emphysema Maternal Grandfather        smoked heavily   . Stomach cancer Paternal Grandmother   . Prostate cancer Paternal Uncle     ROS: [x]  Positive   [ ]  Negative   [ ]  All sytems reviewed and are negative  Cardiovascular: []  chest pain/pressure []  palpitations []  SOB lying flat []  DOE []  pain in legs while walking []  pain in legs at rest []  pain in legs at night []  non-healing ulcers []  hx of DVT []  swelling in legs  Pulmonary: []  productive cough []  asthma/wheezing []  home O2  Neurologic: []  weakness in []  arms []  legs []  numbness in []  arms []  legs []  hx of CVA []  mini stroke [] difficulty speaking or  slurred speech []  temporary loss of vision in one eye []  dizziness  Hematologic: []  hx of cancer []  bleeding problems []  problems with blood clotting easily  Endocrine:   []  diabetes []  thyroid disease  GI []  vomiting blood []  blood in stool  GU: []  CKD/renal failure []  HD--[]  M/W/F or []  T/T/S []  burning with urination []  blood in urine  Psychiatric: []  anxiety []  depression  Musculoskeletal: []   arthritis [x]  joint pain (back pain)  Integumentary: []  rashes []  ulcers  Constitutional: []  fever []  chills   Physical Examination  Vitals:   11/09/17 1330 11/09/17 1603  BP: (!) 145/106 (!) 157/72  Pulse: 76 65  Resp:  15  SpO2: 95% 100%   There is no height or weight on file to calculate BMI.  General:  NAD, resting in ED Gait: Not observed HENT: WNL, normocephalic Pulmonary: normal non-labored breathing Cardiac: regular, without  Murmurs, rubs or gallops Abdomen: soft, NT/ND, no pain with palpation of his aneurysm Skin: without rashes Vascular Exam/Pulses:  Right Left  Radial 2+ (normal) 2+ (normal)  Ulnar    Femoral 2+ (normal) 2+ (normal)  Popliteal    DP 2+ (normal) 2+ (normal)  PT 2+ (normal) 2+ (normal)   Extremities: without ischemic changes, without Gangrene , without cellulitis; without open wounds;  Musculoskeletal: no muscle wasting or atrophy  Neurologic: A&O X 3; Appropriate Affect ; SENSATION: normal; MOTOR FUNCTION:  moving all extremities equally. Speech is fluent/normal Psychiatric:  Pleasant  CBC    Component Value Date/Time   WBC 7.6 11/09/2017 1349   RBC 4.99 11/09/2017 1349   HGB 16.3 11/09/2017 1358   HGB WILL FOLLOW 11/10/2016 0910   HGB 16.0 11/10/2016 0910   HCT 48.0 11/09/2017 1358   HCT WILL FOLLOW 11/10/2016 0910   HCT 45.5 11/10/2016 0910   PLT 147 (L) 11/09/2017 1349   PLT WILL FOLLOW 11/10/2016 0910   PLT 162 11/10/2016 0910   MCV 99.4 11/09/2017 1349   MCV WILL FOLLOW 11/10/2016 0910   MCV 99 (H) 11/10/2016  0910   MCH 33.5 11/09/2017 1349   MCHC 33.7 11/09/2017 1349   RDW 12.4 11/09/2017 1349   RDW WILL FOLLOW 11/10/2016 0910   RDW 12.7 11/10/2016 0910   LYMPHSABS 1.0 11/09/2017 1349   LYMPHSABS WILL FOLLOW 11/10/2016 0910   LYMPHSABS 1.2 11/10/2016 0910   MONOABS 0.5 11/09/2017 1349   EOSABS 0.1 11/09/2017 1349   EOSABS WILL FOLLOW 11/10/2016 0910   EOSABS 0.2 11/10/2016 0910   BASOSABS 0.0 11/09/2017 1349   BASOSABS WILL FOLLOW 11/10/2016 0910   BASOSABS 0.0 11/10/2016 0910    BMET    Component Value Date/Time   NA 140 11/09/2017 1358   NA 136 11/10/2016 0910   K 3.7 11/09/2017 1358   CL 105 11/09/2017 1358   CO2 24 11/09/2017 1349   GLUCOSE 97 11/09/2017 1358   BUN 15 11/09/2017 1358   BUN 24 11/10/2016 0910   CREATININE 1.00 11/09/2017 1358   CALCIUM 9.5 11/09/2017 1349   GFRNONAA >60 11/09/2017 1349   GFRAA >60 11/09/2017 1349    COAGS: Lab Results  Component Value Date   INR 1.08 06/26/2012   INR 7.07 (HH) 06/25/2012     Non-Invasive Vascular Imaging:    CTA pending.  ASSESSMENT/PLAN: This is a 72 y.o. male with back pain similar to his chronic back pain in the setting of a known 5.2 cm infrarenal AAA.   I have recommended a repeat CTA to rule out any interval growth in the last several weeks or stranding around the aneurysm.  He states his back pain is similar to previous episodes in the past but he was concerned since it lasted longer this time.  On evaluation in ED his pain is improving and he had no overt symptoms when I palpated his aneurysm. Discussed that I will likely admit him overnight for observation. Proposed that we make plans to fix  his aneurysm this admission given his history of intermittent back pain that may complicate the picture moving forward.  He would like to think about it and talk to his family pending results of the CT scan.  Marty Heck, MD [facility] Office: [phone] Pager:  [place]

## 2017-11-09 NOTE — ED Provider Notes (Signed)
[facility] EMERGENCY DEPARTMENT Provider Note   CSN: [id number] Arrival date & time: 11/09/17  1235     History   Chief Complaint Chief Complaint  Patient presents with  . Back Pain    HPI Juan Lynn is a 72 y.o. male.  The history is provided by the patient.  Back Pain   This is a new problem. The current episode started more than 2 days ago. The problem occurs constantly. The problem has not changed since onset.The pain is associated with no known injury. The pain is present in the lumbar spine. The quality of the pain is described as aching. The pain does not radiate. The pain is moderate. The pain is the same all the time. Pertinent negatives include no chest pain, no fever, no abdominal pain and no dysuria. He has tried heat and NSAIDs for the symptoms. The treatment provided mild relief. Risk factors: AAA.    Past Medical History:  Diagnosis Date  . BPH (benign prostatic hyperplasia)   . CAD (coronary artery disease) 06/25/2012   a. Inf STEMI s/p cath- DES-mid RCA b. Echo- EF 55%, basal inferior HK  . COPD (chronic obstructive pulmonary disease) ([place])   . OSA (obstructive sleep apnea)   . Tobacco abuse     Patient Active Problem List   Diagnosis Date Noted  . AAA (abdominal aortic aneurysm) ([place]) 11/09/2017  . Abdominal aortic aneurysm ([place]) 10/27/2017  . Hyperlipidemia 09/25/2014  . Left inguinal hernia 07/28/2013  . Tobacco abuse 06/27/2012  . CAD (coronary artery disease), native coronary artery 06/27/2012  . COPD GOLD I 06/27/2012  . OSA (obstructive sleep apnea) 06/27/2012  . Hyponatremia 06/27/2012  . ST elevation myocardial infarction (STEMI) of inferior wall, initial episode of care [facility]) 06/25/2012    Past Surgical History:  Procedure Laterality Date  . CATARACT EXTRACTION    . CORONARY ANGIOPLASTY WITH STENT PLACEMENT  06/25/2012   100% mid RCA s/p DES, 30% prox AVG Cx, mild luminal irregularities elsewhere; EF 55%  . HERNIA  REPAIR    . LEFT HEART CATHETERIZATION WITH CORONARY ANGIOGRAM N/A 06/25/2012   Procedure: LEFT HEART CATHETERIZATION WITH CORONARY ANGIOGRAM;  Surgeon: Burnell Blanks, MD;  Location: [facility] CATH LAB;  Service: Cardiovascular;  Laterality: N/A;  . PERCUTANEOUS CORONARY STENT INTERVENTION (PCI-S)  06/25/2012   Procedure: PERCUTANEOUS CORONARY STENT INTERVENTION (PCI-S);  Surgeon: Burnell Blanks, MD;  Location: [facility] CATH LAB;  Service: Cardiovascular;;        Home Medications    Prior to Admission medications   Medication Sig Start Date End Date Taking? Authorizing Provider  albuterol (PROVENTIL HFA;VENTOLIN HFA) 108 (90 BASE) MCG/ACT inhaler Inhale 2 puffs into the lungs every 6 (six) hours as needed for wheezing or shortness of breath. 05/17/14  Yes Tanda Rockers, MD  aspirin 81 MG chewable tablet Chew 1 tablet (81 mg total) by mouth daily. Patient taking differently: Chew 81 mg by mouth at bedtime.  06/27/12  Yes Arguello, Roger A, PA-C  atorvastatin (LIPITOR) 80 MG tablet Take 1 tablet (80 mg total) by mouth at bedtime. 11/24/16  Yes Burnell Blanks, MD  clopidogrel (PLAVIX) 75 MG tablet Take 1 tablet (75 mg total) by mouth daily. Patient taking differently: Take 75 mg by mouth at bedtime.  11/24/16  Yes Burnell Blanks, MD  finasteride (PROSCAR) 5 MG tablet Take 5 mg by mouth daily.   Yes [provider]  fluticasone-salmeterol (ADVAIR HFA) 115-21 MCG/ACT inhaler Inhale 2 puffs into  the lungs 2 (two) times daily. 10/05/14  Yes Tanda Rockers, MD  metoprolol tartrate (LOPRESSOR) 25 MG tablet Take 1 tablet (25 mg total) by mouth 2 (two) times daily. Patient taking differently: Take 50 mg by mouth at bedtime.  11/24/16  Yes Burnell Blanks, MD  Multiple Vitamin (MULTIVITAMIN WITH MINERALS) TABS tablet Take 1 tablet by mouth at bedtime.   Yes [provider]  nitroGLYCERIN (NITROSTAT) 0.4 MG SL tablet Place 1 tablet (0.4 mg total) under the tongue  every 5 (five) minutes as needed for chest pain. 11/24/16  Yes Burnell Blanks, MD  tamsulosin (FLOMAX) 0.4 MG CAPS Take 0.8 mg by mouth at bedtime.    Yes [provider]    Family History Family History  Problem Relation Age of Onset  . Heart attack Father   . CAD Father   . Pancreatic cancer Father   . Emphysema Maternal Grandfather        smoked heavily   . Stomach cancer Paternal Grandmother   . Prostate cancer Paternal Uncle     Social History Social History   Tobacco Use  . Smoking status: Former Smoker    Packs/day: 1.00    Years: 50.00    Pack years: 50.00    Types: Cigarettes    Last attempt to quit: 06/01/2012    Years since quitting: 5.4  . Smokeless tobacco: Never Used  Substance Use Topics  . Alcohol use: Yes    Comment: 3 beers daily  . Drug use: No     Allergies   Patient has no known allergies.   Review of Systems Review of Systems  Constitutional: Negative for chills and fever.  HENT: Negative for ear pain and sore throat.   Eyes: Negative for pain and visual disturbance.  Respiratory: Negative for cough and shortness of breath.   Cardiovascular: Negative for chest pain and palpitations.  Gastrointestinal: Negative for abdominal pain and vomiting.  Genitourinary: Negative for dysuria and hematuria.  Musculoskeletal: Positive for back pain. Negative for arthralgias.  Skin: Negative for color change and rash.  Neurological: Negative for seizures.  All other systems reviewed and are negative.    Physical Exam Updated Vital Signs BP 138/86 (BP Location: Left Arm)   Pulse 62   Temp 98.7 F (37.1 C) (Oral)   Resp 20   SpO2 96%   Physical Exam  Constitutional: He appears well-developed and well-nourished.  HENT:  Head: Normocephalic and atraumatic.  Eyes: Conjunctivae are normal.  Neck: Neck supple.  Cardiovascular: Normal rate and regular rhythm.  No murmur heard. Pulmonary/Chest: Effort normal and breath sounds normal.  No respiratory distress.  Abdominal: Soft. There is no tenderness.  Musculoskeletal: He exhibits no edema.  Neurological: He is alert.  Skin: Skin is warm and dry.  Psychiatric: He has a normal mood and affect.  Nursing note and vitals reviewed.    ED Treatments / Results  Labs (all labs ordered are listed, but only abnormal results are displayed) Labs Reviewed  COMPREHENSIVE METABOLIC PANEL - Abnormal; Notable for the following components:      Result Value   Glucose, Bld 101 (*)    All other components within normal limits  CBC WITH DIFFERENTIAL/PLATELET - Abnormal; Notable for the following components:   Platelets 147 (*)    All other components within normal limits  I-STAT CHEM 8, ED - Abnormal; Notable for the following components:   Calcium, Ion 1.10 (*)    TCO2 21 (*)  All other components within normal limits    EKG None  Radiology Ct Angio Chest/abd/pel For Dissection W And/or Wo Contrast  Result Date: 11/09/2017 CLINICAL DATA:  72 year old male with known abdominal aortic aneurysm. Mid back pain for the past 4 days. EXAM: CT ANGIOGRAPHY CHEST, ABDOMEN AND PELVIS TECHNIQUE: Multidetector CT imaging through the chest, abdomen and pelvis was performed using the standard protocol during bolus administration of intravenous contrast. Multiplanar reconstructed images and MIPs were obtained and reviewed to evaluate the vascular anatomy. CONTRAST:  137mL ISOVUE-370 IOPAMIDOL (ISOVUE-370) INJECTION 76% COMPARISON:  Relatively recent CTA of the abdomen and pelvis 10/30/2017 FINDINGS: CTA CHEST FINDINGS Cardiovascular: Initial unenhanced CT imaging of the chest demonstrates no evidence of acute intramural hematoma. Excellent opacification of the aorta and branch vessels following administration of intravenous contrast. Four vessel arch anatomy. The left vertebral artery arises directly from the aorta. No evidence of thoracic aortic aneurysm or dissection. Mild coronary artery  calcifications. The heart is within normal limits for size. No pericardial effusion. No evidence of central pulmonary embolus. Mediastinum/Nodes: Unremarkable CT appearance of the thyroid gland. No suspicious mediastinal or hilar adenopathy. No soft tissue mediastinal mass. The thoracic esophagus is unremarkable. Lungs/Pleura: Advanced combined paraseptal and centrilobular pulmonary emphysema. Diffuse mild lower lobe bronchial wall thickening. Trace dependent atelectasis. No edema, focal consolidation, pleural effusion or pneumothorax. No suspicious mass or nodule. Musculoskeletal: No acute fracture or aggressive appearing lytic or blastic osseous lesion. Review of the MIP images confirms the above findings. CTA ABDOMEN AND PELVIS FINDINGS VASCULAR Aorta: Stable fusiform aneurysmal dilatation of the infrarenal abdominal aorta with a maximal diameter of 5.2 cm. No evidence of periaortic inflammation or evidence of rupture. No evidence of acute dissection. Celiac: Patent without evidence of aneurysm, dissection, vasculitis or significant stenosis. SMA: Patent without evidence of aneurysm, dissection, vasculitis or significant stenosis. Renals: Single right-sided renal artery with mild calcified atherosclerotic plaque at the origin but no significant stenosis. On the left, there are 3 renal arteries including 2 small accessory renal arteries to the lower pole. No significant stenosis, dissection, aneurysm or evidence of FMD. IMA: Patent without evidence of aneurysm, dissection, vasculitis or significant stenosis. Inflow: Heterogeneous atherosclerotic plaque throughout the iliac vessels without dissection, aneurysm or high-grade stenosis. Mild ectasia of the right internal iliac artery with a maximal diameter of 1.1 cm. Proximal Outflow: Bilateral common femoral and visualized portions of the superficial and profunda femoral arteries are patent without evidence of aneurysm, dissection, vasculitis or significant  stenosis. Veins: No obvious venous abnormality within the limitations of this arterial phase study. Review of the MIP images confirms the above findings. NON-VASCULAR Hepatobiliary: Normal hepatic contour and morphology. No discrete hepatic lesions. Normal appearance of the gallbladder. No intra or extrahepatic biliary ductal dilatation. Pancreas: Unremarkable. No pancreatic ductal dilatation or surrounding inflammatory changes. Spleen: Normal in size without focal abnormality. Adrenals/Urinary Tract: Adrenal glands are unremarkable. No evidence of hydronephrosis or nephrolithiasis. Subtle 1.9 x 1.3 cm intermediate attenuation rounded lesion in the anterior aspect of the interpolar right kidney. Ureters and bladder are unremarkable. Stomach/Bowel: Colonic diverticular disease without CT evidence of active inflammation. No evidence of obstruction or focal bowel wall thickening. Normal appendix in the right lower quadrant. The terminal ileum is unremarkable. Lymphatic: No suspicious lymphadenopathy. Reproductive: Prostatomegaly. Other: Fat containing umbilical hernia. Musculoskeletal: No acute fracture or aggressive appearing lytic or blastic osseous lesion. Circumscribed fat attenuation lesion deep to the right latissimus dorsi muscle consistent with a submuscular lipoma. Focal L5-S1 degenerative disc disease. Review  of the MIP images confirms the above findings. IMPRESSION: CTA CHEST 1. No acute aortic dissection, aneurysm or other acute vascular abnormality. 2. 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 3. Coronary artery calcifications. 4. Mild lower lobe bronchial wall thickening suggesting chronic bronchitis. CTA ABD/PELVIS 1. Stable infrarenal abdominal aortic aneurysm again measuring 5.2 cm without evidence of acute change or complication. 2. Subtle 1.9 x 1.3 cm indeterminate lesion in the anterior aspect of the interpolar right kidney. Differential considerations include complex cyst  (favored) and renal neoplasm. Recommend further evaluation with renal protocol MRI of the abdomen in 3 months. 3. Additional ancillary findings as above without significant interval change. Signed, Criselda Peaches, MD, [place] [facility] Radiology Electronically Signed   By: Jacqulynn Cadet M.D.   On: 11/09/2017 17:18    Procedures Ultrasound ED Abd Date/Time: 11/09/2017 4:36 PM Performed by: Irven Baltimore, MD Authorized by: Irven Baltimore, MD   Procedure details:    Indications: back pain     Assessment for:  AAA   Aorta:  Visualized   Images: archived   Study Limitations: body habitus and bowel gas Vascular findings:    Aorta: aortic aneurysm     Aorta diameter:  5   Intra-abdominal fluid: No intra abdominal fluid.     (including critical care time)  Medications Ordered in ED Medications  iopamidol (ISOVUE-370) 76 % injection (has no administration in time range)  iopamidol (ISOVUE-370) 76 % injection (has no administration in time range)  ondansetron (ZOFRAN) injection 4 mg (has no administration in time range)  alum & mag hydroxide-simeth (MAALOX/MYLANTA) 200-200-20 MG/5ML suspension 15-30 mL (has no administration in time range)  pantoprazole (PROTONIX) EC tablet 40 mg (has no administration in time range)  labetalol (NORMODYNE,TRANDATE) injection 10 mg (has no administration in time range)  hydrALAZINE (APRESOLINE) injection 5 mg (has no administration in time range)  metoprolol tartrate (LOPRESSOR) injection 2-5 mg (has no administration in time range)  guaiFENesin-dextromethorphan (ROBITUSSIN DM) 100-10 MG/5ML syrup 15 mL (has no administration in time range)  phenol (CHLORASEPTIC) mouth spray 1 spray (has no administration in time range)  enoxaparin (LOVENOX) injection 40 mg (has no administration in time range)  iopamidol (ISOVUE-370) 76 % injection 100 mL (100 mLs Intravenous Contrast Given 11/09/17 1631)      Initial Impression / Assessment and Plan / ED Course  I have reviewed the triage vital signs and the nursing notes.  Pertinent labs & imaging results that were available during my care of the patient were reviewed by me and considered in my medical decision making (see chart for details).     The patient is a 72 year old male with past medical history of coronary artery disease who presents with low back pain.  The patient reports that the pain started 5 days ago and has been constant since that time.  The patient reports that he has had pain like this before, but it is more persistent than usual.  He was recently diagnosed with a abdominal aortic aneurysm and is concerned that this may be related to his new aneurysm diagnosis.  The patient called his vascular surgeon, Dr. Carlis Abbott, and was instructed to come to the emergency department.  Dr. Carlis Abbott evaluated the patient and agrees with current work-up of basic labs and CTA abdomen and pelvis.  CTA reveals no signs of worsening aneurysm or ruptured aorta.  Patient admitted for observation to vascular service.   Care was supervised by Dr. Tyrone Nine.  Irven Baltimore,  MD 6:31 PM   Final Clinical Impressions(s) / ED Diagnoses   Final diagnoses:  None    ED Discharge Orders    None       Irven Baltimore, MD 11/09/17 [place], Dan, DO 11/09/17 2313    Deno Etienne, DO 11/11/17 [place], DO 11/11/17 1712

## 2017-11-10 ENCOUNTER — Ambulatory Visit (HOSPITAL_BASED_OUTPATIENT_CLINIC_OR_DEPARTMENT_OTHER): Payer: Medicare Other

## 2017-11-10 DIAGNOSIS — K567 Ileus, unspecified: Secondary | ICD-10-CM | POA: Diagnosis not present

## 2017-11-10 DIAGNOSIS — I5030 Unspecified diastolic (congestive) heart failure: Secondary | ICD-10-CM

## 2017-11-10 DIAGNOSIS — Z9989 Dependence on other enabling machines and devices: Secondary | ICD-10-CM | POA: Diagnosis not present

## 2017-11-10 DIAGNOSIS — I252 Old myocardial infarction: Secondary | ICD-10-CM | POA: Diagnosis not present

## 2017-11-10 DIAGNOSIS — Z7951 Long term (current) use of inhaled steroids: Secondary | ICD-10-CM | POA: Diagnosis not present

## 2017-11-10 DIAGNOSIS — R402254 Coma scale, best verbal response, oriented, 24 hours or more after hospital admission: Secondary | ICD-10-CM | POA: Diagnosis not present

## 2017-11-10 DIAGNOSIS — Z87891 Personal history of nicotine dependence: Secondary | ICD-10-CM | POA: Diagnosis not present

## 2017-11-10 DIAGNOSIS — N400 Enlarged prostate without lower urinary tract symptoms: Secondary | ICD-10-CM | POA: Diagnosis present

## 2017-11-10 DIAGNOSIS — Z95828 Presence of other vascular implants and grafts: Secondary | ICD-10-CM | POA: Diagnosis not present

## 2017-11-10 DIAGNOSIS — J439 Emphysema, unspecified: Secondary | ICD-10-CM | POA: Diagnosis not present

## 2017-11-10 DIAGNOSIS — R402364 Coma scale, best motor response, obeys commands, 24 hours or more after hospital admission: Secondary | ICD-10-CM | POA: Diagnosis not present

## 2017-11-10 DIAGNOSIS — M545 Low back pain: Secondary | ICD-10-CM | POA: Diagnosis present

## 2017-11-10 DIAGNOSIS — I2510 Atherosclerotic heart disease of native coronary artery without angina pectoris: Secondary | ICD-10-CM | POA: Diagnosis not present

## 2017-11-10 DIAGNOSIS — G4733 Obstructive sleep apnea (adult) (pediatric): Secondary | ICD-10-CM | POA: Diagnosis present

## 2017-11-10 DIAGNOSIS — R030 Elevated blood-pressure reading, without diagnosis of hypertension: Secondary | ICD-10-CM | POA: Diagnosis present

## 2017-11-10 DIAGNOSIS — Z955 Presence of coronary angioplasty implant and graft: Secondary | ICD-10-CM | POA: Diagnosis not present

## 2017-11-10 DIAGNOSIS — Z7902 Long term (current) use of antithrombotics/antiplatelets: Secondary | ICD-10-CM | POA: Diagnosis not present

## 2017-11-10 DIAGNOSIS — Z79899 Other long term (current) drug therapy: Secondary | ICD-10-CM | POA: Diagnosis not present

## 2017-11-10 DIAGNOSIS — I714 Abdominal aortic aneurysm, without rupture: Secondary | ICD-10-CM | POA: Diagnosis present

## 2017-11-10 DIAGNOSIS — Z7982 Long term (current) use of aspirin: Secondary | ICD-10-CM | POA: Diagnosis not present

## 2017-11-10 DIAGNOSIS — G8929 Other chronic pain: Secondary | ICD-10-CM | POA: Diagnosis present

## 2017-11-10 DIAGNOSIS — R402144 Coma scale, eyes open, spontaneous, 24 hours or more after hospital admission: Secondary | ICD-10-CM | POA: Diagnosis not present

## 2017-11-10 LAB — ECHOCARDIOGRAM COMPLETE
Height: 67 in
Weight: 2927.71 oz

## 2017-11-10 LAB — COMPREHENSIVE METABOLIC PANEL
ALBUMIN: 3.8 g/dL (ref 3.5–5.0)
ALT: 20 U/L (ref 0–44)
AST: 29 U/L (ref 15–41)
Alkaline Phosphatase: 87 U/L (ref 38–126)
Anion gap: 9 (ref 5–15)
BILIRUBIN TOTAL: 1.3 mg/dL — AB (ref 0.3–1.2)
BUN: 13 mg/dL (ref 8–23)
CHLORIDE: 106 mmol/L (ref 98–111)
CO2: 26 mmol/L (ref 22–32)
CREATININE: 1.16 mg/dL (ref 0.61–1.24)
Calcium: 9.1 mg/dL (ref 8.9–10.3)
GFR calc Af Amer: >60 mL/min (ref >60)
GLUCOSE: 98 mg/dL (ref 70–99)
POTASSIUM: 3.9 mmol/L (ref 3.5–5.1)
Sodium: 141 mmol/L (ref 135–145)
TOTAL PROTEIN: 6.9 g/dL (ref 6.5–8.1)

## 2017-11-10 LAB — CBC
HEMATOCRIT: 48.4 % (ref 39.0–52.0)
Hemoglobin: 16.2 g/dL (ref 13.0–17.0)
MCH: 33.5 pg (ref 26.0–34.0)
MCHC: 33.5 g/dL (ref 30.0–36.0)
MCV: 100.2 fL — AB (ref 78.0–100.0)
Platelets: 153 10*3/uL (ref 150–400)
RBC: 4.83 MIL/uL (ref 4.22–5.81)
RDW: 12.5 % (ref 11.5–15.5)
WBC: 7.1 10*3/uL (ref 4.0–10.5)

## 2017-11-10 LAB — SURGICAL PCR SCREEN
MRSA, PCR: NEGATIVE
Staphylococcus aureus: POSITIVE — AB

## 2017-11-10 MED ORDER — SODIUM CHLORIDE 0.9 % IV SOLN
INTRAVENOUS | Status: DC
  Administered 2017-11-10 – 2017-11-11 (×2): via INTRAVENOUS

## 2017-11-10 MED ORDER — CLOPIDOGREL BISULFATE 75 MG PO TABS
75.0000 mg | ORAL_TABLET | Freq: Every day | ORAL | Status: DC
  Administered 2017-11-10 – 2017-11-12 (×3): 75 mg via ORAL
  Filled 2017-11-10 (×3): qty 1

## 2017-11-10 MED ORDER — ASPIRIN 81 MG PO CHEW
81.0000 mg | CHEWABLE_TABLET | Freq: Every day | ORAL | Status: DC
  Administered 2017-11-10 – 2017-11-12 (×3): 81 mg via ORAL
  Filled 2017-11-10 (×3): qty 1

## 2017-11-10 MED ORDER — FINASTERIDE 5 MG PO TABS: 5.0000 mg | ORAL_TABLET | Freq: Every day | ORAL | Status: DC

## 2017-11-10 MED ORDER — TAMSULOSIN HCL 0.4 MG PO CAPS: 0.8000 mg | ORAL_CAPSULE | Freq: Every day | ORAL | Status: DC

## 2017-11-10 MED ORDER — METOPROLOL TARTRATE 50 MG PO TABS
50.0000 mg | ORAL_TABLET | Freq: Every day | ORAL | Status: DC
  Administered 2017-11-10 – 2017-11-11 (×2): 50 mg via ORAL
  Filled 2017-11-10 (×2): qty 1

## 2017-11-10 MED ORDER — SODIUM CHLORIDE 0.9 % IV SOLN: INTRAVENOUS | Status: DC

## 2017-11-10 MED ORDER — ALBUTEROL SULFATE (2.5 MG/3ML) 0.083% IN NEBU: 3.0000 mL | INHALATION_SOLUTION | Freq: Four times a day (QID) | RESPIRATORY_TRACT | Status: DC | PRN

## 2017-11-10 MED ORDER — FLUTICASONE FUROATE-VILANTEROL 200-25 MCG/INH IN AEPB
1.0000 | INHALATION_SPRAY | Freq: Every day | RESPIRATORY_TRACT | Status: DC
  Administered 2017-11-12: 1 via RESPIRATORY_TRACT
  Filled 2017-11-10: qty 28

## 2017-11-10 NOTE — Progress Notes (Signed)
  Echocardiogram 2D Echocardiogram has been performed.  Juan Lynn 11/10/2017, 9:51 AM

## 2017-11-10 NOTE — Progress Notes (Addendum)
[facility] [place]  Subjective  - Doing fine, no new complaints.   Objective 120/74 70 98.2 F (36.8 C) (Oral) 18 97% No intake or output data in the 24 hours ending 11/10/17 0718  Palpable pedal pulses Abdomin soft, NTTP, palpable AAA BP controlled 120/74 Heart RRR Lungs non labored breathing  Assessment/Planning: 5.2 cm infrarenal AAA  Pending Echo Plan for EVAR tomorrow 11/11/2017 No lumbar or abdominal pain   Roxy Horseman 11/10/2017 7:18 AM --  Laboratory Lab Results: Recent Labs    11/09/17 1349 11/09/17 1358  WBC 7.6  --   HGB 16.7 16.3  HCT 49.6 48.0  PLT 147*  --    BMET Recent Labs    11/09/17 1349 11/09/17 1358  NA 140 140  K 3.8 3.7  CL 107 105  CO2 24  --   GLUCOSE 101* 97  BUN 13 15  CREATININE 1.02 1.00  CALCIUM 9.5  --     COAG Lab Results  Component Value Date   INR 1.08 06/26/2012   INR 7.07 (HH) 06/25/2012   No results found for: PTT   I have seen and evaluated the patient. I agree with the PA note as documented above. No interval change on CT on my review.  Still 5.2 cm infrarenal saccular aneurysm.  Will proceed with EVAR tomorrow given intermittent back pain symptoms that will complicate picture moving forward.  Echo today given hx of MI in 2014.  OK to continue aspirin and Plavix.  Marty Heck, MD [facility] [place] Office: [phone] Pager: [phone]

## 2017-11-11 ENCOUNTER — Inpatient Hospital Stay (HOSPITAL_COMMUNITY): Payer: Medicare Other

## 2017-11-11 ENCOUNTER — Encounter (HOSPITAL_COMMUNITY): Payer: Self-pay | Admitting: Surgery

## 2017-11-11 ENCOUNTER — Encounter (HOSPITAL_COMMUNITY)
Admission: EM | Disposition: A | Discharge status: Home / Self Care | Payer: Self-pay | Source: Home / Self Care | Attending: Vascular Surgery

## 2017-11-11 HISTORY — PX: ABDOMINAL AORTIC ENDOVASCULAR STENT GRAFT: SHX5707

## 2017-11-11 LAB — POCT ACTIVATED CLOTTING TIME: Activated Clotting Time: 241 seconds

## 2017-11-11 LAB — CBC
HCT: 44.4 % (ref 39.0–52.0)
HEMATOCRIT: 43.2 % (ref 39.0–52.0)
HEMOGLOBIN: 14.7 g/dL (ref 13.0–17.0)
Hemoglobin: 15.1 g/dL (ref 13.0–17.0)
MCH: 34.2 pg — AB (ref 26.0–34.0)
MCH: 34 pg (ref 26.0–34.0)
MCHC: 34 g/dL (ref 30.0–36.0)
MCHC: 34 g/dL (ref 30.0–36.0)
MCV: 100.5 fL — AB (ref 78.0–100.0)
MCV: 100 fL (ref 78.0–100.0)
PLATELETS: 147 10*3/uL — AB (ref 150–400)
Platelets: 135 10*3/uL — ABNORMAL LOW (ref 150–400)
RBC: 4.42 MIL/uL (ref 4.22–5.81)
RBC: 4.32 MIL/uL (ref 4.22–5.81)
RDW: 12.3 % (ref 11.5–15.5)
RDW: 12.2 % (ref 11.5–15.5)
WBC: 6.5 10*3/uL (ref 4.0–10.5)
WBC: 7.3 10*3/uL (ref 4.0–10.5)

## 2017-11-11 LAB — BASIC METABOLIC PANEL
ANION GAP: 5 (ref 5–15)
Anion gap: 8 (ref 5–15)
BUN: 15 mg/dL (ref 8–23)
BUN: 9 mg/dL (ref 8–23)
CALCIUM: 8.3 mg/dL — AB (ref 8.9–10.3)
CHLORIDE: 110 mmol/L (ref 98–111)
CO2: 25 mmol/L (ref 22–32)
CO2: 25 mmol/L (ref 22–32)
CREATININE: 1.1 mg/dL (ref 0.61–1.24)
Calcium: 8.5 mg/dL — ABNORMAL LOW (ref 8.9–10.3)
Chloride: 107 mmol/L (ref 98–111)
Creatinine, Ser: 0.95 mg/dL (ref 0.61–1.24)
GFR calc Af Amer: >60 mL/min (ref >60)
GFR calc non Af Amer: >60 mL/min (ref >60)
GFR calc non Af Amer: >60 mL/min (ref >60)
GLUCOSE: 94 mg/dL (ref 70–99)
GLUCOSE: 102 mg/dL — AB (ref 70–99)
Potassium: 3.9 mmol/L (ref 3.5–5.1)
Potassium: 3.7 mmol/L (ref 3.5–5.1)
SODIUM: 140 mmol/L (ref 135–145)
Sodium: 140 mmol/L (ref 135–145)

## 2017-11-11 LAB — ABO/RH: ABO/RH(D): O NEG

## 2017-11-11 LAB — TYPE AND SCREEN
ABO/RH(D): O NEG
Antibody Screen: NEGATIVE

## 2017-11-11 LAB — PROTIME-INR
INR: 1.13
PROTHROMBIN TIME: 14.4 s (ref 11.4–15.2)

## 2017-11-11 LAB — APTT: aPTT: 40 seconds — ABNORMAL HIGH (ref 24–36)

## 2017-11-11 LAB — MAGNESIUM: Magnesium: 1.8 mg/dL (ref 1.7–2.4)

## 2017-11-11 IMAGING — DX DG ABD PORTABLE 1V
1 series · 1 of 1 positions shown · non-contrast
Comparison: [DATE]

CLINICAL DATA: Abdominal aortic stent graft

EXAM:
PORTABLE ABDOMEN - 1 VIEW

[abdomen]
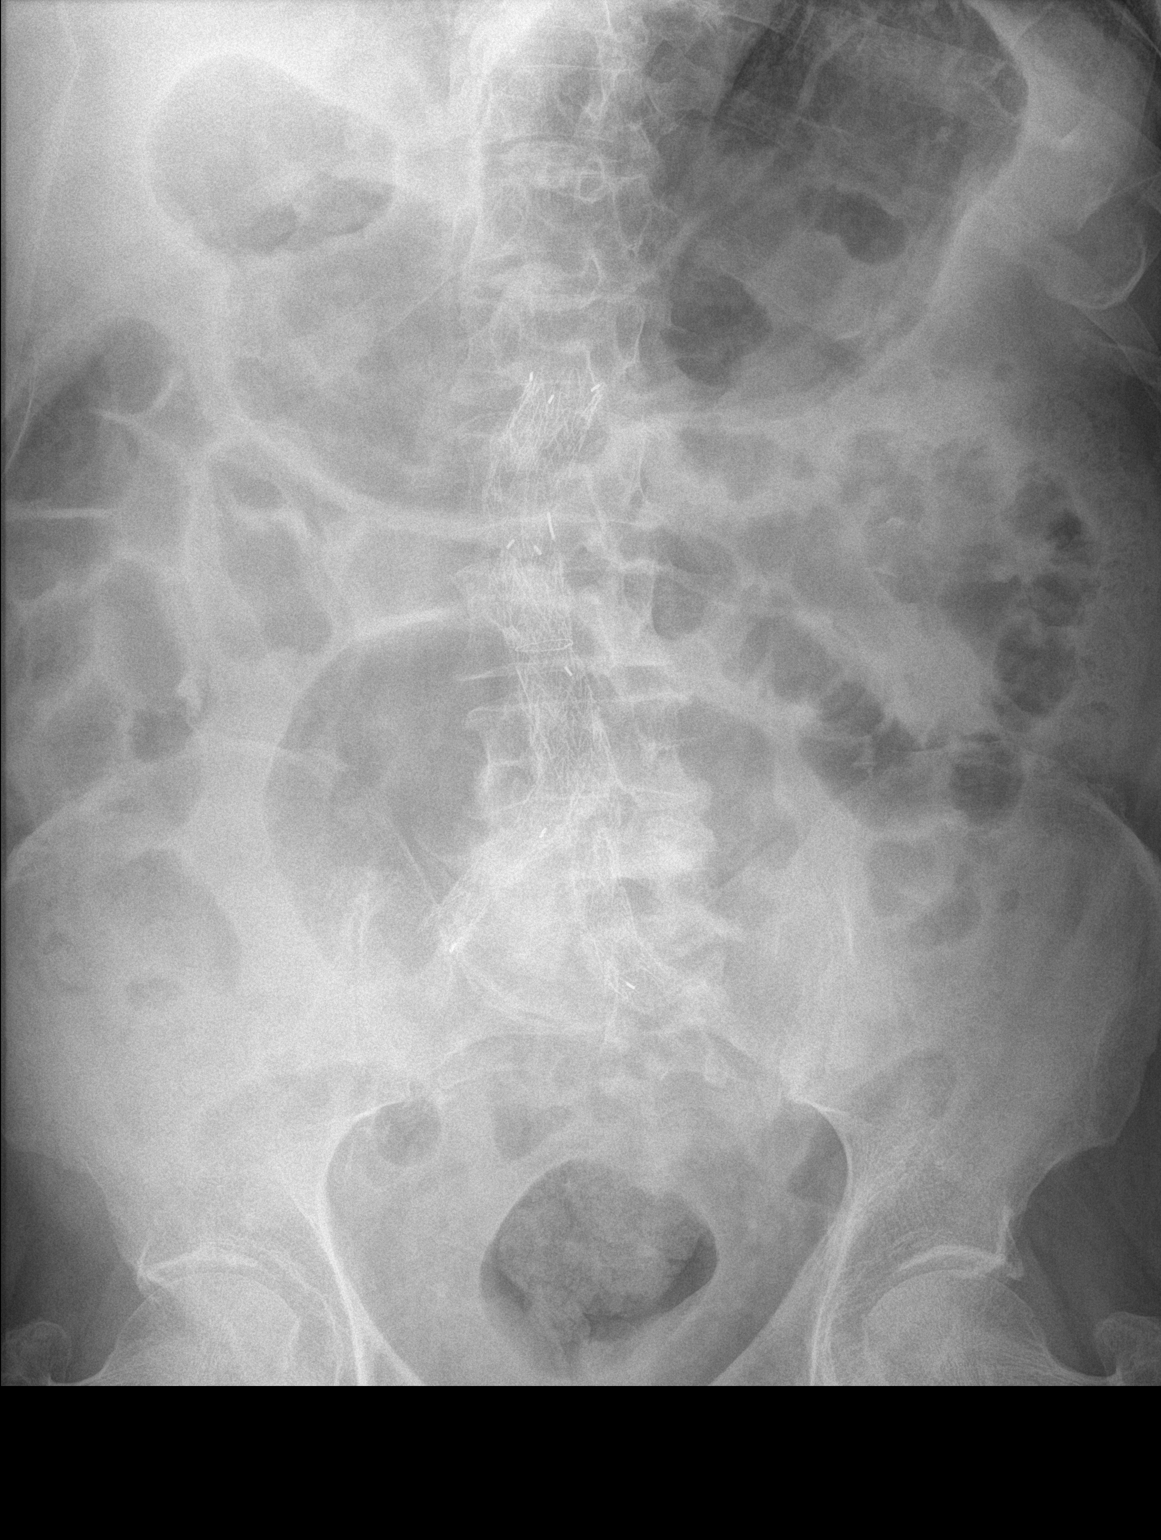

[1 of 1 positions shown; findings below may reference images not displayed]

FINDINGS: Aorto bi-iliac stent graft has been placed. Generalized instead shin
of small bowel, large bowel, and stomach. No obvious free
intraperitoneal gas.
IMPRESSION: Postoperative changes.  Ileus.

## 2017-11-11 SURGERY — INSERTION, ENDOVASCULAR STENT GRAFT, AORTA, ABDOMINAL
Anesthesia: General

## 2017-11-11 MED ORDER — SUGAMMADEX SODIUM 200 MG/2ML IV SOLN
INTRAVENOUS | Status: DC | PRN
  Administered 2017-11-11: 200 mg via INTRAVENOUS

## 2017-11-11 MED ORDER — LIDOCAINE 2% (20 MG/ML) 5 ML SYRINGE
INTRAMUSCULAR | Status: AC
  Filled 2017-11-11: qty 5

## 2017-11-11 MED ORDER — SODIUM CHLORIDE 0.9 % IV SOLN
INTRAVENOUS | Status: DC | PRN
  Administered 2017-11-11: 25 ug/min via INTRAVENOUS

## 2017-11-11 MED ORDER — SODIUM CHLORIDE 0.9 % IV SOLN
INTRAVENOUS | Status: DC
  Administered 2017-11-11: 21:00:00 via INTRAVENOUS

## 2017-11-11 MED ORDER — LIDOCAINE 2% (20 MG/ML) 5 ML SYRINGE
INTRAMUSCULAR | Status: DC | PRN
  Administered 2017-11-11: 80 mg via INTRAVENOUS

## 2017-11-11 MED ORDER — DOCUSATE SODIUM 100 MG PO CAPS
100.0000 mg | ORAL_CAPSULE | Freq: Every day | ORAL | Status: DC
  Administered 2017-11-12: 100 mg via ORAL
  Filled 2017-11-11: qty 1

## 2017-11-11 MED ORDER — SODIUM CHLORIDE 0.9 % IV SOLN
INTRAVENOUS | Status: DC | PRN
  Administered 2017-11-11: 500 mL

## 2017-11-11 MED ORDER — PROPOFOL 10 MG/ML IV BOLUS
INTRAVENOUS | Status: DC | PRN
  Administered 2017-11-11: 150 mg via INTRAVENOUS
  Administered 2017-11-11: 50 mg via INTRAVENOUS

## 2017-11-11 MED ORDER — SODIUM CHLORIDE 0.9 % IV SOLN: 500.0000 mL | Freq: Once | INTRAVENOUS | Status: DC | PRN

## 2017-11-11 MED ORDER — OXYCODONE HCL 5 MG PO TABS
5.0000 mg | ORAL_TABLET | ORAL | Status: DC | PRN
  Administered 2017-11-11 – 2017-11-12 (×3): 10 mg via ORAL
  Filled 2017-11-11 (×3): qty 2

## 2017-11-11 MED ORDER — MIDAZOLAM HCL 2 MG/2ML IJ SOLN
INTRAMUSCULAR | Status: AC
  Filled 2017-11-11: qty 2

## 2017-11-11 MED ORDER — MUPIROCIN 2 % EX OINT
1.0000 "application " | TOPICAL_OINTMENT | Freq: Two times a day (BID) | CUTANEOUS | Status: DC
  Administered 2017-11-11 (×3): 1 via NASAL
  Filled 2017-11-11 (×4): qty 22

## 2017-11-11 MED ORDER — HYDROMORPHONE HCL 1 MG/ML IJ SOLN: 0.5000 mg | INTRAMUSCULAR | Status: DC | PRN

## 2017-11-11 MED ORDER — PROTAMINE SULFATE 10 MG/ML IV SOLN
INTRAVENOUS | Status: AC
  Filled 2017-11-11: qty 5

## 2017-11-11 MED ORDER — ARTIFICIAL TEARS OPHTHALMIC OINT
TOPICAL_OINTMENT | OPHTHALMIC | Status: AC
  Filled 2017-11-11: qty 3.5

## 2017-11-11 MED ORDER — SENNOSIDES-DOCUSATE SODIUM 8.6-50 MG PO TABS: 1.0000 | ORAL_TABLET | Freq: Every evening | ORAL | Status: DC | PRN

## 2017-11-11 MED ORDER — HEPARIN SODIUM (PORCINE) 1000 UNIT/ML IJ SOLN
INTRAMUSCULAR | Status: AC
  Filled 2017-11-11: qty 1

## 2017-11-11 MED ORDER — CEFAZOLIN SODIUM-DEXTROSE 2-3 GM-%(50ML) IV SOLR
INTRAVENOUS | Status: DC | PRN
  Administered 2017-11-11: 2 g via INTRAVENOUS

## 2017-11-11 MED ORDER — 0.9 % SODIUM CHLORIDE (POUR BTL) OPTIME
TOPICAL | Status: DC | PRN
  Administered 2017-11-11: 1000 mL

## 2017-11-11 MED ORDER — FENTANYL CITRATE (PF) 100 MCG/2ML IJ SOLN
INTRAMUSCULAR | Status: DC | PRN
  Administered 2017-11-11 (×3): 50 ug via INTRAVENOUS
  Administered 2017-11-11: 100 ug via INTRAVENOUS

## 2017-11-11 MED ORDER — ACETAMINOPHEN 325 MG PO TABS: 325.0000 mg | ORAL_TABLET | ORAL | Status: DC | PRN

## 2017-11-11 MED ORDER — ONDANSETRON HCL 4 MG/2ML IJ SOLN
INTRAMUSCULAR | Status: DC | PRN
  Administered 2017-11-11: 4 mg via INTRAVENOUS

## 2017-11-11 MED ORDER — LACTATED RINGERS IV SOLN
INTRAVENOUS | Status: DC | PRN
  Administered 2017-11-11: 15:00:00 via INTRAVENOUS

## 2017-11-11 MED ORDER — ONDANSETRON HCL 4 MG/2ML IJ SOLN
INTRAMUSCULAR | Status: AC
  Filled 2017-11-11: qty 2

## 2017-11-11 MED ORDER — ONDANSETRON HCL 4 MG/2ML IJ SOLN: INTRAMUSCULAR | Status: DC | PRN

## 2017-11-11 MED ORDER — IODIXANOL 320 MG/ML IV SOLN
INTRAVENOUS | Status: DC | PRN
  Administered 2017-11-11: 150 mL via INTRAVENOUS

## 2017-11-11 MED ORDER — MIDAZOLAM HCL 5 MG/5ML IJ SOLN
INTRAMUSCULAR | Status: DC | PRN
  Administered 2017-11-11: 2 mg via INTRAVENOUS

## 2017-11-11 MED ORDER — HEPARIN SODIUM (PORCINE) 1000 UNIT/ML IJ SOLN
INTRAMUSCULAR | Status: DC | PRN
  Administered 2017-11-11: 2000 [IU] via INTRAVENOUS
  Administered 2017-11-11: 8000 [IU] via INTRAVENOUS

## 2017-11-11 MED ORDER — NITROGLYCERIN 0.4 MG SL SUBL: 0.4000 mg | SUBLINGUAL_TABLET | SUBLINGUAL | Status: DC | PRN

## 2017-11-11 MED ORDER — FENTANYL CITRATE (PF) 250 MCG/5ML IJ SOLN
INTRAMUSCULAR | Status: AC
  Filled 2017-11-11: qty 5

## 2017-11-11 MED ORDER — ROCURONIUM BROMIDE 100 MG/10ML IV SOLN
INTRAVENOUS | Status: DC | PRN
  Administered 2017-11-11: 50 mg via INTRAVENOUS

## 2017-11-11 MED ORDER — CHLORHEXIDINE GLUCONATE CLOTH 2 % EX PADS
6.0000 | MEDICATED_PAD | Freq: Every day | CUTANEOUS | Status: DC
  Administered 2017-11-11: 6 via TOPICAL

## 2017-11-11 MED ORDER — MAGNESIUM SULFATE 2 GM/50ML IV SOLN: 2.0000 g | Freq: Every day | INTRAVENOUS | Status: DC | PRN

## 2017-11-11 MED ORDER — LABETALOL HCL 5 MG/ML IV SOLN: 10.0000 mg | INTRAVENOUS | Status: DC | PRN

## 2017-11-11 MED ORDER — POTASSIUM CHLORIDE CRYS ER 20 MEQ PO TBCR: 20.0000 meq | EXTENDED_RELEASE_TABLET | Freq: Every day | ORAL | Status: DC | PRN

## 2017-11-11 MED ORDER — ACETAMINOPHEN 325 MG RE SUPP: 325.0000 mg | RECTAL | Status: DC | PRN

## 2017-11-11 MED ORDER — CEFAZOLIN SODIUM-DEXTROSE 2-4 GM/100ML-% IV SOLN
2.0000 g | Freq: Three times a day (TID) | INTRAVENOUS | Status: AC
  Administered 2017-11-11 – 2017-11-12 (×2): 2 g via INTRAVENOUS
  Filled 2017-11-11 (×2): qty 100

## 2017-11-11 MED ORDER — FENTANYL CITRATE (PF) 100 MCG/2ML IJ SOLN: 25.0000 ug | INTRAMUSCULAR | Status: DC | PRN

## 2017-11-11 MED ORDER — ROCURONIUM BROMIDE 50 MG/5ML IV SOSY
PREFILLED_SYRINGE | INTRAVENOUS | Status: AC
  Filled 2017-11-11: qty 15

## 2017-11-11 MED ORDER — CEFAZOLIN SODIUM-DEXTROSE 2-4 GM/100ML-% IV SOLN
INTRAVENOUS | Status: AC
  Filled 2017-11-11: qty 100

## 2017-11-11 MED ORDER — SODIUM CHLORIDE 0.9 % IV SOLN
INTRAVENOUS | Status: AC
  Filled 2017-11-11: qty 1.2

## 2017-11-11 MED ORDER — LABETALOL HCL 5 MG/ML IV SOLN
INTRAVENOUS | Status: DC | PRN
  Administered 2017-11-11 (×2): 10 mg via INTRAVENOUS

## 2017-11-11 MED ORDER — PROPOFOL 10 MG/ML IV BOLUS
INTRAVENOUS | Status: AC
  Filled 2017-11-11: qty 20

## 2017-11-11 MED ORDER — ATORVASTATIN CALCIUM 80 MG PO TABS
80.0000 mg | ORAL_TABLET | Freq: Every day | ORAL | Status: DC
  Administered 2017-11-11: 80 mg via ORAL
  Filled 2017-11-11: qty 1

## 2017-11-11 MED ORDER — PROTAMINE SULFATE 10 MG/ML IV SOLN
INTRAVENOUS | Status: DC | PRN
  Administered 2017-11-11: 20 mg via INTRAVENOUS
  Administered 2017-11-11 (×2): 10 mg via INTRAVENOUS

## 2017-11-11 MED ORDER — BISACODYL 5 MG PO TBEC: 5.0000 mg | DELAYED_RELEASE_TABLET | Freq: Every day | ORAL | Status: DC | PRN

## 2017-11-11 MED ORDER — ONDANSETRON HCL 4 MG/2ML IJ SOLN: 4.0000 mg | Freq: Once | INTRAMUSCULAR | Status: DC | PRN

## 2017-11-11 SURGICAL SUPPLY — 71 items
CANISTER SUCT 3000ML PPV (MISCELLANEOUS) ×3 IMPLANT
CATH ANGIO 5F BER2 65CM (CATHETERS) IMPLANT
CATH BEACON 5 .035 65 KMP TIP (CATHETERS) ×3 IMPLANT
CATH BEACON 5.038 65CM KMP-01 (CATHETERS) IMPLANT
CATH OMNI FLUSH .035X70CM (CATHETERS) ×3 IMPLANT
COVER PROBE W GEL 5X96 (DRAPES) ×3 IMPLANT
DERMABOND ADHESIVE PROPEN (GAUZE/BANDAGES/DRESSINGS) ×4
DERMABOND ADVANCED (GAUZE/BANDAGES/DRESSINGS) ×2
DERMABOND ADVANCED .7 DNX12 (GAUZE/BANDAGES/DRESSINGS) ×1 IMPLANT
DERMABOND ADVANCED .7 DNX6 (GAUZE/BANDAGES/DRESSINGS) ×2 IMPLANT
DEVICE CLOSURE PERCLS PRGLD 6F (VASCULAR PRODUCTS) ×4 IMPLANT
DEVICE TORQUE KENDALL .025-038 (MISCELLANEOUS) ×3 IMPLANT
DRSG TEGADERM 2-3/8X2-3/4 SM (GAUZE/BANDAGES/DRESSINGS) ×6 IMPLANT
DRYSEAL FLEXSHEATH 12FR 33CM (SHEATH) ×1
DRYSEAL FLEXSHEATH 12FR 45CM (SHEATH) ×2
DRYSEAL FLEXSHEATH 12FR, 33CM (SHEATH) ×3 IMPLANT
DRYSEAL FLEXSHEATH 16FR 33CM (SHEATH) ×2
ELECT CAUTERY BLADE 6.4 (BLADE) ×3 IMPLANT
ELECT REM PT RETURN 9FT ADLT (ELECTROSURGICAL) ×6
ELECTRODE REM PT RTRN 9FT ADLT (ELECTROSURGICAL) ×2 IMPLANT
EXCLUDER TNK LEG 26MX14X16 (Endovascular Graft) ×1 IMPLANT
EXCLUDER TRUNK LEG 26MX14X16 (Endovascular Graft) ×3 IMPLANT
GAUZE SPONGE 2X2 8PLY STRL LF (GAUZE/BANDAGES/DRESSINGS) ×2 IMPLANT
GLOVE BIO SURGEON STRL SZ 6.5 (GLOVE) ×2 IMPLANT
GLOVE BIO SURGEON STRL SZ7.5 (GLOVE) ×3 IMPLANT
GLOVE BIO SURGEONS STRL SZ 6.5 (GLOVE) ×1
GLOVE BIOGEL PI IND STRL 6.5 (GLOVE) ×1 IMPLANT
GLOVE BIOGEL PI IND STRL 8 (GLOVE) ×1 IMPLANT
GLOVE BIOGEL PI INDICATOR 6.5 (GLOVE) ×2
GLOVE BIOGEL PI INDICATOR 8 (GLOVE) ×2
GOWN STRL REUS W/ TWL LRG LVL3 (GOWN DISPOSABLE) ×3 IMPLANT
GOWN STRL REUS W/ TWL XL LVL3 (GOWN DISPOSABLE) ×1 IMPLANT
GOWN STRL REUS W/TWL LRG LVL3 (GOWN DISPOSABLE) ×6
GOWN STRL REUS W/TWL XL LVL3 (GOWN DISPOSABLE) ×2
GRAFT BALLN CATH 65CM (STENTS) ×1 IMPLANT
GUIDEWIRE ANGLED .035X150CM (WIRE) ×3 IMPLANT
KIT BASIN OR (CUSTOM PROCEDURE TRAY) ×3 IMPLANT
KIT TURNOVER KIT B (KITS) ×3 IMPLANT
LEG CONTRALETERAL16X16X11.5 (Endovascular Graft) ×2 IMPLANT
NEEDLE PERC 18GX7CM (NEEDLE) ×3 IMPLANT
NS IRRIG 1000ML POUR BTL (IV SOLUTION) ×3 IMPLANT
PACK ENDOVASCULAR (PACKS) ×3 IMPLANT
PAD ARMBOARD 7.5X6 YLW CONV (MISCELLANEOUS) ×6 IMPLANT
PENCIL BUTTON HOLSTER BLD 10FT (ELECTRODE) ×3 IMPLANT
PERCLOSE PROGLIDE 6F (VASCULAR PRODUCTS) ×12
SET MICROPUNCTURE 5F STIFF (MISCELLANEOUS) ×6 IMPLANT
SHEATH AVANTI 11CM 8FR (SHEATH) IMPLANT
SHEATH BRITE TIP 8FR 23CM (SHEATH) ×3 IMPLANT
SHEATH DRYSEAL FLEX 12FR 33CM (SHEATH) ×1 IMPLANT
SHEATH DRYSEAL FLEX 12FR 45CM (SHEATH) ×1 IMPLANT
SHEATH DRYSEAL FLEX 16FR 33CM (SHEATH) ×1 IMPLANT
SHEATH PINNACLE 8F 10CM (SHEATH) ×3 IMPLANT
SPONGE GAUZE 2X2 STER 10/PKG (GAUZE/BANDAGES/DRESSINGS) ×4
STAPLER VISISTAT 35W (STAPLE) IMPLANT
STENT GRAFT BALLN CATH 65CM (STENTS) ×2
STENT GRAFT CONTRALAT 16X11.5 (Endovascular Graft) ×1 IMPLANT
STOPCOCK MORSE 400PSI 3WAY (MISCELLANEOUS) ×3 IMPLANT
SUT ETHILON 3 0 PS 1 (SUTURE) IMPLANT
SUT MNCRL AB 4-0 PS2 18 (SUTURE) ×6 IMPLANT
SUT PROLENE 5 0 C 1 24 (SUTURE) IMPLANT
SUT VIC AB 2-0 CTX 36 (SUTURE) IMPLANT
SUT VIC AB 3-0 SH 27 (SUTURE)
SUT VIC AB 3-0 SH 27X BRD (SUTURE) IMPLANT
SYR 30ML LL (SYRINGE) ×3 IMPLANT
TAPE STRIPS DRAPE STRL (GAUZE/BANDAGES/DRESSINGS) ×3 IMPLANT
TOWEL GREEN STERILE (TOWEL DISPOSABLE) ×3 IMPLANT
TRAY FOLEY MTR SLVR 16FR STAT (SET/KITS/TRAYS/PACK) ×3 IMPLANT
TUBING HIGH PRESSURE 120CM (CONNECTOR) ×3 IMPLANT
WIRE AMPLATZ SS-J .035X180CM (WIRE) ×6 IMPLANT
WIRE BENTSON .035X145CM (WIRE) ×9 IMPLANT
WIRE TORQFLEX AUST .018X40CM (WIRE) ×3 IMPLANT

## 2017-11-11 NOTE — Progress Notes (Signed)
[facility] [place]  Subjective  - No acute events overnight.  OR today for EVAR.   Objective 118/73 (!) 52 97.6 F (36.4 C) (Oral) 19 93%  Intake/Output Summary (Last 24 hours) at 11/11/2017 1041 Last data filed at 11/11/2017 0127 Gross per 24 hour  Intake 1471.02 ml  Output -  Net 1471.02 ml    Palpable pedal pulses Abdomin soft, NTTP, palpable AAA Heart RRR Lungs non labored breathing  Assessment/Planning: 5.2 cm infrarenal AAA with vague back pain that he says is improving.  Echo ok yesterday.  Plan for EVAR this afternoon.     Juan Lynn 11/11/2017 10:41 AM --  Laboratory Lab Results: Recent Labs    11/10/17 0825 11/11/17 0324  WBC 7.1 6.5  HGB 16.2 15.1  HCT 48.4 44.4  PLT 153 147*   BMET Recent Labs    11/10/17 0825 11/11/17 0324  NA 141 140  K 3.9 3.9  CL 106 107  CO2 26 25  GLUCOSE 98 94  BUN 13 15  CREATININE 1.16 1.10  CALCIUM 9.1 8.5*    COAG Lab Results  Component Value Date   INR 1.08 06/26/2012   INR 7.07 (HH) 06/25/2012   No results found for: PTT  Juan Heck, MD [facility] [place] Office: [phone] Pager: [phone]

## 2017-11-11 NOTE — Anesthesia Procedure Notes (Deleted)
Performed by: Brylie Sneath A, CRNA       

## 2017-11-11 NOTE — Anesthesia Procedure Notes (Signed)
Procedure Name: Intubation Date/Time: 11/11/2017 2:50 PM Performed by: Josephine Igo, CRNA Pre-anesthesia Checklist: Patient identified, Emergency Drugs available, Suction available and Patient being monitored Patient Re-evaluated:Patient Re-evaluated prior to induction Oxygen Delivery Method: Circle system utilized Preoxygenation: Pre-oxygenation with 100% oxygen Induction Type: IV induction Ventilation: Oral airway inserted - appropriate to patient size and Mask ventilation without difficulty Laryngoscope Size: Miller and 2 Tube type: Oral Tube size: 7.5 mm Number of attempts: 2 Airway Equipment and Method: Stylet and Video-laryngoscopy Placement Confirmation: ETT inserted through vocal cords under direct vision,  positive ETCO2 and breath sounds checked- equal and bilateral Secured at: 23 cm Tube secured with: Tape Dental Injury: Teeth and Oropharynx as per pre-operative assessment  Comments: DL x 1 Raahim Shartzer CRNA Grade III- esophageal intubation, recognized immediately. Video Laryngoscopy x1 Princetta Uplinger CRNA- endotracheal intubation; +ETCO2; BS checked [place] MD.

## 2017-11-11 NOTE — Plan of Care (Signed)
  Problem: Health Behavior/Discharge Planning: Goal: Ability to manage health-related needs will improve Outcome: Progressing   Problem: Clinical Measurements: Goal: Ability to maintain clinical measurements within normal limits will improve Outcome: Progressing   Problem: Clinical Measurements: Goal: Will remain free from infection Outcome: Progressing   

## 2017-11-11 NOTE — Anesthesia Preprocedure Evaluation (Addendum)
Anesthesia Evaluation  Patient identified by MRN, date of birth, ID band Patient awake    Reviewed: Allergy & Precautions, NPO status , Patient's Chart, lab work & pertinent test results  Airway Mallampati: III  TM Distance: >3 FB Neck ROM: Full    Dental  (+) Chipped,    Pulmonary sleep apnea and Continuous Positive Airway Pressure Ventilation , COPD,  COPD inhaler, former smoker,    Pulmonary exam normal breath sounds clear to auscultation       Cardiovascular + CAD, + Past MI and + Cardiac Stents  Normal cardiovascular exam Rhythm:Regular Rate:Normal  ECHO: Left ventricle: The cavity size was normal. Systolic function was normal. The estimated ejection fraction was in the range of 55% to 60%. Wall motion was normal; there were no regional wall motion abnormalities. Doppler parameters are consistent with abnormal left ventricular relaxation (grade 1 diastolic dysfunction). Left atrium: The atrium was mildly dilated.  Sees cardiologist   Neuro/Psych negative neurological ROS  negative psych ROS   GI/Hepatic negative GI ROS, Neg liver ROS,   Endo/Other  negative endocrine ROS  Renal/GU negative Renal ROS     Musculoskeletal negative musculoskeletal ROS (+)   Abdominal   Peds  Hematology HLD   Anesthesia Other Findings AAA  Infrarenal abdominal aortic aneurysm again measuring 5.2 cm   Reproductive/Obstetrics                            Anesthesia Physical Anesthesia Plan  ASA: IV  Anesthesia Plan: General   Post-op Pain Management:    Induction: Intravenous  PONV Risk Score and Plan: 2 and Treatment may vary due to age or medical condition, Ondansetron, Dexamethasone and Midazolam  Airway Management Planned: Oral ETT  Additional Equipment: Arterial line  Intra-op Plan:   Post-operative Plan: Extubation in OR  Informed Consent: I have reviewed the patients History and Physical,  chart, labs and discussed the procedure including the risks, benefits and alternatives for the proposed anesthesia with the patient or authorized representative who has indicated his/her understanding and acceptance.   Dental advisory given  Plan Discussed with: CRNA  Anesthesia Plan Comments:         Anesthesia Quick Evaluation

## 2017-11-11 NOTE — Op Note (Signed)
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  Percutaneous access and closure of bilateral common femoral arteries for delivery of endograft through a large sheath under ultrasound guidance (26834) 2.  Endovascular repair of infrarenal abdominal aneurysm by deployment of aorto-bi-iliac endograft including preprocedure sizing and radiological supervision and interpretation (19622)  Surgeon: Dr. Marty Heck, MD  Assistant: Roxy Horseman, PA  Indications: Patient is a 72 year old male who was recently seen in clinic with an incidentally discovered 5.2 cm aneurysm noted on a recent MRI of his prostate.  A further CTA was ordered and it showed a 5.2 cm infrarenal aneurysm with saccular morphology.  We initially made plans to fix this at a later time given his travel schedule.  Ultimately he presented earlier this week to the ED with new onset back pain.  A repeat CT showed no interval change but given the back pain that may complicate his future follow-up we elected to proceed with repair.  Findings: 5.2 cm infrarenal saccular aneurysm.  Successful deployment of an aorto-bi-iliac Gore Excluder endograft.  Main body left was 26 mm x 14 mm x 16 cm.  The right iliac limb was 16 mm x 11.5 cm.  No evidence of endoleak on completion aortogram.  We did cover two small left accessory renal arteries with preservation of the main left renal artery.  Complications: None  Details: The patient was taken to the operating room after informed consent was obtained.  He was placed on operating table in supine position.  His abdomen as well as bilateral groins were prepped and draped in usual sterile fashion after general anesthesia was induced.  Timeout was performed to identify patient, procedure, and site.  We initially accessed his bilateral common femoral arteries under ultrasound guidance with a  micro access needle.  We then threaded a micro access wire over which we placed a micro access sheath.  We then exchanged for a Bentson wire in each common femoral artery and deployed two Perclose devices in each common femoral arterr after dilating the artery with an 8 [place] dilator.  We then placed 8 French sheaths in the bilateral common femoral arteries.  We then threaded the Bentson wire in each groin into the thoracic aorta and used a KMP catheter to exchange for stiff Amplatz wires.  At this point in time the patient was given 8000 units of IV heparin and ACTs were monitored throughout the case to maintain them greater than 250.  We then exchanged for a larger sheaths and we used a 16 [place] Gore dry seal sheath in the left groin and a 12 [place] Gore dry seal sheath in the right groin.  We then placed our main body device up the left side to the level of L2 where the renal arteries were noted on CT scan.  This was a 26 mm x 14 mm x 16 cm main body left device.  We then placed the pigtail up the right side and used AP with 5 degrees of cranial to shoot a limited aortogram to evaluate the renal arteries.  Once we had appropriate imaging we deployed the proximal main body down to the gate below the right renal artery which was the lowest.  We did cover two small accessory left renal arteries and preserved the main left renal artery.  If we tried to save these accessories we felt it would compromise the graft by landing it  in a angulated segment of aorta.  We then placed a stiff wire back up the pigtail catheter in the right groin and used a buddy wire with a KMP and soft angled glide to cannulate the gate.  We elected to cross the limbs of the graft.  We then shot a retrograde iliac shot on the right after pulling her sheath back to identify the hypogastric artery which was preserved.  We then deployed a 16 mm x 11.5 cm iliac limb on the right.  Finally we pulled back the sheath in the left groin and again  through retrograde injection identified the left hypogastric artery which was preserved.  We finished deploying the main body device down the left side that was just short of the left hypogastric artery.  We then used a aortic balloon and treated the proximal as well as the distal seal zone and all overlapping segments of the graft.  We then placed the pigtail catheter back on the right side and shot a final aortogram.  This shows preservation of the main right and left renal arteries.  The infrarenal aneurysm is excluded.  There was some area on the right lateral wall of the graft that we felt was consistent with bowel gas given that it was apparent prior to injection and did not move after prolonged run time.  We did not think there was any overt endoleak.  We were further reassured given that our proximal seal zone was over 4 cm and this should not represent a type I a leak.  At this point in time we exchanged back for a Bentson wires in both groins and then the Perclose devices were deployed at 10:00 and 2:00 in each common femoral artery leaving our wires in place initially until we had adequate hemostasis.  Then the wires were individually removed and final Perclose secured.  We checked the feet and the patient had palpable pedal pulses with great signals.  The patient was given 40 mg of protamine for reversal.  The groin incisions were then closed individually with a subcuticular 4 Monocryl and Dermabond was applied.  He was extubated and taken to PACU in stable condition.  Anesthesia: General  Complications: None  Marty Heck, MD [facility] Office: [phone] Pager: [place]

## 2017-11-11 NOTE — Transfer of Care (Signed)
Immediate Anesthesia Transfer of Care Note  Patient: Juan Lynn  Procedure(s) Performed: ABDOMINAL AORTIC ENDOVASCULAR STENT GRAFT (N/A )  Patient Location: PACU  Anesthesia Type:General  Level of Consciousness: awake, alert  and oriented  Airway & Oxygen Therapy: Patient Spontanous Breathing and Patient connected to nasal cannula oxygen  Post-op Assessment: Report given to RN and Post -op Vital signs reviewed and stable  Post vital signs: Reviewed and stable  Last Vitals:  Vitals Value Taken Time  BP 168/96 11/11/2017  5:35 PM  Temp    Pulse 82 11/11/2017  5:37 PM  Resp 12 11/11/2017  5:37 PM  SpO2 95 % 11/11/2017  5:37 PM  Vitals shown include unvalidated device data.  Last Pain:  Vitals:   11/11/17 1115  TempSrc: Oral  PainSc:          Complications: No apparent anesthesia complications

## 2017-11-11 NOTE — Progress Notes (Signed)
Pt arrived from PACU. Pt c/a/ox4. Pt has no complaints. A-line connected and zeroed. Vitals stable. CCMD notified Telebox 5 applied. Will continue to monitor. Jerald Kief, RN

## 2017-11-11 NOTE — Progress Notes (Signed)
Patient refused CPAP at this time.

## 2017-11-12 ENCOUNTER — Encounter (HOSPITAL_COMMUNITY): Payer: Self-pay | Admitting: Vascular Surgery

## 2017-11-12 LAB — CBC
HCT: 40.4 % (ref 39.0–52.0)
Hemoglobin: 13.5 g/dL (ref 13.0–17.0)
MCH: 33.8 pg (ref 26.0–34.0)
MCHC: 33.4 g/dL (ref 30.0–36.0)
MCV: 101 fL — AB (ref 78.0–100.0)
PLATELETS: 109 10*3/uL — AB (ref 150–400)
RBC: 4 MIL/uL — ABNORMAL LOW (ref 4.22–5.81)
RDW: 12.3 % (ref 11.5–15.5)
WBC: 8.6 10*3/uL (ref 4.0–10.5)

## 2017-11-12 LAB — BASIC METABOLIC PANEL
Anion gap: 8 (ref 5–15)
BUN: 12 mg/dL (ref 8–23)
CO2: 22 mmol/L (ref 22–32)
Calcium: 8.3 mg/dL — ABNORMAL LOW (ref 8.9–10.3)
Chloride: 109 mmol/L (ref 98–111)
Creatinine, Ser: 1.12 mg/dL (ref 0.61–1.24)
GFR calc Af Amer: >60 mL/min (ref >60)
GLUCOSE: 90 mg/dL (ref 70–99)
Potassium: 3.6 mmol/L (ref 3.5–5.1)
Sodium: 139 mmol/L (ref 135–145)

## 2017-11-12 MED ORDER — OXYCODONE HCL 5 MG PO TABS: 5.0000 mg | ORAL_TABLET | Freq: Four times a day (QID) | ORAL | 0 refills | Status: DC | PRN

## 2017-11-12 NOTE — Anesthesia Postprocedure Evaluation (Signed)
Anesthesia Post Note  Patient: Juan Lynn  Procedure(s) Performed: ABDOMINAL AORTIC ENDOVASCULAR STENT GRAFT (N/A )     Patient location during evaluation: PACU Anesthesia Type: General Level of consciousness: awake and alert Pain management: pain level controlled Vital Signs Assessment: post-procedure vital signs reviewed and stable Respiratory status: spontaneous breathing, nonlabored ventilation, respiratory function stable and patient connected to nasal cannula oxygen Cardiovascular status: blood pressure returned to baseline and stable Postop Assessment: no apparent nausea or vomiting Anesthetic complications: no    Last Vitals:  Vitals:   11/11/17 2100 11/11/17 2342  BP:  133/81  Pulse: 69   Resp: 11   Temp:  37.1 C  SpO2: 99%     Last Pain:  Vitals:   11/11/17 2342  TempSrc: Oral  PainSc:                  [name] [name]

## 2017-11-12 NOTE — Discharge Instructions (Signed)
   [facility]   Discharge Instructions  Endovascular Aortic Aneurysm Repair  Please refer to the following instructions for your post-procedure care. Your surgeon or Physician Assistant will discuss any changes with you.  Activity  You are encouraged to walk as much as you can. You can slowly return to normal activities but must avoid strenuous activity and heavy lifting until your doctor tells you it's OK. Avoid activities such as vacuuming or swinging a gold club. It is normal to feel tired for several weeks after your surgery. Do not drive until your doctor gives the OK and you are no longer taking prescription pain medications. It is also normal to have difficulty with sleep habits, eating, and bowel movements after surgery. These will go away with time.  Bathing/Showering  You may shower after you go home. If you have an incision, do not soak in a bathtub, hot tub, or swim until the incision heals completely.  Incision Care  Shower every day. Clean your incision with mild soap and water. Pat the area dry with a clean towel. You do not need a bandage unless otherwise instructed. Do not apply any ointments or creams to your incision. If you clothing is irritating, you may cover your incision with a dry gauze pad.  Diet  Resume your normal diet. There are no special food restrictions following this procedure. A low fat/low cholesterol diet is recommended for all patients with vascular disease. In order to heal from your surgery, it is CRITICAL to get adequate nutrition. Your body requires vitamins, minerals, and protein. Vegetables are the best source of vitamins and minerals. Vegetables also provide the perfect balance of protein. Processed food has little nutritional value, so try to avoid this.  Medications  Resume taking all of your medications unless your doctor or nurse practitioner tells you not to. If your incision is causing pain, you may take  over-the-counter pain relievers such as acetaminophen (Tylenol). If you were prescribed a stronger pain medication, please be aware these medications can cause nausea and constipation. Prevent nausea by taking the medication with a snack or meal. Avoid constipation by drinking plenty of fluids and eating foods with a high amount of fiber, such as fruits, vegetables, and grains. Do not take Tylenol if you are taking prescription pain medications.   Follow up  Our office will schedule a follow-up appointment with a C.T. scan 3-4 weeks after your surgery.  Please call us immediately for any of the following conditions  Severe or worsening pain in your legs or feet or in your abdomen back or chest. Increased pain, redness, drainage (pus) from your incision sit. Increased abdominal pain, bloating, nausea, vomiting or persistent diarrhea. Fever of 101 degrees or higher. Swelling in your leg (s),  Reduce your risk of vascular disease  Stop smoking. If you would like help call [facility] at 1-800-QUIT-NOW (1-800-784-8669) or [place] at 336-586-4000. Manage your cholesterol Maintain a desired weight Control your diabetes Keep your blood pressure down  If you have questions, please call the office at [phone].   

## 2017-11-12 NOTE — Progress Notes (Addendum)
[facility]  Subjective  - Doing well over all.   Objective 133/81 69 98.8 F (37.1 C) (Oral) 11 99%  Intake/Output Summary (Last 24 hours) at 11/12/2017 0720 Last data filed at 11/12/2017 6015 Gross per 24 hour  Intake 2250.92 ml  Output 1785 ml  Net 465.92 ml    Palpable PT  Left, DP right Groins soft without hematoma Heart RRR Lungs non labored breathing   Assessment/Planning: POD # 1 EVAR Continue Plavix and Asprin daily He has Voided, ambulated, and tolerating PO's Plan to discharge home today with a f/u in 1 month and repeat CTA Abd/pelvis with Dr. Carlis Abbott.      Roxy Horseman 11/12/2017 7:20 AM --  Laboratory Lab Results: Recent Labs    11/11/17 1800 11/12/17 0500  WBC 7.3 8.6  HGB 14.7 13.5  HCT 43.2 40.4  PLT 135* PENDING   BMET Recent Labs    11/11/17 1800 11/12/17 0500  NA 140 139  K 3.7 3.6  CL 110 109  CO2 25 22  GLUCOSE 102* 90  BUN 9 12  CREATININE 0.95 1.12  CALCIUM 8.3* 8.3*    COAG Lab Results  Component Value Date   INR 1.13 11/11/2017   INR 1.08 06/26/2012   INR 7.07 (HH) 06/25/2012   No results found for: PTT   I have seen and evaluated the patient. I agree with the PA note as documented above. Groins look good.  Palpable pedal pulses.  OK for discharge.  F/U in one month with CTA.  Marty Heck, MD [facility] Office: [phone] Pager: [phone]

## 2017-11-13 ENCOUNTER — Other Ambulatory Visit: Payer: Self-pay

## 2017-11-13 DIAGNOSIS — I714 Abdominal aortic aneurysm, without rupture, unspecified: Secondary | ICD-10-CM

## 2017-11-16 ENCOUNTER — Encounter: Payer: Self-pay | Admitting: *Deleted

## 2017-11-16 ENCOUNTER — Telehealth: Payer: Self-pay | Admitting: Vascular Surgery

## 2017-11-16 NOTE — Discharge Summary (Signed)
[facility] Discharge Summary   Patient ID:  Juan Lynn MRN: [id number] DOB/AGE: 1945-05-22 72 y.o.  Admit date: 11/09/2017 Discharge date: 11/12/2017 Date of Surgery: 11/11/2017 Surgeon: Surgeon(s): Marty Heck, MD  Admission Diagnosis: sent by dr abdominal pain  Discharge Diagnoses:  sent by dr abdominal pain  Secondary Diagnoses: Past Medical History:  Diagnosis Date  . BPH (benign prostatic hyperplasia)   . CAD (coronary artery disease) 06/25/2012   a. Inf STEMI s/p cath- DES-mid RCA b. Echo- EF 55%, basal inferior HK  . COPD (chronic obstructive pulmonary disease) ([place])   . OSA (obstructive sleep apnea)   . Tobacco abuse     Procedure(s): ABDOMINAL AORTIC ENDOVASCULAR STENT GRAFT  Discharged Condition: good  HPI: This is a 72 y.o. 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  He was recently seen in clinic several weeks ago and at the time was having no abdominal pain or back pain.  He states since last Wednesday he has had some dull lower back pain that he initially rated as 6 out of 10.  He decided to come in for evaluation given the length that the pain has persisted through the weekend.  He has had back pain in the past that is of the same character.   He was found to have AAA 5.2 cm he was scheduled for EVAR.     Hospital Course:  Juan Lynn is a 72 y.o. male is S/P  Procedure(s): ABDOMINAL AORTIC ENDOVASCULAR STENT GRAFT    Palpable PT  Left, DP right Groins soft without hematoma Heart RRR Lungs non labored breathing   Assessment/Planning: POD # 1 EVAR Continue Plavix and Asprin daily He has Voided, ambulated, and tolerating PO's Plan to discharge home today with a f/u in 1 month and repeat CTA Abd/pelvis with Dr. Carlis Abbott.     Significant Diagnostic Studies: CBC Lab  Results  Component Value Date   WBC 8.6 11/12/2017   HGB 13.5 11/12/2017   HCT 40.4 11/12/2017   MCV 101.0 (H) 11/12/2017   PLT 109 (L) 11/12/2017    BMET    Component Value Date/Time   NA 139 11/12/2017 0500   NA 136 11/10/2016 0910   K 3.6 11/12/2017 0500   CL 109 11/12/2017 0500   CO2 22 11/12/2017 0500   GLUCOSE 90 11/12/2017 0500   BUN 12 11/12/2017 0500   BUN 24 11/10/2016 0910   CREATININE 1.12 11/12/2017 0500   CALCIUM 8.3 (L) 11/12/2017 0500   GFRNONAA >60 11/12/2017 0500   GFRAA >60 11/12/2017 0500   COAG Lab Results  Component Value Date   INR 1.13 11/11/2017   INR 1.08 06/26/2012   INR 7.07 (HH) 06/25/2012     Disposition:  Discharge to :Home Discharge Instructions    Call MD for:  redness, tenderness, or signs of infection (pain, swelling, bleeding, redness, odor or green/yellow discharge around incision site)   Complete by:  As directed    Call MD for:  severe or increased pain, loss or decreased feeling  in affected limb(s)   Complete by:  As directed    Call MD for:  temperature >100.5   Complete by:  As directed    Resume previous diet   Complete by:  As directed      Allergies as of 11/12/2017   No Known Allergies  Medication List    TAKE these medications   albuterol 108 (90 Base) MCG/ACT inhaler Commonly known as:  PROVENTIL HFA;VENTOLIN HFA Inhale 2 puffs into the lungs every 6 (six) hours as needed for wheezing or shortness of breath.   aspirin 81 MG chewable tablet Chew 1 tablet (81 mg total) by mouth daily. What changed:  when to take this   atorvastatin 80 MG tablet Commonly known as:  LIPITOR Take 1 tablet (80 mg total) by mouth at bedtime.   clopidogrel 75 MG tablet Commonly known as:  PLAVIX Take 1 tablet (75 mg total) by mouth daily. What changed:  when to take this   finasteride 5 MG tablet Commonly known as:  PROSCAR Take 5 mg by mouth daily.   fluticasone-salmeterol 115-21 MCG/ACT inhaler Commonly known as:   ADVAIR HFA Inhale 2 puffs into the lungs 2 (two) times daily.   metoprolol tartrate 25 MG tablet Commonly known as:  LOPRESSOR Take 1 tablet (25 mg total) by mouth 2 (two) times daily. What changed:    how much to take  when to take this   multivitamin with minerals Tabs tablet Take 1 tablet by mouth at bedtime. Centrum silver men   nitroGLYCERIN 0.4 MG SL tablet Commonly known as:  NITROSTAT Place 1 tablet (0.4 mg total) under the tongue every 5 (five) minutes as needed for chest pain.   oxyCODONE 5 MG immediate release tablet Commonly known as:  Oxy IR/ROXICODONE Take 1 tablet (5 mg total) by mouth every 6 (six) hours as needed for moderate pain.   tamsulosin 0.4 MG Caps capsule Commonly known as:  FLOMAX Take 0.8 mg by mouth at bedtime.      Verbal and written Discharge instructions given to the patient. Wound care per Discharge AVS Follow-up Information    Marty Heck, MD Follow up in 4 week(s).   Specialty:  Vascular Surgery Why:  office will call Contact information: [place] [address] [phone]           Signed: Roxy Horseman 11/16/2017, 11:57 AM - For VQI Registry use --- Instructions: Press F2 to tab through selections.  Delete question if not applicable.   Post-op:  Time to Extubation: [x ] In OR, [ ]  < 12 hrs, [ ]  12-24 hrs, [ ]  >=24 hrs Vasopressors Req. Post-op: No MI: [x ] No, [ ]  Troponin only, [ ]  EKG or Clinical New Arrhythmia: No CHF: No ICU Stay: 0 days Transfusion: No  If yes, 0 units given  Complications: Resp failure: [x ] none, [ ]  Pneumonia, [ ]  Ventilator Chg in renal function: [x ] none, [ ]  Inc. Cr > 0.5, [ ]  Temp. Dialysis, [ ]  Permanent dialysis Leg ischemia: x[ ]  No, [ ]  Yes, no Surgery needed, [ ]  Yes, Surgery needed, [ ]  Amputation Bowel ischemia: [x ] No, [ ]  Medical Rx, [ ]  Surgical Rx Wound complication: [x ] No, [ ]  Superficial separation/infection, [ ]  Return to OR Return to OR:  No  Return to OR for bleeding: No Stroke: [x ] None, [ ]  Minor, [ ]  Major  Discharge medications: Statin use:  Yes ASA use:  Yes Plavix use:  Yes Beta blocker use:  Yes

## 2017-11-16 NOTE — Telephone Encounter (Signed)
sch appt spk to pt sch CTA 11/20/17 230pm 12/08/17 830am f/u MD

## 2017-11-20 ENCOUNTER — Other Ambulatory Visit: Payer: Self-pay

## 2017-11-20 ENCOUNTER — Ambulatory Visit
Admission: RE | Admit: 2017-11-20 | Discharge: 2017-11-20 | Disposition: A | Discharge status: Home / Self Care | Payer: Medicare Other | Source: Ambulatory Visit | Attending: Vascular Surgery | Admitting: Vascular Surgery

## 2017-11-20 DIAGNOSIS — I714 Abdominal aortic aneurysm, without rupture, unspecified: Secondary | ICD-10-CM

## 2017-12-03 ENCOUNTER — Ambulatory Visit
Admission: RE | Admit: 2017-12-03 | Discharge: 2017-12-03 | Disposition: A | Discharge status: Home / Self Care | Payer: Medicare Other | Source: Ambulatory Visit | Attending: Vascular Surgery | Admitting: Vascular Surgery

## 2017-12-03 DIAGNOSIS — I714 Abdominal aortic aneurysm, without rupture, unspecified: Secondary | ICD-10-CM

## 2017-12-03 MED ORDER — IOPAMIDOL (ISOVUE-370) INJECTION 76%
75.0000 mL | Freq: Once | INTRAVENOUS | Status: AC | PRN
  Administered 2017-12-03: 75 mL via INTRAVENOUS

## 2017-12-04 DIAGNOSIS — Z1159 Encounter for screening for other viral diseases: Secondary | ICD-10-CM | POA: Diagnosis not present

## 2017-12-04 DIAGNOSIS — Z86010 Personal history of colonic polyps: Secondary | ICD-10-CM | POA: Diagnosis not present

## 2017-12-04 DIAGNOSIS — C61 Malignant neoplasm of prostate: Secondary | ICD-10-CM | POA: Diagnosis not present

## 2017-12-04 DIAGNOSIS — N400 Enlarged prostate without lower urinary tract symptoms: Secondary | ICD-10-CM | POA: Diagnosis not present

## 2017-12-04 DIAGNOSIS — E7800 Pure hypercholesterolemia, unspecified: Secondary | ICD-10-CM | POA: Diagnosis not present

## 2017-12-04 DIAGNOSIS — Z955 Presence of coronary angioplasty implant and graft: Secondary | ICD-10-CM | POA: Diagnosis not present

## 2017-12-04 DIAGNOSIS — Z0000 Encounter for general adult medical examination without abnormal findings: Secondary | ICD-10-CM | POA: Diagnosis not present

## 2017-12-04 DIAGNOSIS — Z8679 Personal history of other diseases of the circulatory system: Secondary | ICD-10-CM | POA: Diagnosis not present

## 2017-12-04 DIAGNOSIS — J449 Chronic obstructive pulmonary disease, unspecified: Secondary | ICD-10-CM | POA: Diagnosis not present

## 2017-12-04 DIAGNOSIS — D7589 Other specified diseases of blood and blood-forming organs: Secondary | ICD-10-CM | POA: Diagnosis not present

## 2017-12-04 DIAGNOSIS — I252 Old myocardial infarction: Secondary | ICD-10-CM | POA: Diagnosis not present

## 2017-12-08 ENCOUNTER — Encounter: Payer: Self-pay | Admitting: Vascular Surgery

## 2017-12-08 ENCOUNTER — Ambulatory Visit (INDEPENDENT_AMBULATORY_CARE_PROVIDER_SITE_OTHER): Payer: Self-pay | Admitting: Vascular Surgery

## 2017-12-08 VITALS — BP 150/88 | HR 63 | Temp 97.0°F | Resp 16 | Ht 67.0 in | Wt 184.0 lb

## 2017-12-08 DIAGNOSIS — I714 Abdominal aortic aneurysm, without rupture, unspecified: Secondary | ICD-10-CM

## 2017-12-08 NOTE — Progress Notes (Signed)
Vitals:   12/08/17 0813  BP: (!) 146/84  Pulse: 62  Resp: 16  Temp: (!) 97 F (36.1 C)  TempSrc: Oral  SpO2: 97%  Weight: 184 lb (83.5 kg)  Height: 5\' 7"  (1.702 m)

## 2017-12-08 NOTE — Progress Notes (Signed)
Patient name: Juan Lynn MRN: [id number] DOB: 07-12-1945 Sex: male  REASON FOR VISIT: 1 month follow-up status post EVAR  HPI: Juan Lynn is a 72 y.o. 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  He reports no issues over the last month.  His groins have healed without issue.  He's had no more symptoms of back or abdominal pain in the interim.  He did get a CT scan last week.  Past Medical History:  Diagnosis Date  . BPH (benign prostatic hyperplasia)   . CAD (coronary artery disease) 06/25/2012   a. Inf STEMI s/p cath- DES-mid RCA b. Echo- EF 55%, basal inferior HK  . COPD (chronic obstructive pulmonary disease) ([place])   . OSA (obstructive sleep apnea)   . Tobacco abuse     Past Surgical History:  Procedure Laterality Date  . ABDOMINAL AORTIC ENDOVASCULAR STENT GRAFT N/A 11/11/2017   Procedure: ABDOMINAL AORTIC ENDOVASCULAR STENT GRAFT;  Surgeon: Marty Heck, MD;  Location: [place];  Service: Vascular;  Laterality: N/A;  . CATARACT EXTRACTION    . CORONARY ANGIOPLASTY WITH STENT PLACEMENT  06/25/2012   100% mid RCA s/p DES, 30% prox AVG Cx, mild luminal irregularities elsewhere; EF 55%  . HERNIA REPAIR    . LEFT HEART CATHETERIZATION WITH CORONARY ANGIOGRAM N/A 06/25/2012   Procedure: LEFT HEART CATHETERIZATION WITH CORONARY ANGIOGRAM;  Surgeon: Burnell Blanks, MD;  Location: [facility] C[facility] LAB;  Service: Cardiovascular;  Laterality: N/A;  . PERCUTANEOUS CORONARY STENT INTERVENTION (PCI-S)  06/25/2012   Procedure: PERCUTANEOUS CORONARY STENT INTERVENTION (PCI-S);  Surgeon: Burnell Blanks, MD;  Location: [facility] CATH LAB;  Service: Cardiovascular;;    Family History  Problem Relation Age of Onset  . Heart attack Father   . CAD Father   . Pancreatic cancer Father   . Emphysema Maternal Grandfather        smoked heavily   . Stomach cancer Paternal Grandmother   . Prostate cancer  Paternal Uncle     SOCIAL HISTORY: Social History   Tobacco Use  . Smoking status: Former Smoker    Packs/day: 1.00    Years: 50.00    Pack years: 50.00    Types: Cigarettes    Last attempt to quit: 06/01/2012    Years since quitting: 5.5  . Smokeless tobacco: Never Used  Substance Use Topics  . Alcohol use: Yes    Comment: 3 beers daily    No Known Allergies  Current Outpatient Medications  Medication Sig Dispense Refill  . albuterol (PROVENTIL HFA;VENTOLIN HFA) 108 (90 BASE) MCG/ACT inhaler Inhale 2 puffs into the lungs every 6 (six) hours as needed for wheezing or shortness of breath. 3 Inhaler 0  . aspirin 81 MG chewable tablet Chew 1 tablet (81 mg total) by mouth daily. (Patient taking differently: Chew 81 mg by mouth at bedtime. )    . atorvastatin (LIPITOR) 80 MG tablet Take 1 tablet (80 mg total) by mouth at bedtime. 90 tablet 3  . clopidogrel (PLAVIX) 75 MG tablet Take 1 tablet (75 mg total) by mouth daily. (Patient taking differently: Take 75 mg by mouth at bedtime. ) 90 tablet 3  . finasteride (PROSCAR) 5 MG tablet Take 5 mg by mouth daily.    . fluticasone-salmeterol (ADVAIR HFA) 115-21 MCG/ACT inhaler Inhale 2 puffs into the lungs 2 (two) times daily. 3 Inhaler 0  . metoprolol tartrate (LOPRESSOR) 25 MG tablet Take 1 tablet (25 mg  total) by mouth 2 (two) times daily. (Patient taking differently: Take 50 mg by mouth at bedtime. ) 180 tablet 3  . Multiple Vitamin (MULTIVITAMIN WITH MINERALS) TABS tablet Take 1 tablet by mouth at bedtime. Centrum silver men    . nitroGLYCERIN (NITROSTAT) 0.4 MG SL tablet Place 1 tablet (0.4 mg total) under the tongue every 5 (five) minutes as needed for chest pain. 75 tablet 2  . tamsulosin (FLOMAX) 0.4 MG CAPS Take 0.8 mg by mouth at bedtime.     Marland Kitchen oxyCODONE (OXY IR/ROXICODONE) 5 MG immediate release tablet Take 1 tablet (5 mg total) by mouth every 6 (six) hours as needed for moderate pain. (Patient not taking: Reported on 12/08/2017) 10  tablet 0   No current facility-administered medications for this visit.     REVIEW OF SYSTEMS:  [X]  denotes positive finding, [ ]  denotes negative finding Cardiac  Comments:  Chest pain or chest pressure:    Shortness of breath upon exertion:    Short of breath when lying flat:    Irregular heart rhythm:        Vascular    Pain in calf, thigh, or hip brought on by ambulation:    Pain in feet at night that wakes you up from your sleep:     Blood clot in your veins:    Leg swelling:         Pulmonary    Oxygen at home:    Productive cough:     Wheezing:         Neurologic    Sudden weakness in arms or legs:     Sudden numbness in arms or legs:     Sudden onset of difficulty speaking or slurred speech:    Temporary loss of vision in one eye:     Problems with dizziness:         Gastrointestinal    Blood in stool:     Vomited blood:         Genitourinary    Burning when urinating:     Blood in urine:        Psychiatric    Major depression:         Hematologic    Bleeding problems:    Problems with blood clotting too easily:        Skin    Rashes or ulcers:        Constitutional    Fever or chills:      PHYSICAL EXAM: Vitals:   12/08/17 0813 12/08/17 0817  BP: (!) 146/84 (!) 150/88  Pulse: 62 63  Resp: 16   Temp: (!) 97 F (36.1 C)   TempSrc: Oral   SpO2: 97%   Weight: 83.5 kg   Height: 5\' 7"  (1.702 m)     GENERAL: The patient is a well-nourished male, in no acute distress. The vital signs are documented above. CARDIAC: There is a regular rate and rhythm.  VASCULAR:  2+ femoral pulse palpable bilateral grins 2+ DP palpable BLE Feet warm PULMONARY: There is good air exchange bilaterally without wheezing or rales. ABDOMEN: Soft and non-tender with normal pitched bowel sounds.  No pain with palpation of aneurysm.   DATA:   CTA 12/03/17: I have independently reviewed his CTA and his aortobiiliac endograft appears to be in appropriate position.   There appears to be good seal proximally and distally.  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  The aneurysm sac itself appears stable at 5.2 cm.  Assessment/Plan:  Overall Juan Lynn appears to be doing very well status post aortobiiliac endoprosthesis (EVAR) for a 5.2 cm saccular infrarenal abdominal aortic aneurysm.  He does have a type II endoleak on his CTA but the aneurysm itself appears stable.  We discussed the nature of type II endoleak's today.  As long as the aneurysm itself remains stable we will continue surveillance.  I will plan to see him back in 3 months with a EVAR duplex at that time to monitor for any growth.  He can resume his normal activities at this time.  Call with questions or concerns.   Marty Heck, MD [facility] Office: [phone] Pager: [place]

## 2017-12-24 ENCOUNTER — Telehealth: Payer: Self-pay | Admitting: Physician Assistant

## 2017-12-24 ENCOUNTER — Telehealth: Payer: Self-pay | Admitting: Cardiovascular Disease

## 2017-12-24 MED ORDER — ATORVASTATIN CALCIUM 80 MG PO TABS: 80.0000 mg | ORAL_TABLET | Freq: Every day | ORAL | 3 refills | Status: DC

## 2017-12-24 MED ORDER — CLOPIDOGREL BISULFATE 75 MG PO TABS: 75.0000 mg | ORAL_TABLET | Freq: Every day | ORAL | 3 refills | Status: DC

## 2017-12-24 MED ORDER — METOPROLOL TARTRATE 25 MG PO TABS: 25.0000 mg | ORAL_TABLET | Freq: Two times a day (BID) | ORAL | 3 refills | Status: DC

## 2017-12-24 MED ORDER — METOPROLOL TARTRATE 25 MG PO TABS: 50.0000 mg | ORAL_TABLET | Freq: Every day | ORAL | 3 refills | Status: DC

## 2017-12-24 NOTE — Telephone Encounter (Signed)
Advised per review of OV notes from [place] and Kathlen Mody that both advise Pt continue Plavix at this time.    Pt indicates understanding.  Pt requests cardiac meds be refilled to [facility].  Meds sent as requested.

## 2017-12-24 NOTE — Telephone Encounter (Signed)
New Message    Pt c/o medication issue:  1. Name of Medication: clopidogrel (PLAVIX) 75 MG tablet  2. How are you currently taking this medication (dosage and times per day)? Take 1 tablet (75 mg total) by mouth daily. Patient taking differently: Take 75 mg by mouth at bedtime.   3. Are you having a reaction (difficulty breathing--STAT)?   4. What is your medication issue? Patient wants to know should he still be taking Plavix 5 years after his heart attack. Please call to discuss.

## 2017-12-24 NOTE — Telephone Encounter (Signed)
Pt at some point had stated he took his metoprolol 25 mg tartrate 2 tablets at bedtime.  At last hospital visit Pt was discharged on metoprolol tartrate 25 mg bid.   Will correct medication order.

## 2017-12-24 NOTE — Telephone Encounter (Signed)
I just renewed a Rx for Metoprolol tartrate.  He should be taking it 25 mg bid.   However, the Rx is for 2 tabs (total of 50 mg) at bedtime. Please confirm with patient how he is taking it and why he is taking 50 mg QHS instead of 25 mg bid. Richardson Dopp, PA-C    12/24/2017 11:33 AM

## 2017-12-25 ENCOUNTER — Other Ambulatory Visit: Payer: Self-pay

## 2017-12-25 DIAGNOSIS — I714 Abdominal aortic aneurysm, without rupture, unspecified: Secondary | ICD-10-CM

## 2017-12-29 ENCOUNTER — Ambulatory Visit (INDEPENDENT_AMBULATORY_CARE_PROVIDER_SITE_OTHER): Payer: Medicare Other | Admitting: Internal Medicine

## 2017-12-29 ENCOUNTER — Encounter: Payer: Self-pay | Admitting: Internal Medicine

## 2017-12-29 ENCOUNTER — Ambulatory Visit (INDEPENDENT_AMBULATORY_CARE_PROVIDER_SITE_OTHER)
Admission: RE | Admit: 2017-12-29 | Discharge: 2017-12-29 | Disposition: A | Discharge status: Home / Self Care | Payer: Medicare Other | Source: Ambulatory Visit | Attending: Internal Medicine | Admitting: Internal Medicine

## 2017-12-29 DIAGNOSIS — I2510 Atherosclerotic heart disease of native coronary artery without angina pectoris: Secondary | ICD-10-CM

## 2017-12-29 DIAGNOSIS — R062 Wheezing: Secondary | ICD-10-CM | POA: Diagnosis not present

## 2017-12-29 DIAGNOSIS — J449 Chronic obstructive pulmonary disease, unspecified: Secondary | ICD-10-CM

## 2017-12-29 MED ORDER — BUDESONIDE-FORMOTEROL FUMARATE 80-4.5 MCG/ACT IN AERO: 2.0000 | INHALATION_SPRAY | Freq: Two times a day (BID) | RESPIRATORY_TRACT | 0 refills | Status: DC

## 2017-12-29 NOTE — Patient Instructions (Signed)
You continue to have very mild copd that is barely detectable  You may be prone to mild wheezing due to this but should be able to eliminate this using symbicort 80 41ffs in am with an extra 2 puffs 12 hours later if your are having any symptoms at all    Work on inhaler technique:  relax and gently blow all the way out then take a nice smooth deep breath back in, triggering the inhaler at same time you start breathing in.  Hold for up to 5 seconds if you can. Blow out thru nose. Rinse and gargle with water when done     Please remember to go to the  x-ray department downstairs in the basement  for your tests - we will call you with the results when they are available.   Pulmonary follow up is as needed

## 2017-12-29 NOTE — Progress Notes (Signed)
Juan Lynn, male    DOB: 1946/01/03,   MRN: [id number]    Brief patient profile:  66 ywom quit smoking 2014 p MI after dx of copd 2004 in [place] on prn saba but didn't need very  often referred 05/06/2013 to pulmonary clinic by Dr Marlane Mingle with a classic Circle with reversibility.  recs as of  05/2014  You only have a GOLD I severity  Start advair 115 /21 Take 2 puffs first thing in am and then another 2 puffs about 12 hours later.  Only use your albuterol as a rescue medication     History of Present Illness  12/29/2017  Pulmonary/ "new pt eval" as last seen 05/2014 but daughter concerned about wheeze  X 3y  Chief Complaint  Patient presents with  . Pulmonary Consult    Last seen in 2016. He states his breathing is unchanged since then.  He gets winded walking a long distance such as accross a large parking lot. He rarely uses his albuterol inhaler or his advair.   Dyspnea:  MMRC1 = can walk nl pace, flat grade, can't hurry or go uphills or steps s sob   Cough: none Sleep: on bipap per [place]borne sleeping flat  SABA use: not much   No obvious day to day or daytime variability or assoc excess/ purulent sputum or mucus plugs or hemoptysis or cp or chest tightness,  or overt sinus or hb symptoms.   Sleeping on bipap  without nocturnal  or early am exacerbation  of respiratory  c/o's or need for noct saba. Also denies any obvious fluctuation of symptoms with weather or environmental changes or other aggravating or alleviating factors except as outlined above   No unusual exposure hx or h/o childhood pna/ asthma or knowledge of premature birth.  Current Allergies, Complete Past Medical History, Past Surgical History, Family History, and Social History were reviewed in [employer] record.  ROS  The following are not active complaints unless bolded Hoarseness, sore throat, dysphagia, dental problems, itching, sneezing,  nasal congestion or discharge of excess  mucus or purulent secretions, ear ache,   fever, chills, sweats, unintended wt loss or wt gain, classically pleuritic or exertional cp,  orthopnea pnd or arm/hand swelling  or leg swelling, presyncope, palpitations, abdominal pain, anorexia, nausea, vomiting, diarrhea  or change in bowel habits or change in bladder habits, change in stools or change in urine, dysuria, hematuria,  rash, arthralgias, visual complaints, headache, numbness, weakness or ataxia or problems with walking or coordination,  change in mood or  memory.        Current Meds  Medication Sig  . albuterol (PROVENTIL HFA;VENTOLIN HFA) 108 (90 BASE) MCG/ACT inhaler Inhale 2 puffs into the lungs every 6 (six) hours as needed for wheezing or shortness of breath.  Marland Kitchen aspirin 81 MG chewable tablet Chew 1 tablet (81 mg total) by mouth daily. (Patient taking differently: Chew 81 mg by mouth at bedtime. )  . atorvastatin (LIPITOR) 80 MG tablet Take 1 tablet (80 mg total) by mouth at bedtime.  . clopidogrel (PLAVIX) 75 MG tablet Take 1 tablet (75 mg total) by mouth at bedtime.  . finasteride (PROSCAR) 5 MG tablet Take 5 mg by mouth daily.  . metoprolol tartrate (LOPRESSOR) 25 MG tablet Take 1 tablet (25 mg total) by mouth 2 (two) times daily.  . Multiple Vitamin (MULTIVITAMIN WITH MINERALS) TABS tablet Take 1 tablet by mouth at bedtime. Centrum silver men  . nitroGLYCERIN (  NITROSTAT) 0.4 MG SL tablet Place 1 tablet (0.4 mg total) under the tongue every 5 (five) minutes as needed for chest pain.  . tamsulosin (FLOMAX) 0.4 MG CAPS Take 0.8 mg by mouth at bedtime.       Past Medical History:  Diagnosis Date  . BPH (benign prostatic hyperplasia)   . CAD (coronary artery disease) 06/25/2012   a. Inf STEMI s/p cath- DES-mid RCA b. Echo- EF 55%, basal inferior HK  . COPD (chronic obstructive pulmonary disease) ([place])   . OSA (obstructive sleep apnea)   . Tobacco abuse     Outpatient Medications Prior to Visit  Medication Sig Dispense Refill    . albuterol (PROVENTIL HFA;VENTOLIN HFA) 108 (90 BASE) MCG/ACT inhaler Inhale 2 puffs into the lungs every 6 (six) hours as needed for wheezing or shortness of breath. 3 Inhaler 0  . aspirin 81 MG chewable tablet Chew 1 tablet (81 mg total) by mouth daily. (Patient taking differently: Chew 81 mg by mouth at bedtime. )    . atorvastatin (LIPITOR) 80 MG tablet Take 1 tablet (80 mg total) by mouth at bedtime. 90 tablet 3  . clopidogrel (PLAVIX) 75 MG tablet Take 1 tablet (75 mg total) by mouth at bedtime. 90 tablet 3  . finasteride (PROSCAR) 5 MG tablet Take 5 mg by mouth daily.    . metoprolol tartrate (LOPRESSOR) 25 MG tablet Take 1 tablet (25 mg total) by mouth 2 (two) times daily. 180 tablet 3  . Multiple Vitamin (MULTIVITAMIN WITH MINERALS) TABS tablet Take 1 tablet by mouth at bedtime. Centrum silver men    . nitroGLYCERIN (NITROSTAT) 0.4 MG SL tablet Place 1 tablet (0.4 mg total) under the tongue every 5 (five) minutes as needed for chest pain. 75 tablet 2  . tamsulosin (FLOMAX) 0.4 MG CAPS Take 0.8 mg by mouth at bedtime.     . fluticasone-salmeterol (ADVAIR HFA) 115-21 MCG/ACT inhaler Inhale 2 puffs into the lungs 2 (two) times daily. (Patient not taking: Reported on 12/29/2017) 3 Inhaler 0  . oxyCODONE (OXY IR/ROXICODONE) 5 MG immediate release tablet Take 1 tablet (5 mg total) by mouth every 6 (six) hours as needed for moderate pain. 10 tablet 0   No facility-administered medications prior to visit.             Objective:     BP 134/76 (BP Location: Left Arm, Cuff Size: Normal)   Pulse 80   Ht 5\' 7"  (1.702 m)   Wt 189 lb (85.7 kg)   SpO2 95%   BMI 29.60 kg/m   SpO2: 95 %  RA   HEENT: nl dentition, turbinates bilaterally, and oropharynx. Nl external ear canals without cough reflex   NECK :  without JVD/Nodes/TM/ nl carotid upstrokes bilaterally   LUNGS: no acc muscle use,  Nl contour chest which is clear to A and P bilaterally without cough on insp or exp  maneuvers   CV:  RRR  no s3 or murmur or increase in P2, and no edema   ABD:  soft and nontender with nl inspiratory excursion in the supine position. No bruits or organomegaly appreciated, bowel sounds nl  MS:  Nl gait/ ext warm without deformities, calf tenderness, cyanosis or clubbing No obvious joint restrictions   SKIN: warm and dry without lesions    NEURO:  alert, approp, nl sensorium with  no motor or cerebellar deficits apparent.    CXR PA and Lateral:   12/29/2017 :    I personally reviewed  images - impression as follows:   Mod copd, non-specific increased markings bilaterally         Assessment   COPD GOLD 0/1 06/17/13 PFT showed FEV1 2.65 (88%) , ratio 68 , 12% post BD , FVC 96%, DLCO 50%  - Spirometry 12/29/2017  FEV1 2.6 (92%)  Ratio 78 min curvature off all rx   - 12/29/2017  Walked RA x 3 laps @ 185 ft each stopped due to  End of study, fast  Pace, sats 90% at end, mild sob       I reviewed the Fletcher curve with the patient that basically indicates  if you quit smoking when your best day FEV1 is still well preserved (as is clearly  the case here)  it is highly unlikely you will progress to severe disease and informed the patient there was  no medication on the market that has proven to alter the curve/ its downward trajectory  or the likelihood of progression of their disease(unlike other chronic medical conditions such as atheroclerosis where we do think we can change the natural hx with risk reducing meds)    Therefore stopping smoking and maintaining abstinence are  the most important aspects of his care, not choice of inhalers or for that matter, doctors.  Treatment other than smoking cessation  is entirely directed by severity of symptoms and focused also on reducing exacerbations, not attempting to change the natural history of the disease.    He may be prone to mild AB so can treat this is as mild asthma: Based on two studies from NEJM  378; 20 p 1865 (2018)  and 380 : p2020-30 (2019) in pts with mild asthma it is reasonable to use low dose symbicort eg 80 2bid "prn" flare in this setting but I emphasized this was only shown with symbicort and takes advantage of the rapid onset of action but is not the same as "rescue therapy" but can be stopped once the acute symptoms have resolved and the need for rescue has been minimized (< 2 x weekly)      Total time devoted to counseling  > 50 % of initial 60 min office visit:  review case with pt/ discussion of options/alternatives/ personally creating written customized instructions  in presence of pt  then going over those specific  Instructions directly with the pt including how to use all of the meds but in particular covering each new medication in detail and the difference between the maintenance= "automatic" meds and the prns using an action plan format for the latter (If this problem/symptom => do that organization reading Left to right).  Please see AVS from this visit for a full list of these instructions which I personally wrote for this pt and  are unique to this visit.   See device teaching which extended face to face time for this visit      Christinia Gully, MD 12/29/2017

## 2017-12-29 NOTE — Assessment & Plan Note (Addendum)
06/17/13 PFT showed FEV1 2.65 (88%) , ratio 68 , 12% post BD , FVC 96%, DLCO 50%  - Spirometry 12/29/2017  FEV1 2.6 (92%)  Ratio 78 min curvature off all rx   - 12/29/2017  Walked RA x 3 laps @ 185 ft each stopped due to  End of study, fast  Pace, sats 90% at end, mild sob       I reviewed the Fletcher curve with the patient that basically indicates  if you quit smoking when your best day FEV1 is still well preserved (as is clearly  the case here)  it is highly unlikely you will progress to severe disease and informed the patient there was  no medication on the market that has proven to alter the curve/ its downward trajectory  or the likelihood of progression of their disease(unlike other chronic medical conditions such as atheroclerosis where we do think we can change the natural hx with risk reducing meds)    Therefore stopping smoking and maintaining abstinence are  the most important aspects of his care, not choice of inhalers or for that matter, doctors.  Treatment other than smoking cessation  is entirely directed by severity of symptoms and focused also on reducing exacerbations, not attempting to change the natural history of the disease.    He may be prone to mild AB so can treat this is as mild asthma: Based on two studies from NEJM  378; 20 p 1865 (2018) and 380 : p2020-30 (2019) in pts with mild asthma it is reasonable to use low dose symbicort eg 80 2bid "prn" flare in this setting but I emphasized this was only shown with symbicort and takes advantage of the rapid onset of action but is not the same as "rescue therapy" but can be stopped once the acute symptoms have resolved and the need for rescue has been minimized (< 2 x weekly)      Total time devoted to counseling  > 50 % of initial 60 min office visit:  review case with pt/ discussion of options/alternatives/ personally creating written customized instructions  in presence of pt  then going over those specific  Instructions directly  with the pt including how to use all of the meds but in particular covering each new medication in detail and the difference between the maintenance= "automatic" meds and the prns using an action plan format for the latter (If this problem/symptom => do that organization reading Left to right).  Please see AVS from this visit for a full list of these instructions which I personally wrote for this pt and  are unique to this visit.   See device teaching which extended face to face time for this visit

## 2017-12-30 ENCOUNTER — Encounter: Payer: Self-pay | Admitting: Internal Medicine

## 2017-12-30 DIAGNOSIS — H2512 Age-related nuclear cataract, left eye: Secondary | ICD-10-CM | POA: Diagnosis not present

## 2017-12-30 NOTE — Progress Notes (Signed)
Left detailed msg on machine ok per DPR

## 2018-01-13 DIAGNOSIS — R3912 Poor urinary stream: Secondary | ICD-10-CM | POA: Diagnosis not present

## 2018-01-13 DIAGNOSIS — C61 Malignant neoplasm of prostate: Secondary | ICD-10-CM | POA: Diagnosis not present

## 2018-01-13 DIAGNOSIS — R350 Frequency of micturition: Secondary | ICD-10-CM | POA: Diagnosis not present

## 2018-01-13 DIAGNOSIS — N401 Enlarged prostate with lower urinary tract symptoms: Secondary | ICD-10-CM | POA: Diagnosis not present

## 2018-01-25 DIAGNOSIS — R2230 Localized swelling, mass and lump, unspecified upper limb: Secondary | ICD-10-CM | POA: Diagnosis not present

## 2018-03-16 ENCOUNTER — Encounter: Payer: Self-pay | Admitting: Vascular Surgery

## 2018-03-16 ENCOUNTER — Other Ambulatory Visit: Payer: Self-pay

## 2018-03-16 ENCOUNTER — Ambulatory Visit (INDEPENDENT_AMBULATORY_CARE_PROVIDER_SITE_OTHER): Payer: Medicare Other | Admitting: Vascular Surgery

## 2018-03-16 ENCOUNTER — Ambulatory Visit (HOSPITAL_COMMUNITY)
Admission: RE | Admit: 2018-03-16 | Discharge: 2018-03-16 | Disposition: A | Discharge status: Home / Self Care | Payer: Medicare Other | Source: Ambulatory Visit | Attending: Family Medicine | Admitting: Family Medicine

## 2018-03-16 VITALS — BP 130/89 | HR 67 | Resp 18 | Ht 67.0 in | Wt 189.0 lb

## 2018-03-16 DIAGNOSIS — I714 Abdominal aortic aneurysm, without rupture, unspecified: Secondary | ICD-10-CM

## 2018-03-16 NOTE — Progress Notes (Signed)
Patient name: Juan Lynn MRN: [id number] DOB: 1945-04-01 Sex: male  REASON FOR VISIT: 3 month follow-up status post EVAR  IOE:[id number] Juan Lynn is a 73 y.o. 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  He reports no new issues.  He had a type II endoleak on CTA at one month post-op and I recommended short interval follow-up with duplex.  Past Medical History:  Diagnosis Date  . AAA (abdominal aortic aneurysm) ([place])   . BPH (benign prostatic hyperplasia)   . CAD (coronary artery disease) 06/25/2012   a. Inf STEMI s/p cath- DES-mid RCA b. Echo- EF 55%, basal inferior HK  . COPD (chronic obstructive pulmonary disease) ([place])   . OSA (obstructive sleep apnea)   . Tobacco abuse     Past Surgical History:  Procedure Laterality Date  . ABDOMINAL AORTIC ENDOVASCULAR STENT GRAFT N/A 11/11/2017   Procedure: ABDOMINAL AORTIC ENDOVASCULAR STENT GRAFT;  Surgeon: Marty Heck, MD;  Location: [facility];  Service: Vascular;  Laterality: N/A;  . CATARACT EXTRACTION    . CORONARY ANGIOPLASTY WITH STENT PLACEMENT  06/25/2012   100% mid RCA s/p DES, 30% prox AVG Cx, mild luminal irregularities elsewhere; EF 55%  . HERNIA REPAIR    . LEFT HEART CATHETERIZATION WITH CORONARY ANGIOGRAM N/A 06/25/2012   Procedure: LEFT HEART CATHETERIZATION WITH CORONARY ANGIOGRAM;  Surgeon: Burnell Blanks, MD;  Location: [facility] CATH LAB;  Service: Cardiovascular;  Laterality: N/A;  . PERCUTANEOUS CORONARY STENT INTERVENTION (PCI-S)  06/25/2012   Procedure: PERCUTANEOUS CORONARY STENT INTERVENTION (PCI-S);  Surgeon: Burnell Blanks, MD;  Location: [facility] C[facility] LAB;  Service: Cardiovascular;;    Family History  Problem Relation Age of Onset  . Heart attack Father   . CAD Father   . Pancreatic cancer Father   . Emphysema Maternal Grandfather        smoked heavily   . Stomach cancer Paternal Grandmother   . Prostate cancer  Paternal Uncle     SOCIAL HISTORY: Social History   Tobacco Use  . Smoking status: Former Smoker    Packs/day: 1.00    Years: 50.00    Pack years: 50.00    Types: Cigarettes    Last attempt to quit: 06/01/2012    Years since quitting: 5.7  . Smokeless tobacco: Never Used  Substance Use Topics  . Alcohol use: Yes    Comment: 3 beers daily    No Known Allergies  Current Outpatient Medications  Medication Sig Dispense Refill  . albuterol (PROVENTIL HFA;VENTOLIN HFA) 108 (90 BASE) MCG/ACT inhaler Inhale 2 puffs into the lungs every 6 (six) hours as needed for wheezing or shortness of breath. 3 Inhaler 0  . aspirin 81 MG chewable tablet Chew 1 tablet (81 mg total) by mouth daily. (Patient taking differently: Chew 81 mg by mouth at bedtime. )    . atorvastatin (LIPITOR) 80 MG tablet Take 1 tablet (80 mg total) by mouth at bedtime. 90 tablet 3  . budesonide-formoterol (SYMBICORT) 80-4.5 MCG/ACT inhaler Inhale 2 puffs into the lungs 2 (two) times daily. 1 Inhaler 0  . clopidogrel (PLAVIX) 75 MG tablet Take 1 tablet (75 mg total) by mouth at bedtime. 90 tablet 3  . finasteride (PROSCAR) 5 MG tablet Take 5 mg by mouth daily.    . metoprolol tartrate (LOPRESSOR) 25 MG tablet Take 1 tablet (25 mg total) by mouth 2 (two) times daily. 180 tablet 3  . Multiple Vitamin (MULTIVITAMIN WITH  MINERALS) TABS tablet Take 1 tablet by mouth at bedtime. Centrum silver men    . nitroGLYCERIN (NITROSTAT) 0.4 MG SL tablet Place 1 tablet (0.4 mg total) under the tongue every 5 (five) minutes as needed for chest pain. 75 tablet 2  . tamsulosin (FLOMAX) 0.4 MG CAPS Take 0.8 mg by mouth at bedtime.      No current facility-administered medications for this visit.     REVIEW OF SYSTEMS:  [X]  denotes positive finding, [ ]  denotes negative finding Cardiac  Comments:  Chest pain or chest pressure:    Shortness of breath upon exertion:    Short of breath when lying flat:    Irregular heart rhythm:          Vascular    Pain in calf, thigh, or hip brought on by ambulation:    Pain in feet at night that wakes you up from your sleep:     Blood clot in your veins:    Leg swelling:         Pulmonary    Oxygen at home:    Productive cough:     Wheezing:         Neurologic    Sudden weakness in arms or legs:     Sudden numbness in arms or legs:     Sudden onset of difficulty speaking or slurred speech:    Temporary loss of vision in one eye:     Problems with dizziness:         Gastrointestinal    Blood in stool:     Vomited blood:         Genitourinary    Burning when urinating:     Blood in urine:        Psychiatric    Major depression:         Hematologic    Bleeding problems:    Problems with blood clotting too easily:        Skin    Rashes or ulcers:        Constitutional    Fever or chills:      PHYSICAL EXAM: Vitals:   03/16/18 1004  BP: 130/89  Pulse: 67  Resp: 18  SpO2: 90%  Weight: 189 lb (85.7 kg)  Height: 5\' 7"  (1.702 m)    GENERAL: The patient is a well-nourished male, in no acute distress. The vital signs are documented above. CARDIAC: There is a regular rate and rhythm.  VASCULAR:  2+ femoral pulse palpable bilateral grins 2+ PT palpable BLE Feet warm PULMONARY: There is good air exchange bilaterally without wheezing or rales. ABDOMEN: Soft and non-tender with normal pitched bowel sounds.  No pain with palpation of aneurysm.   DATA:   I independently reviewed his duplex today and aneurysm smaller from 5.2 cm to 4.91 cm.   Assessment/Plan:  Overall [name] appears to be doing very well status post aortobiiliac endoprosthesis (EVAR) for a 5.2 cm saccular infrarenal abdominal aortic aneurysm.  He had a type II endoleak noted on his CTA at 1 month postop and I recommended short interval follow-up at 3 months.  On duplex today it appears that his aneurysm is shrinking.  As long as his aneurysm continues to get smaller discussed that we would  follow with duplex for now.  I will see him back in 6 months with EVAR duplex.  Encouraged him to call with questions or concerns.   Marty Heck, MD [facility] Office: [phone] Pager: [place]

## 2018-06-10 DIAGNOSIS — Z955 Presence of coronary angioplasty implant and graft: Secondary | ICD-10-CM | POA: Diagnosis not present

## 2018-06-10 DIAGNOSIS — I252 Old myocardial infarction: Secondary | ICD-10-CM | POA: Diagnosis not present

## 2018-06-10 DIAGNOSIS — Z8679 Personal history of other diseases of the circulatory system: Secondary | ICD-10-CM | POA: Diagnosis not present

## 2018-06-10 DIAGNOSIS — C61 Malignant neoplasm of prostate: Secondary | ICD-10-CM | POA: Diagnosis not present

## 2018-06-10 DIAGNOSIS — J449 Chronic obstructive pulmonary disease, unspecified: Secondary | ICD-10-CM | POA: Diagnosis not present

## 2018-06-10 DIAGNOSIS — E7800 Pure hypercholesterolemia, unspecified: Secondary | ICD-10-CM | POA: Diagnosis not present

## 2018-06-10 DIAGNOSIS — D7589 Other specified diseases of blood and blood-forming organs: Secondary | ICD-10-CM | POA: Diagnosis not present

## 2018-06-14 ENCOUNTER — Telehealth: Payer: Self-pay | Admitting: Internal Medicine

## 2018-06-14 NOTE — Telephone Encounter (Signed)
Spoke with the pt  He is requesting refill on proair and advair 115/21  Last ov states to use symbicort, but he states he has been using advair hfa all this time b/c that is what he was on originally and it works well for him  I already refilled the proair  Please advise if ok to send advair, thanks  Last avs: You continue to have very mild copd that is barely detectable   You may be prone to mild wheezing due to this but should be able to eliminate this using symbicort 80 41ffs in am with an extra 2 puffs 12 hours later if your are having any symptoms at all      Work on inhaler technique:  relax and gently blow all the way out then take a nice smooth deep breath back in, triggering the inhaler at same time you start breathing in.  Hold for up to 5 seconds if you can. Blow out thru nose. Rinse and gargle with water when done       Please remember to go to the  x-ray department downstairs in the basement  for your tests - we will call you with the results when they are available.     Pulmonary follow up is as needed

## 2018-06-15 MED ORDER — FLUTICASONE-SALMETEROL 115-21 MCG/ACT IN AERO: 2.0000 | INHALATION_SPRAY | Freq: Two times a day (BID) | RESPIRATORY_TRACT | 1 refills | Status: DC

## 2018-06-15 NOTE — Telephone Encounter (Signed)
He has very mild dz and so makes no difference at this point which one he choses so fine to refill advair x 6 months then needs either f/u with me or PCP to refill it going forward.

## 2018-06-15 NOTE — Telephone Encounter (Signed)
Looks like encounter was open in error so closing encounter. 

## 2018-06-15 NOTE — Telephone Encounter (Signed)
Spoke with patient in regards to Advair vs Symbicort and what Dr.Wert suggestion were. Pt states he was given a sample of Symbicort last ov. His ins will cover Advair. Pt asked that refills be sent to [facility].  Pt made aware that he needs to f/u with Dr. Melvyn Novas in 6 mos or his pcp for future refills.  Nothing further needed.

## 2018-09-17 DIAGNOSIS — C61 Malignant neoplasm of prostate: Secondary | ICD-10-CM | POA: Diagnosis not present

## 2018-09-22 ENCOUNTER — Telehealth: Payer: Self-pay

## 2018-09-22 NOTE — Telephone Encounter (Signed)
    COVID-19 Pre-Screening Questions:  . In the past 7 to 10 days have you had a cough,  shortness of breath, headache, congestion, fever (100 or greater) body aches, chills, sore throat, or sudden loss of taste or sense of smell? . Have you been around anyone with known Covid 19. . Have you been around anyone who is awaiting Covid 19 test results in the past 7 to 10 days? . Have you been around anyone who has been exposed to Covid 19, or has mentioned symptoms of Covid 19 within the past 7 to 10 days?  If you have any concerns/questions about symptoms patients report during screening (either on the phone or at [facility]). Contact the provider seeing the patient or DOD for further guidance.  If neither are available contact a member of the leadership team.       I called and spoke with patient, he answered no to the above questions. 

## 2018-09-22 NOTE — Progress Notes (Signed)
Chief Complaint  Patient presents with   Follow-up    CAD   History of Present Illness: 73 yo male with history of CAD, COPD, sleep apnea, tobacco abuse, prostate cancer and AAA s/p aortobiiliac endovascular repair September 2019 here today for cardiac follow up. He was [facility] to [facility] 2014 with an inferior STEMI and was found to have an occluded mid RCA, treated with a drug eluting stent. He has residual 30% proximal circumflex stenosis, mild luminal irregularities elsewhere, Echo September 2019 with LVEF-55-60%. No valve disease. He stopped smoking following his MI. Normal ABI October 2018. He had endovascular repair of his AAA September 2019 and is followed in [facility] by Dr. Carlis Abbott.   He is here today for follow up. The patient denies any chest pain, dyspnea, palpitations, lower extremity edema, orthopnea, PND, dizziness, near syncope or syncope.   Primary Care Physician: Leighton Ruff, MD  Past Medical History:  Diagnosis Date   AAA (abdominal aortic aneurysm) [facility])    BPH (benign prostatic hyperplasia)    CAD (coronary artery disease) 06/25/2012   a. Inf STEMI s/p cath- DES-mid RCA b. Echo- EF 55%, basal inferior HK   COPD (chronic obstructive pulmonary disease) ([facility])    OSA (obstructive sleep apnea)    Tobacco abuse     Past Surgical History:  Procedure Laterality Date   ABDOMINAL AORTIC ENDOVASCULAR STENT GRAFT N/A 11/11/2017   Procedure: ABDOMINAL AORTIC ENDOVASCULAR STENT GRAFT;  Surgeon: Marty Heck, MD;  Location: [facility];  Service: Vascular;  Laterality: N/A;   CATARACT EXTRACTION     CORONARY ANGIOPLASTY WITH STENT PLACEMENT  06/25/2012   100% mid RCA s/p DES, 30% prox AVG Cx, mild luminal irregularities elsewhere; EF 55%   HERNIA REPAIR     LEFT HEART CATHETERIZATION WITH CORONARY ANGIOGRAM N/A 06/25/2012   Procedure: LEFT HEART CATHETERIZATION WITH CORONARY ANGIOGRAM;  Surgeon: Burnell Blanks, MD;  Location: [facility] CATH LAB;  Service:  Cardiovascular;  Laterality: N/A;   PERCUTANEOUS CORONARY STENT INTERVENTION (PCI-S)  06/25/2012   Procedure: PERCUTANEOUS CORONARY STENT INTERVENTION (PCI-S);  Surgeon: Burnell Blanks, MD;  Location: [facility] CATH LAB;  Service: Cardiovascular;;    Current Outpatient Medications  Medication Sig Dispense Refill   albuterol (PROVENTIL HFA;VENTOLIN HFA) 108 (90 BASE) MCG/ACT inhaler Inhale 2 puffs into the lungs every 6 (six) hours as needed for wheezing or shortness of breath. 3 Inhaler 0   aspirin 81 MG chewable tablet Chew 1 tablet (81 mg total) by mouth daily. (Patient taking differently: Chew 81 mg by mouth at bedtime. )     atorvastatin (LIPITOR) 80 MG tablet Take 1 tablet (80 mg total) by mouth at bedtime. 90 tablet 3   clopidogrel (PLAVIX) 75 MG tablet Take 1 tablet (75 mg total) by mouth at bedtime. 90 tablet 3   finasteride (PROSCAR) 5 MG tablet Take 5 mg by mouth daily.     fluticasone-salmeterol (ADVAIR HFA) 115-21 MCG/ACT inhaler Inhale 2 puffs into the lungs 2 (two) times daily. 3 Inhaler 1   Multiple Vitamin (MULTIVITAMIN WITH MINERALS) TABS tablet Take 1 tablet by mouth at bedtime. Centrum silver men     nitroGLYCERIN (NITROSTAT) 0.4 MG SL tablet Place 1 tablet (0.4 mg total) under the tongue every 5 (five) minutes as needed for chest pain. 75 tablet 2   tamsulosin (FLOMAX) 0.4 MG CAPS Take 0.8 mg by mouth at bedtime.      metoprolol tartrate (LOPRESSOR) 25 MG tablet Take 1 tablet (25 mg total) by  mouth 2 (two) times daily. 180 tablet 3   No current facility-administered medications for this visit.     No Known Allergies  Social History   Socioeconomic History   Marital status: Divorced    Spouse name: Not on file   Number of children: 2   Years of education: Not on file   Highest education level: Not on file  Occupational History   Occupation: Retired  Scientist, product/process development strain: Not on file   Food insecurity    Worry: Not on file     Inability: Not on Lexicographer needs    Medical: Not on file    Non-medical: Not on file  Tobacco Use   Smoking status: Former Smoker    Packs/day: 1.00    Years: 50.00    Pack years: 50.00    Types: Cigarettes    Quit date: 06/01/2012    Years since quitting: 6.3   Smokeless tobacco: Never Used  Substance and Sexual Activity   Alcohol use: Yes    Comment: 3 beers daily   Drug use: No   Sexual activity: Not on file  Lifestyle   Physical activity    Days per week: Not on file    Minutes per session: Not on file   Stress: Not on file  Relationships   Social connections    Talks on phone: Not on file    Gets together: Not on file    Attends religious service: Not on file    Active member of club or organization: Not on file    Attends meetings of clubs or organizations: Not on file    Relationship status: Not on file   Intimate partner violence    Fear of current or ex partner: Not on file    Emotionally abused: Not on file    Physically abused: Not on file    Forced sexual activity: Not on file  Other Topics Concern   Not on file  Social History Narrative   Patient lives with his daughter    Family History  Problem Relation Age of Onset   Heart attack Father    CAD Father    Pancreatic cancer Father    Emphysema Maternal Grandfather        smoked heavily    Stomach cancer Paternal Grandmother    Prostate cancer Paternal Uncle     Review of Systems:  As stated in the HPI and otherwise negative.   BP 112/78    Pulse 66    Ht 5\' 7"  (1.702 m)    Wt 196 lb (88.9 kg)    SpO2 97%    BMI 30.70 kg/m   Physical Examination:  General: Well developed, well nourished, NAD  HEENT: OP clear, mucus membranes moist  SKIN: warm, dry. No rashes. Neuro: No focal deficits  Musculoskeletal: Muscle strength 5/5 all ext  Psychiatric: Mood and affect normal  Neck: No JVD, no carotid bruits, no thyromegaly, no lymphadenopathy.  Lungs:Clear bilaterally,  no wheezes, rhonci, crackles Cardiovascular: Regular rate and rhythm. No murmurs, gallops or rubs. Abdomen:Soft. Bowel sounds present. Non-tender.  Extremities: No lower extremity edema. Pulses are 2 + in the bilateral DP/PT.  EKG:  EKG is not ordered today. The ekg ordered today demonstrates.NSR, rate 66 bpm  Recent Labs: 11/10/2017: ALT 20 11/11/2017: Magnesium 1.8 11/12/2017: BUN 12; Creatinine, Ser 1.12; Hemoglobin 13.5; Platelets 109; Potassium 3.6; Sodium 139   Lipid Panel    Component Value  Date/Time   CHOL 121 11/10/2016 0000   TRIG 115 11/10/2016 0000   HDL 47 11/10/2016 0000   CHOLHDL 2.6 11/10/2016 0000   CHOLHDL 2 08/11/2012 0924   VLDL 16.4 08/11/2012 0924   LDLCALC 51 11/10/2016 0000     Wt Readings from Last 3 Encounters:  09/23/18 196 lb (88.9 kg)  03/16/18 189 lb (85.7 kg)  12/29/17 189 lb (85.7 kg)     Other studies Reviewed: Additional studies/ records that were reviewed today include: . Review of the above records demonstrates:   Assessment and Plan:   1. CAD without angina: No chest pain. Continue ASA, Plavix, statin and beta blocker. Lipids followed in primary care and LDL at goal in October 2019.   2. Tobacco abuse: He has stopped smoking.   3. AAA: s/p endovascular repair September 2019 by Dr. Carlis Abbott.   Current medicines are reviewed at length with the patient today.  The patient does not have concerns regarding medicines.  The following changes have been made:  no change  Labs/ tests ordered today include:   Orders Placed This Encounter  Procedures   EKG 12-Lead   Disposition:   FU with me in 12 months  Signed, Lauree Chandler, MD 09/23/2018 8:52 AM    [facility] [place], [place], [place]  [address] Phone: [phone]; Fax: [phone]

## 2018-09-23 ENCOUNTER — Ambulatory Visit (INDEPENDENT_AMBULATORY_CARE_PROVIDER_SITE_OTHER): Payer: Medicare Other | Admitting: Cardiovascular Disease

## 2018-09-23 ENCOUNTER — Other Ambulatory Visit: Payer: Self-pay

## 2018-09-23 ENCOUNTER — Encounter: Payer: Self-pay | Admitting: Cardiovascular Disease

## 2018-09-23 VITALS — BP 112/78 | HR 66 | Ht 67.0 in | Wt 196.0 lb

## 2018-09-23 DIAGNOSIS — I714 Abdominal aortic aneurysm, without rupture, unspecified: Secondary | ICD-10-CM

## 2018-09-23 DIAGNOSIS — I2510 Atherosclerotic heart disease of native coronary artery without angina pectoris: Secondary | ICD-10-CM | POA: Diagnosis not present

## 2018-09-23 NOTE — Patient Instructions (Signed)

## 2018-09-28 ENCOUNTER — Ambulatory Visit: Payer: Medicare Other | Admitting: Vascular Surgery

## 2018-09-28 ENCOUNTER — Telehealth: Payer: Self-pay | Admitting: *Deleted

## 2018-09-28 ENCOUNTER — Other Ambulatory Visit (HOSPITAL_COMMUNITY): Payer: Medicare Other

## 2018-09-28 NOTE — Telephone Encounter (Signed)
Virtual Visit Pre-Appointment Phone Call  Today, I spoke with Juan Lynn and performed the following actions:  1. I explained that we are currently trying to limit exposure to the COVID-19 virus by seeing patients at home rather than in the office.  I explained that the visits are best done by video, but can be done by telephone.  I asked the patient if a virtual visit that the patient would like to try instead of coming into the office. Juan Lynn agreed to proceed with the virtual visit scheduled with Juan Lynn on 10/12/18.     2. I confirmed the BEST phone number to call the day of the visit and- I included this in appointment notes.  3. I asked if the patient had access to (through a family member/friend) a smartphone with video capability to be used for his visit?"  The patient said yes -    4. I confirmed consent by  a. sending through [place] or by email the [place] as written at the end of this message or  b. verbally as listed below. i. This visit is being performed in the setting of COVID-19. ii. All virtual visits are billed to your insurance company just like a normal visit would be.   iii. We'd like you to understand that the technology does not allow for your provider to perform an examination, and thus may limit your provider's ability to fully assess your condition.  iv. If your provider identifies any concerns that need to be evaluated in person, we will make arrangements to do so.   v. Finally, though the technology is pretty good, we cannot assure that it will always work on either your or our end, and in the setting of a video visit, we may have to convert it to a phone-only visit.  In either situation, we cannot ensure that we have a secure connection.   vi. Are you willing to proceed?"  STAFF: Did the patient verbally acknowledge consent to telehealth visit? Document YES/NO here: YES  2. I advised the patient to  be prepared - I asked that the patient, on the day of his visit, record any information possible with the equipment at his home, such as blood pressure, pulse, oxygen saturation, and your weight and write them all down. I asked the patient to have a pen and paper handy nearby the day of the visit as well.  3. If the patient was scheduled for a video visit, I informed the patient that the visit with the doctor would start with a text to the smartphone # given to [place] by the patient.         If the patient was scheduled for a telephone call, I informed the patient that the visit with the doctor would start with a call to the telephone # given to [place] by the patient.  4. I Informed patient they will receive a phone call 15 minutes prior to their appointment time from a [place] or nurse to review medications, allergies, etc. to prepare for the visit.    TELEPHONE CALL NOTE  Juan Lynn has been deemed a candidate for a follow-up tele-health visit to limit community exposure during the Covid-19 pandemic. I spoke with the patient via phone to ensure availability of phone/video source, confirm preferred email & phone number, and discuss instructions and expectations.  I reminded Juan Lynn to be prepared with any vital sign and/or heart rhythm information that  could potentially be obtained via home monitoring, at the time of his visit. I reminded Juan Lynn to expect a phone call prior to his visit.  Juan Lynn, NT 09/28/2018 4:18 PM     FULL LENGTH CONSENT FOR TELE-HEALTH VISIT   I hereby voluntarily request, consent and authorize [facility] and its employed or contracted physicians, physician assistants, nurse practitioners or other licensed health care professionals (the Practitioner), to provide me with telemedicine health care services (the "Services") as deemed necessary by the treating Practitioner. I acknowledge and consent to receive the Services by the Practitioner via  telemedicine. I understand that the telemedicine visit will involve communicating with the Practitioner through live audiovisual communication technology and the disclosure of certain medical information by electronic transmission. I acknowledge that I have been given the opportunity to request an in-person assessment or other available alternative prior to the telemedicine visit and am voluntarily participating in the telemedicine visit.  I understand that I have the right to withhold or withdraw my consent to the use of telemedicine in the course of my care at any time, without affecting my right to future care or treatment, and that the Practitioner or I may terminate the telemedicine visit at any time. I understand that I have the right to inspect all information obtained and/or recorded in the course of the telemedicine visit and may receive copies of available information for a reasonable fee.  I understand that some of the potential risks of receiving the Services via telemedicine include:  Marland Kitchen Delay or interruption in medical evaluation due to technological equipment failure or disruption; . Information transmitted may not be sufficient (e.g. poor resolution of images) to allow for appropriate medical decision making by the Practitioner; and/or  . In rare instances, security protocols could fail, causing a breach of personal health information.  Furthermore, I acknowledge that it is my responsibility to provide information about my medical history, conditions and care that is complete and accurate to the best of my ability. I acknowledge that Practitioner's advice, recommendations, and/or decision may be based on factors not within their control, such as incomplete or inaccurate data provided by me or distortions of diagnostic images or specimens that may result from electronic transmissions. I understand that the practice of medicine is not an exact science and that Practitioner makes no warranties or  guarantees regarding treatment outcomes. I acknowledge that I will receive a copy of this consent concurrently upon execution via email to the email address I last provided but may also request a printed copy by calling the office of [place].    I understand that my insurance will be billed for this visit.   I have read or had this consent read to me. . I understand the contents of this consent, which adequately explains the benefits and risks of the Services being provided via telemedicine.  . I have been provided ample opportunity to ask questions regarding this consent and the Services and have had my questions answered to my satisfaction. . I give my informed consent for the services to be provided through the use of telemedicine in my medical care  By participating in this telemedicine visit I agree to the above.

## 2018-10-05 ENCOUNTER — Other Ambulatory Visit (HOSPITAL_COMMUNITY): Payer: Medicare Other

## 2018-10-05 ENCOUNTER — Ambulatory Visit: Payer: Medicare Other | Admitting: Vascular Surgery

## 2018-10-07 ENCOUNTER — Other Ambulatory Visit: Payer: Self-pay

## 2018-10-07 DIAGNOSIS — I714 Abdominal aortic aneurysm, without rupture, unspecified: Secondary | ICD-10-CM

## 2018-10-11 ENCOUNTER — Telehealth (HOSPITAL_COMMUNITY): Payer: Self-pay | Admitting: Rehabilitation

## 2018-10-11 DIAGNOSIS — G4733 Obstructive sleep apnea (adult) (pediatric): Secondary | ICD-10-CM | POA: Diagnosis not present

## 2018-10-11 NOTE — Telephone Encounter (Signed)

## 2018-10-12 ENCOUNTER — Encounter: Payer: Self-pay | Admitting: Vascular Surgery

## 2018-10-12 ENCOUNTER — Ambulatory Visit (INDEPENDENT_AMBULATORY_CARE_PROVIDER_SITE_OTHER): Payer: Medicare Other | Admitting: Vascular Surgery

## 2018-10-12 ENCOUNTER — Other Ambulatory Visit: Payer: Self-pay

## 2018-10-12 ENCOUNTER — Ambulatory Visit (HOSPITAL_COMMUNITY)
Admission: RE | Admit: 2018-10-12 | Discharge: 2018-10-12 | Disposition: A | Discharge status: Home / Self Care | Payer: Medicare Other | Source: Ambulatory Visit | Attending: Family | Admitting: Family

## 2018-10-12 VITALS — BP 139/86 | HR 61 | Temp 97.2°F | Resp 18 | Ht 67.0 in | Wt 198.0 lb

## 2018-10-12 DIAGNOSIS — I714 Abdominal aortic aneurysm, without rupture, unspecified: Secondary | ICD-10-CM

## 2018-10-12 NOTE — Progress Notes (Signed)
Patient name: Juan Lynn MRN: [id number] DOB: [birth year] Sex: male  REASON FOR VISIT: 6 month follow-up for ongoing surveillance s/p EVAR  Juan Lynn:Juan Lynn is a 73 y.o. 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  He reports no new issues.  He had a type II endoleak on CTA at one month post-op and we have followed this with interval EVAR duplex with no growth of the aneurysm.  Reports no new issues with other health problems.  No back or abdominal pain.  Feet feel fine.  Past Medical History:  Diagnosis Date  . AAA (abdominal aortic aneurysm) ([place])   . BPH (benign prostatic hyperplasia)   . CAD (coronary artery disease) 06/25/2012   a. Inf STEMI s/p cath- DES-mid RCA b. Echo- EF 55%, basal inferior HK  . COPD (chronic obstructive pulmonary disease) ([place])   . OSA (obstructive sleep apnea)   . Tobacco abuse     Past Surgical History:  Procedure Laterality Date  . ABDOMINAL AORTIC ENDOVASCULAR STENT GRAFT N/A 11/11/2017   Procedure: ABDOMINAL AORTIC ENDOVASCULAR STENT GRAFT;  Surgeon: Marty Heck, MD;  Location: [place];  Service: Vascular;  Laterality: N/A;  . CATARACT EXTRACTION    . CORONARY ANGIOPLASTY WITH STENT PLACEMENT  06/25/2012   100% mid RCA s/p DES, 30% prox AVG Cx, mild luminal irregularities elsewhere; EF 55%  . HERNIA REPAIR    . LEFT HEART CATHETERIZATION WITH CORONARY ANGIOGRAM N/A 06/25/2012   Procedure: LEFT HEART CATHETERIZATION WITH CORONARY ANGIOGRAM;  Surgeon: Burnell Blanks, MD;  Location: [facility];  Service: Cardiovascular;  Laterality: N/A;  . PERCUTANEOUS CORONARY STENT INTERVENTION (PCI-S)  06/25/2012   Procedure: PERCUTANEOUS CORONARY STENT INTERVENTION (PCI-S);  Surgeon: Burnell Blanks, MD;  Location: [facility] CATH LAB;  Service: Cardiovascular;;    Family History  Problem Relation Age of Onset  . Heart attack Father   . CAD Father   .  Pancreatic cancer Father   . Emphysema Maternal Grandfather        smoked heavily   . Stomach cancer Paternal Grandmother   . Prostate cancer Paternal Uncle     SOCIAL HISTORY: Social History   Tobacco Use  . Smoking status: Former Smoker    Packs/day: 1.00    Years: 50.00    Pack years: 50.00    Types: Cigarettes    Quit date: 06/01/2012    Years since quitting: 6.3  . Smokeless tobacco: Never Used  Substance Use Topics  . Alcohol use: Yes    Comment: 3 beers daily    No Known Allergies  Current Outpatient Medications  Medication Sig Dispense Refill  . albuterol (PROVENTIL HFA;VENTOLIN HFA) 108 (90 BASE) MCG/ACT inhaler Inhale 2 puffs into the lungs every 6 (six) hours as needed for wheezing or shortness of breath. 3 Inhaler 0  . aspirin 81 MG chewable tablet Chew 1 tablet (81 mg total) by mouth daily. (Patient taking differently: Chew 81 mg by mouth at bedtime. )    . atorvastatin (LIPITOR) 80 MG tablet Take 1 tablet (80 mg total) by mouth at bedtime. 90 tablet 3  . clopidogrel (PLAVIX) 75 MG tablet Take 1 tablet (75 mg total) by mouth at bedtime. 90 tablet 3  . finasteride (PROSCAR) 5 MG tablet Take 5 mg by mouth daily.    . fluticasone-salmeterol (ADVAIR HFA) 115-21 MCG/ACT inhaler Inhale 2 puffs into the lungs 2 (two) times daily. 3 Inhaler 1  .  Multiple Vitamin (MULTIVITAMIN WITH MINERALS) TABS tablet Take 1 tablet by mouth at bedtime. Centrum silver men    . nitroGLYCERIN (NITROSTAT) 0.4 MG SL tablet Place 1 tablet (0.4 mg total) under the tongue every 5 (five) minutes as needed for chest pain. 75 tablet 2  . tamsulosin (FLOMAX) 0.4 MG CAPS Take 0.8 mg by mouth at bedtime.     . metoprolol tartrate (LOPRESSOR) 25 MG tablet Take 1 tablet (25 mg total) by mouth 2 (two) times daily. 180 tablet 3   No current facility-administered medications for this visit.     REVIEW OF SYSTEMS:  [X]  denotes positive finding, [ ]  denotes negative finding Cardiac  Comments:  Chest pain  or chest pressure:    Shortness of breath upon exertion:    Short of breath when lying flat:    Irregular heart rhythm:        Vascular    Pain in calf, thigh, or hip brought on by ambulation:    Pain in feet at night that wakes you up from your sleep:     Blood clot in your veins:    Leg swelling:         Pulmonary    Oxygen at home:    Productive cough:     Wheezing:         Neurologic    Sudden weakness in arms or legs:     Sudden numbness in arms or legs:     Sudden onset of difficulty speaking or slurred speech:    Temporary loss of vision in one eye:     Problems with dizziness:         Gastrointestinal    Blood in stool:     Vomited blood:         Genitourinary    Burning when urinating:     Blood in urine:        Psychiatric    Major depression:         Hematologic    Bleeding problems:    Problems with blood clotting too easily:        Skin    Rashes or ulcers:        Constitutional    Fever or chills:      PHYSICAL EXAM: Vitals:   10/12/18 0921  BP: 139/86  Pulse: 61  Resp: 18  Temp: (!) 97.2 F (36.2 C)  TempSrc: Temporal  SpO2: 97%  Weight: 198 lb (89.8 kg)  Height: 5\' 7"  (1.702 m)    GENERAL: The patient is a well-nourished male, in no acute distress. The vital signs are documented above. CARDIAC: There is a regular rate and rhythm.  VASCULAR:  2+ femoral pulse palpable bilateral grins 2+ PT palpable BLE PULMONARY: There is good air exchange bilaterally without wheezing or rales. ABDOMEN: Soft and non-tender with normal pitched bowel sounds.  No pain with palpation of aneurysm.   DATA:   EVAR duplex today shows no evidence of an endoleak that has been previously demonstrated.  The maximal diameter of the infrarenal aneurysm is 5.2 cm.  This is slightly larger since it measured 5 cm 6 months ago but is the same size it measured at the initial time of endovascular repair so no overall growth.  Both limbs look widely patent.  This was a  very limited study as noted by the tech.   Assessment/Plan:  Overall Juan Lynn appears to be doing very well status post aortobiiliac endoprosthesis (EVAR) for a  5.2 cm saccular infrarenal abdominal aortic aneurysm on 11/11/17.  He had a type II endoleak noted on his CTA at 1 month postop and I recommended short interval follow-up.  No evidence of endoleak on today's duplex although this was a limited study.  Aneurysm measures 5.2 cm on duplex today - slightly larger than 5 on duplex 6 months ago but limited study.  Given known history of endoleak (although not seen today)  I recommended another EVAR duplex in 6 months.  He will call us if he has any questions or concerns.   Marty Heck, MD [facility] Office: [phone] Pager: [place]

## 2018-10-13 ENCOUNTER — Other Ambulatory Visit: Payer: Self-pay | Admitting: Urology

## 2018-10-13 DIAGNOSIS — C61 Malignant neoplasm of prostate: Secondary | ICD-10-CM

## 2018-12-13 ENCOUNTER — Ambulatory Visit: Payer: Medicare Other

## 2018-12-13 ENCOUNTER — Other Ambulatory Visit: Payer: Self-pay | Admitting: Family Medicine

## 2018-12-13 ENCOUNTER — Other Ambulatory Visit: Payer: Self-pay

## 2018-12-13 DIAGNOSIS — R600 Localized edema: Secondary | ICD-10-CM | POA: Diagnosis not present

## 2018-12-13 DIAGNOSIS — Z0000 Encounter for general adult medical examination without abnormal findings: Secondary | ICD-10-CM | POA: Diagnosis not present

## 2018-12-13 DIAGNOSIS — I252 Old myocardial infarction: Secondary | ICD-10-CM | POA: Diagnosis not present

## 2018-12-13 DIAGNOSIS — Z8679 Personal history of other diseases of the circulatory system: Secondary | ICD-10-CM | POA: Diagnosis not present

## 2018-12-13 DIAGNOSIS — R030 Elevated blood-pressure reading, without diagnosis of hypertension: Secondary | ICD-10-CM | POA: Diagnosis not present

## 2018-12-13 DIAGNOSIS — Z23 Encounter for immunization: Secondary | ICD-10-CM | POA: Diagnosis not present

## 2018-12-13 DIAGNOSIS — D7589 Other specified diseases of blood and blood-forming organs: Secondary | ICD-10-CM | POA: Diagnosis not present

## 2018-12-13 DIAGNOSIS — J449 Chronic obstructive pulmonary disease, unspecified: Secondary | ICD-10-CM | POA: Diagnosis not present

## 2018-12-13 DIAGNOSIS — N400 Enlarged prostate without lower urinary tract symptoms: Secondary | ICD-10-CM | POA: Diagnosis not present

## 2018-12-13 DIAGNOSIS — M7989 Other specified soft tissue disorders: Secondary | ICD-10-CM

## 2018-12-13 DIAGNOSIS — L989 Disorder of the skin and subcutaneous tissue, unspecified: Secondary | ICD-10-CM | POA: Diagnosis not present

## 2018-12-13 DIAGNOSIS — E7800 Pure hypercholesterolemia, unspecified: Secondary | ICD-10-CM | POA: Diagnosis not present

## 2018-12-13 DIAGNOSIS — C61 Malignant neoplasm of prostate: Secondary | ICD-10-CM | POA: Diagnosis not present

## 2018-12-15 ENCOUNTER — Other Ambulatory Visit: Payer: Self-pay | Admitting: Cardiovascular Disease

## 2018-12-15 MED ORDER — CLOPIDOGREL BISULFATE 75 MG PO TABS: 75.0000 mg | ORAL_TABLET | Freq: Every day | ORAL | 2 refills | Status: DC

## 2018-12-15 MED ORDER — ATORVASTATIN CALCIUM 80 MG PO TABS: 80.0000 mg | ORAL_TABLET | Freq: Every day | ORAL | 2 refills | Status: DC

## 2018-12-15 MED ORDER — METOPROLOL TARTRATE 25 MG PO TABS: 25.0000 mg | ORAL_TABLET | Freq: Two times a day (BID) | ORAL | 2 refills | Status: DC

## 2018-12-15 MED ORDER — NITROGLYCERIN 0.4 MG SL SUBL: 0.4000 mg | SUBLINGUAL_TABLET | SUBLINGUAL | 1 refills | Status: DC | PRN

## 2018-12-15 NOTE — Telephone Encounter (Signed)
Pt's medications were sent to pt's pharmacy as requested. Confirmation received.  

## 2018-12-21 DIAGNOSIS — B078 Other viral warts: Secondary | ICD-10-CM | POA: Diagnosis not present

## 2019-01-10 ENCOUNTER — Other Ambulatory Visit: Payer: Self-pay

## 2019-01-10 ENCOUNTER — Ambulatory Visit
Admission: RE | Admit: 2019-01-10 | Discharge: 2019-01-10 | Disposition: A | Discharge status: Home / Self Care | Payer: Medicare Other | Source: Ambulatory Visit | Attending: Urology | Admitting: Urology

## 2019-01-10 DIAGNOSIS — C61 Malignant neoplasm of prostate: Secondary | ICD-10-CM | POA: Diagnosis not present

## 2019-01-10 MED ORDER — GADOBENATE DIMEGLUMINE 529 MG/ML IV SOLN
18.0000 mL | Freq: Once | INTRAVENOUS | Status: AC | PRN
  Administered 2019-01-10: 18 mL via INTRAVENOUS

## 2019-01-14 DIAGNOSIS — C61 Malignant neoplasm of prostate: Secondary | ICD-10-CM | POA: Diagnosis not present

## 2019-01-14 DIAGNOSIS — R351 Nocturia: Secondary | ICD-10-CM | POA: Diagnosis not present

## 2019-01-14 DIAGNOSIS — N401 Enlarged prostate with lower urinary tract symptoms: Secondary | ICD-10-CM | POA: Diagnosis not present

## 2019-03-28 ENCOUNTER — Ambulatory Visit: Payer: Medicare Other | Attending: Internal Medicine

## 2019-03-28 DIAGNOSIS — Z23 Encounter for immunization: Secondary | ICD-10-CM | POA: Insufficient documentation

## 2019-03-28 NOTE — Progress Notes (Signed)
   Covid-19 Vaccination Clinic  Name:  Juan Lynn    MRN: [id number] DOB: 09/27/1945  03/28/2019  [name] was observed post Covid-19 immunization for 15 minutes without incidence. He was provided with Vaccine Information Sheet and instruction to access the V-Safe system.   [name] was instructed to call 911 with any severe reactions post vaccine: Marland Kitchen Difficulty breathing  . Swelling of your face and throat  . A fast heartbeat  . A bad rash all over your body  . Dizziness and weakness    Immunizations Administered    Name Date Dose VIS Date Route   Pfizer COVID-19 Vaccine 03/28/2019 12:44 PM 0.3 mL 02/11/2019 Intramuscular   Manufacturer: [place]   Lot: [id number]   [place]: [id number]

## 2019-04-18 ENCOUNTER — Telehealth (HOSPITAL_COMMUNITY): Payer: Self-pay

## 2019-04-18 ENCOUNTER — Other Ambulatory Visit: Payer: Self-pay

## 2019-04-18 ENCOUNTER — Ambulatory Visit: Payer: Medicare Other | Attending: Internal Medicine

## 2019-04-18 DIAGNOSIS — I714 Abdominal aortic aneurysm, without rupture, unspecified: Secondary | ICD-10-CM

## 2019-04-18 DIAGNOSIS — Z23 Encounter for immunization: Secondary | ICD-10-CM | POA: Insufficient documentation

## 2019-04-18 NOTE — Telephone Encounter (Signed)

## 2019-04-18 NOTE — Progress Notes (Signed)
   Covid-19 Vaccination Clinic  Name:  Juan Lynn    MRN: [id number] DOB: 05-28-1945  04/18/2019  Juan Lynn was observed post Covid-19 immunization for 15 minutes without incidence. He was provided with Vaccine Information Sheet and instruction to access the V-Safe system.   Juan Lynn was instructed to call 911 with any severe reactions post vaccine: Marland Kitchen Difficulty breathing  . Swelling of your face and throat  . A fast heartbeat  . A bad rash all over your body  . Dizziness and weakness    Immunizations Administered    Name Date Dose VIS Date Route   Pfizer COVID-19 Vaccine 04/18/2019 12:35 PM 0.3 mL 02/11/2019 Intramuscular   Manufacturer: [place]   Lot: [id number]   [place]: [id number]

## 2019-04-19 ENCOUNTER — Ambulatory Visit (INDEPENDENT_AMBULATORY_CARE_PROVIDER_SITE_OTHER): Payer: Medicare Other | Admitting: Vascular Surgery

## 2019-04-19 ENCOUNTER — Encounter: Payer: Self-pay | Admitting: Vascular Surgery

## 2019-04-19 ENCOUNTER — Other Ambulatory Visit: Payer: Self-pay

## 2019-04-19 ENCOUNTER — Ambulatory Visit (HOSPITAL_COMMUNITY)
Admission: RE | Admit: 2019-04-19 | Discharge: 2019-04-19 | Disposition: A | Discharge status: Home / Self Care | Payer: Medicare Other | Source: Ambulatory Visit | Attending: Vascular Surgery | Admitting: Vascular Surgery

## 2019-04-19 VITALS — BP 119/77 | HR 69 | Temp 97.3°F | Resp 18 | Ht 67.5 in | Wt 194.0 lb

## 2019-04-19 DIAGNOSIS — I714 Abdominal aortic aneurysm, without rupture, unspecified: Secondary | ICD-10-CM

## 2019-04-19 NOTE — Progress Notes (Signed)
Patient name: Juan Lynn MRN: [id number] DOB: 07-Aug-1945 Sex: male  REASON FOR VISIT: 6 month follow-up for ongoing surveillance s/p EVAR  [id number] [name] is a 74 y.o. 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  He had a type II endoleak on CTA at one month post-op and we have followed this with interval EVAR duplex with no growth of the aneurysm.  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  Reports no new issues over the past 6 months.  No back or abdominal pain.    Past Medical History:  Diagnosis Date  . AAA (abdominal aortic aneurysm) ([place])   . BPH (benign prostatic hyperplasia)   . CAD (coronary artery disease) 06/25/2012   a. Inf STEMI s/p cath- DES-mid RCA b. Echo- EF 55%, basal inferior HK  . COPD (chronic obstructive pulmonary disease) ([place])   . OSA (obstructive sleep apnea)   . Tobacco abuse     Past Surgical History:  Procedure Laterality Date  . ABDOMINAL AORTIC ENDOVASCULAR STENT GRAFT N/A 11/11/2017   Procedure: ABDOMINAL AORTIC ENDOVASCULAR STENT GRAFT;  Surgeon: Marty Heck, MD;  Location: [facility];  Service: Vascular;  Laterality: N/A;  . CATARACT EXTRACTION    . CORONARY ANGIOPLASTY WITH STENT PLACEMENT  06/25/2012   100% mid RCA s/p DES, 30% prox AVG Cx, mild luminal irregularities elsewhere; EF 55%  . HERNIA REPAIR    . LEFT HEART CATHETERIZATION WITH CORONARY ANGIOGRAM N/A 06/25/2012   Procedure: LEFT HEART CATHETERIZATION WITH CORONARY ANGIOGRAM;  Surgeon: Burnell Blanks, MD;  Location: [facility] C[facility] LAB;  Service: Cardiovascular;  Laterality: N/A;  . PERCUTANEOUS CORONARY STENT INTERVENTION (PCI-S)  06/25/2012   Procedure: PERCUTANEOUS CORONARY STENT INTERVENTION (PCI-S);  Surgeon: Burnell Blanks, MD;  Location: [facility] CATH LAB;  Service: Cardiovascular;;    Family History  Problem Relation Age of Onset  . Heart attack Father   . CAD Father   .  Pancreatic cancer Father   . Emphysema Maternal Grandfather        smoked heavily   . Stomach cancer Paternal Grandmother   . Prostate cancer Paternal Uncle     SOCIAL HISTORY: Social History   Tobacco Use  . Smoking status: Former Smoker    Packs/day: 1.00    Years: 50.00    Pack years: 50.00    Types: Cigarettes    Quit date: 06/01/2012    Years since quitting: 6.8  . Smokeless tobacco: Never Used  Substance Use Topics  . Alcohol use: Yes    Comment: 3 beers daily    No Known Allergies  Current Outpatient Medications  Medication Sig Dispense Refill  . albuterol (PROVENTIL HFA;VENTOLIN HFA) 108 (90 BASE) MCG/ACT inhaler Inhale 2 puffs into the lungs every 6 (six) hours as needed for wheezing or shortness of breath. 3 Inhaler 0  . aspirin 81 MG chewable tablet Chew 1 tablet (81 mg total) by mouth daily. (Patient taking differently: Chew 81 mg by mouth at bedtime. )    . atorvastatin (LIPITOR) 80 MG tablet Take 1 tablet (80 mg total) by mouth at bedtime. 90 tablet 2  . clopidogrel (PLAVIX) 75 MG tablet Take 1 tablet (75 mg total) by mouth at bedtime. 90 tablet 2  . finasteride (PROSCAR) 5 MG tablet Take 5 mg by mouth daily.    . fluticasone-salmeterol (ADVAIR HFA) 115-21 MCG/ACT inhaler Inhale 2 puffs into the lungs 2 (two) times daily. 3 Inhaler  1  . metoprolol tartrate (LOPRESSOR) 25 MG tablet Take 1 tablet (25 mg total) by mouth 2 (two) times daily. 180 tablet 2  . Multiple Vitamin (MULTIVITAMIN WITH MINERALS) TABS tablet Take 1 tablet by mouth at bedtime. Centrum silver men    . nitroGLYCERIN (NITROSTAT) 0.4 MG SL tablet Place 1 tablet (0.4 mg total) under the tongue every 5 (five) minutes as needed for chest pain. 75 tablet 1  . tamsulosin (FLOMAX) 0.4 MG CAPS Take 0.8 mg by mouth at bedtime.      No current facility-administered medications for this visit.    REVIEW OF SYSTEMS:  [X]  denotes positive finding, [ ]  denotes negative finding Cardiac  Comments:  Chest pain  or chest pressure:    Shortness of breath upon exertion:    Short of breath when lying flat:    Irregular heart rhythm:        Vascular    Pain in calf, thigh, or hip brought on by ambulation:    Pain in feet at night that wakes you up from your sleep:     Blood clot in your veins:    Leg swelling:         Pulmonary    Oxygen at home:    Productive cough:     Wheezing:         Neurologic    Sudden weakness in arms or legs:     Sudden numbness in arms or legs:     Sudden onset of difficulty speaking or slurred speech:    Temporary loss of vision in one eye:     Problems with dizziness:         Gastrointestinal    Blood in stool:     Vomited blood:         Genitourinary    Burning when urinating:     Blood in urine:        Psychiatric    Major depression:         Hematologic    Bleeding problems:    Problems with blood clotting too easily:        Skin    Rashes or ulcers:        Constitutional    Fever or chills:      PHYSICAL EXAM: Vitals:   04/19/19 0819  BP: 119/77  Pulse: 69  Resp: 18  Temp: (!) 97.3 F (36.3 C)  TempSrc: Temporal  SpO2: 94%  Weight: 194 lb (88 kg)  Height: 5' 7.5" (1.715 m)    GENERAL: The patient is a well-nourished male, in no acute distress. The vital signs are documented above. CARDIAC: There is a regular rate and rhythm.  VASCULAR:  2+ femoral pulse palpable bilateral grins PULMONARY: There is good air exchange bilaterally without wheezing or rales. ABDOMEN: Soft and non-tender with normal pitched bowel sounds.  No pain with palpation of aneurysm.   DATA:   EVAR duplex today shows no evidence of an obvious endoleak.  Aneurysm sac appears to have grown from 5.2 to 5.6 cm on duplex.   Assessment/Plan:  74 yo M s/p aortobiiliac endograft (EVAR) for a 5.2 cm saccular infrarenal abdominal aortic aneurysm on 11/11/17.  He had a type II endoleak noted on his CTA at 1 month postop and we have been following this with no  significant growth.  Duplex today suggest that his aneurysm has now grown from 5.2 to approximately 5.6 cm.  No obvious endoleak was noted on duplex today.  I have recommended CTA abdomen pelvis to further evaluate if the aneurysm is truly growing and also to further evaluate seal of the endograft and ongoing evidence of a type II endoleak.  We will have him follow-up with me after the CTA is obtained to further discuss.   Marty Heck, MD [facility] [place] Office: [place]

## 2019-04-26 ENCOUNTER — Other Ambulatory Visit: Payer: Self-pay

## 2019-04-26 DIAGNOSIS — I998 Other disorder of circulatory system: Secondary | ICD-10-CM

## 2019-05-06 DIAGNOSIS — S46011A Strain of muscle(s) and tendon(s) of the rotator cuff of right shoulder, initial encounter: Secondary | ICD-10-CM | POA: Diagnosis not present

## 2019-05-06 DIAGNOSIS — M25562 Pain in left knee: Secondary | ICD-10-CM | POA: Diagnosis not present

## 2019-05-11 DIAGNOSIS — M25562 Pain in left knee: Secondary | ICD-10-CM | POA: Diagnosis not present

## 2019-05-11 DIAGNOSIS — S46011A Strain of muscle(s) and tendon(s) of the rotator cuff of right shoulder, initial encounter: Secondary | ICD-10-CM | POA: Diagnosis not present

## 2019-05-13 DIAGNOSIS — M25562 Pain in left knee: Secondary | ICD-10-CM | POA: Diagnosis not present

## 2019-05-13 DIAGNOSIS — S46011A Strain of muscle(s) and tendon(s) of the rotator cuff of right shoulder, initial encounter: Secondary | ICD-10-CM | POA: Diagnosis not present

## 2019-05-18 ENCOUNTER — Ambulatory Visit
Admission: RE | Admit: 2019-05-18 | Discharge: 2019-05-18 | Disposition: A | Discharge status: Home / Self Care | Payer: Medicare Other | Source: Ambulatory Visit | Attending: Vascular Surgery | Admitting: Vascular Surgery

## 2019-05-18 DIAGNOSIS — S46011A Strain of muscle(s) and tendon(s) of the rotator cuff of right shoulder, initial encounter: Secondary | ICD-10-CM | POA: Diagnosis not present

## 2019-05-18 DIAGNOSIS — M25562 Pain in left knee: Secondary | ICD-10-CM | POA: Diagnosis not present

## 2019-05-18 DIAGNOSIS — I998 Other disorder of circulatory system: Secondary | ICD-10-CM

## 2019-05-18 DIAGNOSIS — I714 Abdominal aortic aneurysm, without rupture: Secondary | ICD-10-CM | POA: Diagnosis not present

## 2019-05-18 MED ORDER — IOPAMIDOL (ISOVUE-370) INJECTION 76%
75.0000 mL | Freq: Once | INTRAVENOUS | Status: AC | PRN
  Administered 2019-05-18: 14:00:00 75 mL via INTRAVENOUS

## 2019-05-20 DIAGNOSIS — S46011A Strain of muscle(s) and tendon(s) of the rotator cuff of right shoulder, initial encounter: Secondary | ICD-10-CM | POA: Diagnosis not present

## 2019-05-20 DIAGNOSIS — M25562 Pain in left knee: Secondary | ICD-10-CM | POA: Diagnosis not present

## 2019-05-23 ENCOUNTER — Telehealth (HOSPITAL_COMMUNITY): Payer: Self-pay

## 2019-05-23 NOTE — Telephone Encounter (Signed)

## 2019-05-24 ENCOUNTER — Encounter: Payer: Self-pay | Admitting: Vascular Surgery

## 2019-05-24 ENCOUNTER — Ambulatory Visit (INDEPENDENT_AMBULATORY_CARE_PROVIDER_SITE_OTHER): Payer: Medicare Other | Admitting: Vascular Surgery

## 2019-05-24 ENCOUNTER — Other Ambulatory Visit: Payer: Self-pay | Admitting: Vascular Surgery

## 2019-05-24 ENCOUNTER — Other Ambulatory Visit: Payer: Self-pay

## 2019-05-24 VITALS — BP 151/89 | HR 74 | Temp 97.6°F | Resp 16 | Ht 67.0 in | Wt 195.0 lb

## 2019-05-24 DIAGNOSIS — I714 Abdominal aortic aneurysm, without rupture, unspecified: Secondary | ICD-10-CM

## 2019-05-24 DIAGNOSIS — T82330A Leakage of aortic (bifurcation) graft (replacement), initial encounter: Secondary | ICD-10-CM

## 2019-05-24 DIAGNOSIS — IMO0002 Reserved for concepts with insufficient information to code with codable children: Secondary | ICD-10-CM

## 2019-05-24 NOTE — Progress Notes (Signed)
Patient name: Juan Lynn MRN: [id number] DOB: 08/19/1945 Sex: male  REASON FOR VISIT: F/U after CTA to discuss type II endoleak  [id number] Juan Lynn is a 74 y.o. 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  He had a type II endoleak on CTA at one month post-op and we have followed this with interval imaging.  Most recently noted to have continued growth on EVAR duplex.  Subsequently got a CTA that shows his aneurysm has grown from 5.2 to 5.8 cm.  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  He denies any new abdominal or back pain.  Past Medical History:  Diagnosis Date  . AAA (abdominal aortic aneurysm) ([place])   . BPH (benign prostatic hyperplasia)   . CAD (coronary artery disease) 06/25/2012   a. Inf STEMI s/p cath- DES-mid RCA b. Echo- EF 55%, basal inferior HK  . COPD (chronic obstructive pulmonary disease) ([place])   . OSA (obstructive sleep apnea)   . Tobacco abuse     Past Surgical History:  Procedure Laterality Date  . ABDOMINAL AORTIC ENDOVASCULAR STENT GRAFT N/A 11/11/2017   Procedure: ABDOMINAL AORTIC ENDOVASCULAR STENT GRAFT;  Surgeon: Marty Heck, MD;  Location: [place];  Service: Vascular;  Laterality: N/A;  . CATARACT EXTRACTION    . CORONARY ANGIOPLASTY WITH STENT PLACEMENT  06/25/2012   100% mid RCA s/p DES, 30% prox AVG Cx, mild luminal irregularities elsewhere; EF 55%  . HERNIA REPAIR    . LEFT HEART CATHETERIZATION WITH CORONARY ANGIOGRAM N/A 06/25/2012   Procedure: LEFT HEART CATHETERIZATION WITH CORONARY ANGIOGRAM;  Surgeon: Burnell Blanks, MD;  Location: [facility] CATH LAB;  Service: Cardiovascular;  Laterality: N/A;  . PERCUTANEOUS CORONARY STENT INTERVENTION (PCI-S)  06/25/2012   Procedure: PERCUTANEOUS CORONARY STENT INTERVENTION (PCI-S);  Surgeon: Burnell Blanks, MD;  Location: [facility] CATH LAB;   Service: Cardiovascular;;    Family History  Problem Relation Age of Onset  . Heart attack Father   . CAD Father   . Pancreatic cancer Father   . Emphysema Maternal Grandfather        smoked heavily   . Stomach cancer Paternal Grandmother   . Prostate cancer Paternal Uncle     SOCIAL HISTORY: Social History   Tobacco Use  . Smoking status: Former Smoker    Packs/day: 1.00    Years: 50.00    Pack years: 50.00    Types: Cigarettes    Quit date: 06/01/2012    Years since quitting: 6.9  . Smokeless tobacco: Never Used  Substance Use Topics  . Alcohol use: Yes    Comment: 3 beers daily    No Known Allergies  Current Outpatient Medications  Medication Sig Dispense Refill  . albuterol (PROVENTIL HFA;VENTOLIN HFA) 108 (90 BASE) MCG/ACT inhaler Inhale 2 puffs into the lungs every 6 (six) hours as needed for wheezing or shortness of breath. 3 Inhaler 0  . aspirin 81 MG chewable tablet Chew 1 tablet (81 mg total) by mouth daily. (Patient taking differently: Chew 81 mg by mouth at bedtime. )    . atorvastatin (LIPITOR) 80 MG tablet Take 1 tablet (80 mg total) by mouth at bedtime. 90 tablet 2  . clopidogrel (PLAVIX) 75 MG tablet Take 1 tablet (75 mg total) by mouth at bedtime. 90 tablet 2  . finasteride (PROSCAR) 5 MG tablet Take 5 mg  by mouth daily.    . fluticasone-salmeterol (ADVAIR HFA) 115-21 MCG/ACT inhaler Inhale 2 puffs into the lungs 2 (two) times daily. 3 Inhaler 1  . metoprolol tartrate (LOPRESSOR) 25 MG tablet Take 1 tablet (25 mg total) by mouth 2 (two) times daily. 180 tablet 2  . Multiple Vitamin (MULTIVITAMIN WITH MINERALS) TABS tablet Take 1 tablet by mouth at bedtime. Centrum silver men    . nitroGLYCERIN (NITROSTAT) 0.4 MG SL tablet Place 1 tablet (0.4 mg total) under the tongue every 5 (five) minutes as needed for chest pain. 75 tablet 1  . tamsulosin (FLOMAX) 0.4 MG CAPS Take 0.8 mg by mouth at bedtime.      No current facility-administered medications for this  visit.    REVIEW OF SYSTEMS:  [X]  denotes positive finding, [ ]  denotes negative finding Cardiac  Comments:  Chest pain or chest pressure:    Shortness of breath upon exertion:    Short of breath when lying flat:    Irregular heart rhythm:        Vascular    Pain in calf, thigh, or hip brought on by ambulation:    Pain in feet at night that wakes you up from your sleep:     Blood clot in your veins:    Leg swelling:         Pulmonary    Oxygen at home:    Productive cough:     Wheezing:         Neurologic    Sudden weakness in arms or legs:     Sudden numbness in arms or legs:     Sudden onset of difficulty speaking or slurred speech:    Temporary loss of vision in one eye:     Problems with dizziness:         Gastrointestinal    Blood in stool:     Vomited blood:         Genitourinary    Burning when urinating:     Blood in urine:        Psychiatric    Major depression:         Hematologic    Bleeding problems:    Problems with blood clotting too easily:        Skin    Rashes or ulcers:        Constitutional    Fever or chills:      PHYSICAL EXAM: Vitals:   05/24/19 1014  BP: (!) 151/89  Pulse: 74  Resp: 16  Temp: 97.6 F (36.4 C)  TempSrc: Temporal  SpO2: 95%  Weight: 195 lb (88.5 kg)  Height: 5\' 7"  (1.702 m)    GENERAL: The patient is a well-nourished male, in no acute distress. The vital signs are documented above. CARDIAC: There is a regular rate and rhythm.  VASCULAR:  2+ femoral pulse palpable bilateral grins PULMONARY: There is good air exchange bilaterally without wheezing or rales. ABDOMEN: Soft and non-tender with normal pitched bowel sounds.  No pain with palpation of aneurysm.   DATA:   I independently reviewed CTA abdomen pelvis and on my review there is good seal proximally and distally with no evidence of a type Ia or type Ib endoleak.  Aneurysm has increased from 5.2 to 5.8 cm.  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   Assessment/Plan:  74 yo M s/p aortobiiliac endograft (EVAR) for a 5.2 cm  saccular infrarenal abdominal aortic aneurysm on 11/11/17.  He has evidence of a type II endoleak and now has had growth in the aneurysm of approximately 5 mm since repair.  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  I do not think he is a candidate for a transcaval intervention given he has a saccular aneurysm and most of the outpouching is on the left side of the abdominal aorta.  I have discussed with Dr. Laurence Ferrari with interventional radiology who will review his images and I will refer Juan Lynn to him to see if he has any other options to offer him for intervention.  I will arrange follow-up with me in 6 months for another EVAR duplex and I will follow his progress with Dr. Laurence Ferrari.  In addition I will refer back to [facility] with Dr. Gilford Rile given he is concerned about a complex renal cyst that was noted on the CT scan for surveillance of his endoleak.   Marty Heck, MD [facility] [place] Office: [place]

## 2019-05-25 ENCOUNTER — Other Ambulatory Visit: Payer: Self-pay | Admitting: *Deleted

## 2019-05-25 DIAGNOSIS — I714 Abdominal aortic aneurysm, without rupture, unspecified: Secondary | ICD-10-CM

## 2019-05-25 DIAGNOSIS — S46011A Strain of muscle(s) and tendon(s) of the rotator cuff of right shoulder, initial encounter: Secondary | ICD-10-CM | POA: Diagnosis not present

## 2019-05-25 DIAGNOSIS — M25562 Pain in left knee: Secondary | ICD-10-CM | POA: Diagnosis not present

## 2019-05-26 DIAGNOSIS — C61 Malignant neoplasm of prostate: Secondary | ICD-10-CM | POA: Diagnosis not present

## 2019-05-26 DIAGNOSIS — N281 Cyst of kidney, acquired: Secondary | ICD-10-CM | POA: Diagnosis not present

## 2019-05-27 DIAGNOSIS — S46011A Strain of muscle(s) and tendon(s) of the rotator cuff of right shoulder, initial encounter: Secondary | ICD-10-CM | POA: Diagnosis not present

## 2019-05-27 DIAGNOSIS — M25562 Pain in left knee: Secondary | ICD-10-CM | POA: Diagnosis not present

## 2019-05-31 DIAGNOSIS — S46011A Strain of muscle(s) and tendon(s) of the rotator cuff of right shoulder, initial encounter: Secondary | ICD-10-CM | POA: Diagnosis not present

## 2019-05-31 DIAGNOSIS — M25562 Pain in left knee: Secondary | ICD-10-CM | POA: Diagnosis not present

## 2019-06-01 ENCOUNTER — Ambulatory Visit
Admission: RE | Admit: 2019-06-01 | Discharge: 2019-06-01 | Disposition: A | Discharge status: Home / Self Care | Payer: Medicare Other | Source: Ambulatory Visit | Attending: Vascular Surgery | Admitting: Vascular Surgery

## 2019-06-01 ENCOUNTER — Encounter: Payer: Self-pay | Admitting: *Deleted

## 2019-06-01 ENCOUNTER — Other Ambulatory Visit: Payer: Self-pay

## 2019-06-01 DIAGNOSIS — T82330A Leakage of aortic (bifurcation) graft (replacement), initial encounter: Secondary | ICD-10-CM

## 2019-06-01 DIAGNOSIS — I714 Abdominal aortic aneurysm, without rupture: Secondary | ICD-10-CM | POA: Diagnosis not present

## 2019-06-01 DIAGNOSIS — M25562 Pain in left knee: Secondary | ICD-10-CM | POA: Diagnosis not present

## 2019-06-01 DIAGNOSIS — IMO0002 Reserved for concepts with insufficient information to code with codable children: Secondary | ICD-10-CM

## 2019-06-01 DIAGNOSIS — S46011A Strain of muscle(s) and tendon(s) of the rotator cuff of right shoulder, initial encounter: Secondary | ICD-10-CM | POA: Diagnosis not present

## 2019-06-01 HISTORY — PX: IR RADIOLOGIST EVAL & MGMT: IMG5224

## 2019-06-01 NOTE — Consult Note (Signed)
Chief Complaint: Patient was consulted remotely today (TeleHealth) for  at the request of Clark,Christopher J.    Referring Physician(s): Clark,Christopher J  History of Present Illness: Juan Lynn is a 74 y.o. male with a history of abdominal aortic aneurysm (5.2 cm) which was treated by endovascular aortic repair using a bifurcated endoprosthesis on 11/11/2017.  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  His most recent CT arteriogram from 05/19/2019 demonstrates a persistent endoleak and enlargement of the aneurysm sac to 5.7 cm.  We conferenced today via telemedicine.  Mr. Monjaraz is in his usual state of health.  He denies abdominal pain, flank pain, chest pain or shortness of breath.  We spent a great deal of time discussing the natural history of abdominal aortic aneurysms, endoleak's and the various methods of repair.  I was able to answer all of his questions.  Past Medical History:  Diagnosis Date  . AAA (abdominal aortic aneurysm) ([place])   . BPH (benign prostatic hyperplasia)   . CAD (coronary artery disease) 06/25/2012   a. Inf STEMI s/p cath- DES-mid RCA b. Echo- EF 55%, basal inferior HK  . COPD (chronic obstructive pulmonary disease) ([place])   . OSA (obstructive sleep apnea)   . Tobacco abuse     Past Surgical History:  Procedure Laterality Date  . ABDOMINAL AORTIC ENDOVASCULAR STENT GRAFT N/A 11/11/2017   Procedure: ABDOMINAL AORTIC ENDOVASCULAR STENT GRAFT;  Surgeon: Marty Heck, MD;  Location: [facility];  Service: Vascular;  Laterality: N/A;  . CATARACT EXTRACTION    . CORONARY ANGIOPLASTY WITH STENT PLACEMENT  06/25/2012   100% mid RCA s/p DES, 30% prox AVG Cx, mild luminal irregularities elsewhere; EF 55%  . HERNIA REPAIR    . LEFT HEART CATHETERIZATION WITH CORONARY ANGIOGRAM N/A 06/25/2012   Procedure: LEFT HEART CATHETERIZATION WITH CORONARY ANGIOGRAM;  Surgeon: Burnell Blanks, MD;  Location: [facility] CATH LAB;   Service: Cardiovascular;  Laterality: N/A;  . PERCUTANEOUS CORONARY STENT INTERVENTION (PCI-S)  06/25/2012   Procedure: PERCUTANEOUS CORONARY STENT INTERVENTION (PCI-S);  Surgeon: Burnell Blanks, MD;  Location: [facility] CATH LAB;  Service: Cardiovascular;;    Allergies: Patient has no known allergies.  Medications: Prior to Admission medications   Medication Sig Start Date End Date Taking? Authorizing Provider  albuterol (PROVENTIL HFA;VENTOLIN HFA) 108 (90 BASE) MCG/ACT inhaler Inhale 2 puffs into the lungs every 6 (six) hours as needed for wheezing or shortness of breath. 05/17/14   Tanda Rockers, MD  aspirin 81 MG chewable tablet Chew 1 tablet (81 mg total) by mouth daily. Patient taking differently: Chew 81 mg by mouth at bedtime.  06/27/12   Arguello, Roger A, PA-C  atorvastatin (LIPITOR) 80 MG tablet Take 1 tablet (80 mg total) by mouth at bedtime. 12/15/18   Burnell Blanks, MD  clopidogrel (PLAVIX) 75 MG tablet Take 1 tablet (75 mg total) by mouth at bedtime. 12/15/18   Burnell Blanks, MD  finasteride (PROSCAR) 5 MG tablet Take 5 mg by mouth daily.    [provider]  fluticasone-salmeterol (ADVAIR HFA) 115-21 MCG/ACT inhaler Inhale 2 puffs into the lungs 2 (two) times daily. 06/15/18   Tanda Rockers, MD  metoprolol tartrate (LOPRESSOR) 25 MG tablet Take 1 tablet (25 mg total) by mouth 2 (two) times daily. 12/15/18   Burnell Blanks, MD  Multiple Vitamin (MULTIVITAMIN WITH MINERALS) TABS tablet Take 1 tablet by mouth at bedtime. Centrum silver men  [provider]  nitroGLYCERIN (NITROSTAT) 0.4 MG SL tablet Place 1 tablet (0.4 mg total) under the tongue every 5 (five) minutes as needed for chest pain. 12/15/18   Burnell Blanks, MD  tamsulosin (FLOMAX) 0.4 MG CAPS Take 0.8 mg by mouth at bedtime.     [provider]     Family History  Problem Relation Age of Onset  . Heart attack Father   . CAD Father   . Pancreatic  cancer Father   . Emphysema Maternal Grandfather        smoked heavily   . Stomach cancer Paternal Grandmother   . Prostate cancer Paternal Uncle     Social History   Socioeconomic History  . Marital status: Divorced    Spouse name: Not on file  . Number of children: 2  . Years of education: Not on file  . Highest education level: Not on file  Occupational History  . Occupation: Retired  Tobacco Use  . Smoking status: Former Smoker    Packs/day: 1.00    Years: 50.00    Pack years: 50.00    Types: Cigarettes    Quit date: 06/01/2012    Years since quitting: 7.0  . Smokeless tobacco: Never Used  Substance and Sexual Activity  . Alcohol use: Yes    Comment: 3 beers daily  . Drug use: No  . Sexual activity: Not on file  Other Topics Concern  . Not on file  Social History Narrative   Patient lives with his daughter   Social Determinants of Health   Financial Resource Strain:   . Difficulty of Paying Living Expenses:   Food Insecurity:   . Worried About Charity fundraiser in the Last Year:   . Arboriculturist in the Last Year:   Transportation Needs:   . Film/video editor (Medical):   Marland Kitchen Lack of Transportation (Non-Medical):   Physical Activity:   . Days of Exercise per Week:   . Minutes of Exercise per Session:   Stress:   . Feeling of Stress :   Social Connections:   . Frequency of Communication with Friends and Family:   . Frequency of Social Gatherings with Friends and Family:   . Attends Religious Services:   . Active Member of Clubs or Organizations:   . Attends Archivist Meetings:   Marland Kitchen Marital Status:     Review of Systems  Review of Systems: A 12 point ROS discussed and pertinent positives are indicated in the HPI above.  All other systems are negative.  Physical Exam No direct physical exam was performed (except for noted visual exam findings with Video Visits).   Vital Signs: There were no vitals taken for this  visit.  Imaging: CT ANGIO ABDOMEN PELVIS  W &/OR WO CONTRAST  Result Date: 05/19/2019 CLINICAL DATA:  Follow-up endovascular repair of abdominal aortic aneurysm. Follow-up type 2 endoleak. EXAM: CT ANGIOGRAPHY ABDOMEN AND PELVIS WITH CONTRAST AND WITHOUT CONTRAST TECHNIQUE: Multidetector CT imaging of the abdomen and pelvis was performed using the standard protocol during bolus administration of intravenous contrast. Multiplanar reconstructed images and MIPs were obtained and reviewed to evaluate the vascular anatomy. CONTRAST:  22mL ISOVUE-370 IOPAMIDOL (ISOVUE-370) INJECTION 76% COMPARISON:  12/03/2017 FINDINGS: VASCULAR Aorta: Endovascular repair of the abdominal aortic aneurysm with bifurcated aortic stent graft. Stable position of the stent graft just below the right renal artery origin. Stent graft is patent. The abdominal aortic aneurysm sac swing surrounds the left  side of the abdomen. Maximum diameter of the aneurysm sac is 5.5 x 5.7 cm on sequence 7, image 111 and measured 5.0 x 5.2 cm on a similar level in 2019. There continues to be endoleak with contrast in the aneurysm sac along the right side of the stent. There is contrast in the aneurysm sac at the same level as the accessory left renal artery and endoleak also appears to be associated with a right lumbar artery at the L4 level. Endoleak contrast is also near the origin of the IMA. Celiac: Patent without evidence of aneurysm, dissection, vasculitis or significant stenosis. SMA: Patent without evidence of aneurysm, dissection, vasculitis or significant stenosis. Renals: Single right renal artery is widely patent without aneurysm or dissection. No significant stenosis in the right renal artery. Main left renal artery is patent without significant stenosis. Again noted is an inferior accessory left renal artery which originates from the covered portion of the abdominal aorta. However, there is retrograde flow in this left renal artery that extends  to the aneurysm sac. Suspect this is related to the type 2 endoleak. IMA: Retrograde filling of the IMA and likely associated with the type 2 endoleak. Inflow: Aneurysm terminates at the aortic bifurcation does not extend into the common iliac arteries. Stent grafts extend into the common iliac arteries bilaterally. 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 Mild narrowing at the origin of the left internal iliac artery. Internal and external iliac arteries are patent bilaterally. Proximal Outflow: Proximal femoral arteries are widely patent. Veins: Main portal venous system is patent. IVC and renal veins are patent. Common iliac veins are patent bilaterally. Review of the MIP images confirms the above findings. NON-VASCULAR Lower chest: Paraseptal emphysema in the lower lungs. No large pleural effusions. Hepatobiliary: Normal appearance of the liver and gallbladder. Pancreas: Unremarkable. No pancreatic ductal dilatation or surrounding inflammatory changes. Spleen: Normal in size without focal abnormality. Adrenals/Urinary Tract: 2 mm stone in the left kidney lower pole. Stable peripherally calcified low-density structure in the medial right kidney measures up to 2.0 cm. There is no definite enhancement in the central aspect of this structure and suspect this represents a complex renal cyst with calcifications. Delayed enhancement of the left kidney lower pole related to the accessory artery. Minimal stranding along the left kidney lower pole. 2.0 cm cyst in the lateral right kidney. No suspicious renal lesions. No hydronephrosis. Normal appearance of the urinary bladder. Stomach/Bowel: Normal appendix. Colonic diverticulosis without acute inflammation. Normal appearance of the stomach and duodenum. Negative for bowel obstruction or focal bowel inflammation. Lymphatic: No abdominopelvic lymphadenopathy. Reproductive: Enlarged prostate, measuring 5.8 cm in the transverse  dimension. Other: Bilateral inguinal hernias containing fat, left side greater than right. Umbilical hernia containing fat. Musculoskeletal: Disc space narrowing at L5-S1. IMPRESSION: VASCULAR 1. Endovascular repair of the abdominal aortic aneurysm with an enlarging aortic aneurysm sac. The aneurysm sac now measures up to 5.7 cm and previously measured 5.2 cm in 2019. Evidence for type 2 endoleak associated with the IMA, accessory left renal artery and a right lumbar artery. NON-VASCULAR 1. No acute abnormality in the abdomen or pelvis. 2. Nonobstructive left kidney stone. 3. Stable structure in the right kidney with peripheral calcifications and no significant enhancement. This likely represents a complex cyst. 4. Inguinal hernias and umbilical hernia. 5. Prostate enlargement. 6.  Emphysema (ICD10-J43.9). Electronically Signed   By: Markus Daft M.D.   On: 05/19/2019 12:03    Labs:  CBC:  No results for input(s): WBC, HGB, HCT, PLT in the last 8760 hours.  COAGS: No results for input(s): INR, APTT in the last 8760 hours.  BMP: No results for input(s): NA, K, CL, CO2, GLUCOSE, BUN, CALCIUM, CREATININE, GFRNONAA, GFRAA in the last 8760 hours.  Invalid input(s): CMP  LIVER FUNCTION TESTS: No results for input(s): BILITOT, AST, ALT, ALKPHOS, PROT, ALBUMIN in the last 8760 hours.  TUMOR MARKERS: No results for input(s): AFPTM, CEA, CA199, CHROMGRNA in the last 8760 hours.  Assessment and Plan:  Very pleasant 74 year old gentleman with an enlarging aneurysm sac secondary to a type II endoleak which appears to be arising from the right L4 lumbar artery and an inferior accessory left renal artery.  Based on his imaging, I believe there is a pathway from the right hypogastric artery up the iliolumbar collateral chain to the right L4 lumbar artery which should allow for an endovascular treatment of his type II endoleak.  We discussed this endovascular approach in detail.  We also discussed that a  direct sac puncture under general anesthesia would be a Plan B option should we be unsuccessful on the endovascular attempt.  Mr. Theurer understands the options and agrees to proceed with attempted endovascular repair of his type II endoleak.  1.)  Please schedule for endovascular repair of type II endoleak to be performed at [facility].  Patient requests scheduling the procedure in late April or early May.  Procedure will be performed under moderate sedation and we will plan to use Onyx liquid embolic.  Thank you for this interesting consult.  I greatly enjoyed meeting Naden Doolen and look forward to participating in their care.  A copy of this report was sent to the requesting provider on this date.  Electronically Signed: Jacqulynn Cadet 06/01/2019, 10:02 AM   I spent a total of 30 Minutes  in remote  clinical consultation, greater than 50% of which was counseling/coordinating care for type 2 endoleak.    Visit type: Audio only (telephone). Audio (no video) only due to patient preference. Alternative for in-person consultation at [facility], [address] [place], [place], [place]. This visit type was conducted due to national recommendations for restrictions regarding the COVID-19 Pandemic (e.g. social distancing).  This format is felt to be most appropriate for this patient at this time.  All issues noted in this document were discussed and addressed.

## 2019-06-07 ENCOUNTER — Other Ambulatory Visit (HOSPITAL_COMMUNITY): Payer: Self-pay | Admitting: Interventional Radiology

## 2019-06-07 DIAGNOSIS — I714 Abdominal aortic aneurysm, without rupture, unspecified: Secondary | ICD-10-CM

## 2019-06-08 DIAGNOSIS — M25562 Pain in left knee: Secondary | ICD-10-CM | POA: Diagnosis not present

## 2019-06-08 DIAGNOSIS — S46011A Strain of muscle(s) and tendon(s) of the rotator cuff of right shoulder, initial encounter: Secondary | ICD-10-CM | POA: Diagnosis not present

## 2019-06-29 ENCOUNTER — Other Ambulatory Visit: Payer: Self-pay | Admitting: Radiology

## 2019-06-29 ENCOUNTER — Other Ambulatory Visit: Payer: Self-pay | Admitting: Physician Assistant

## 2019-06-30 ENCOUNTER — Other Ambulatory Visit: Payer: Self-pay

## 2019-06-30 ENCOUNTER — Ambulatory Visit (HOSPITAL_COMMUNITY)
Admission: RE | Admit: 2019-06-30 | Discharge: 2019-06-30 | Disposition: A | Discharge status: Home / Self Care | Payer: Medicare Other | Source: Ambulatory Visit | Attending: Interventional Radiology | Admitting: Interventional Radiology

## 2019-06-30 ENCOUNTER — Other Ambulatory Visit (HOSPITAL_COMMUNITY): Payer: Self-pay | Admitting: Interventional Radiology

## 2019-06-30 ENCOUNTER — Encounter (HOSPITAL_COMMUNITY): Payer: Self-pay

## 2019-06-30 DIAGNOSIS — G4733 Obstructive sleep apnea (adult) (pediatric): Secondary | ICD-10-CM | POA: Insufficient documentation

## 2019-06-30 DIAGNOSIS — I714 Abdominal aortic aneurysm, without rupture, unspecified: Secondary | ICD-10-CM

## 2019-06-30 DIAGNOSIS — T82330A Leakage of aortic (bifurcation) graft (replacement), initial encounter: Secondary | ICD-10-CM | POA: Insufficient documentation

## 2019-06-30 DIAGNOSIS — Z79899 Other long term (current) drug therapy: Secondary | ICD-10-CM | POA: Diagnosis not present

## 2019-06-30 DIAGNOSIS — T82898A Other specified complication of vascular prosthetic devices, implants and grafts, initial encounter: Secondary | ICD-10-CM | POA: Diagnosis not present

## 2019-06-30 DIAGNOSIS — Y839 Surgical procedure, unspecified as the cause of abnormal reaction of the patient, or of later complication, without mention of misadventure at the time of the procedure: Secondary | ICD-10-CM | POA: Diagnosis not present

## 2019-06-30 DIAGNOSIS — Z87891 Personal history of nicotine dependence: Secondary | ICD-10-CM | POA: Diagnosis not present

## 2019-06-30 DIAGNOSIS — Z7982 Long term (current) use of aspirin: Secondary | ICD-10-CM | POA: Diagnosis not present

## 2019-06-30 DIAGNOSIS — I252 Old myocardial infarction: Secondary | ICD-10-CM | POA: Insufficient documentation

## 2019-06-30 DIAGNOSIS — I2510 Atherosclerotic heart disease of native coronary artery without angina pectoris: Secondary | ICD-10-CM | POA: Diagnosis not present

## 2019-06-30 DIAGNOSIS — Z7902 Long term (current) use of antithrombotics/antiplatelets: Secondary | ICD-10-CM | POA: Diagnosis not present

## 2019-06-30 DIAGNOSIS — Z955 Presence of coronary angioplasty implant and graft: Secondary | ICD-10-CM | POA: Insufficient documentation

## 2019-06-30 DIAGNOSIS — N400 Enlarged prostate without lower urinary tract symptoms: Secondary | ICD-10-CM | POA: Insufficient documentation

## 2019-06-30 DIAGNOSIS — J449 Chronic obstructive pulmonary disease, unspecified: Secondary | ICD-10-CM | POA: Diagnosis not present

## 2019-06-30 HISTORY — PX: IR US GUIDE VASC ACCESS RIGHT: IMG2390

## 2019-06-30 HISTORY — PX: IR ANGIOGRAM VISCERAL SELECTIVE: IMG657

## 2019-06-30 HISTORY — PX: IR EMBO ARTERIAL NOT HEMORR HEMANG INC GUIDE ROADMAPPING: IMG5448

## 2019-06-30 HISTORY — PX: IR ANGIOGRAM SELECTIVE EACH ADDITIONAL VESSEL: IMG667

## 2019-06-30 HISTORY — PX: IR RENAL SELECTIVE  UNI INC S&I MOD SED: IMG654

## 2019-06-30 HISTORY — PX: IR ANGIOGRAM EXTREMITY RIGHT: IMG652

## 2019-06-30 HISTORY — PX: IR ANGIOGRAM PELVIS SELECTIVE OR SUPRASELECTIVE: IMG661

## 2019-06-30 LAB — CBC
HCT: 50 % (ref 39.0–52.0)
Hemoglobin: 17.1 g/dL — ABNORMAL HIGH (ref 13.0–17.0)
MCH: 33.5 pg (ref 26.0–34.0)
MCHC: 34.2 g/dL (ref 30.0–36.0)
MCV: 97.8 fL (ref 80.0–100.0)
Platelets: 142 10*3/uL — ABNORMAL LOW (ref 150–400)
RBC: 5.11 MIL/uL (ref 4.22–5.81)
RDW: 11.9 % (ref 11.5–15.5)
WBC: 7 10*3/uL (ref 4.0–10.5)
nRBC: 0 % (ref 0.0–0.2)

## 2019-06-30 LAB — PROTIME-INR
INR: 1 (ref 0.8–1.2)
Prothrombin Time: 12.5 seconds (ref 11.4–15.2)

## 2019-06-30 LAB — BASIC METABOLIC PANEL
Anion gap: 12 (ref 5–15)
BUN: 24 mg/dL — ABNORMAL HIGH (ref 8–23)
CO2: 22 mmol/L (ref 22–32)
Calcium: 9.2 mg/dL (ref 8.9–10.3)
Chloride: 105 mmol/L (ref 98–111)
Creatinine, Ser: 1.25 mg/dL — ABNORMAL HIGH (ref 0.61–1.24)
GFR calc Af Amer: >60 mL/min (ref >60)
GFR calc non Af Amer: 57 mL/min — ABNORMAL LOW (ref >60)
Glucose, Bld: 112 mg/dL — ABNORMAL HIGH (ref 70–99)
Potassium: 4.1 mmol/L (ref 3.5–5.1)
Sodium: 139 mmol/L (ref 135–145)

## 2019-06-30 MED ORDER — FENTANYL CITRATE (PF) 100 MCG/2ML IJ SOLN
INTRAMUSCULAR | Status: AC
  Filled 2019-06-30: qty 2

## 2019-06-30 MED ORDER — LIDOCAINE HCL 1 % IJ SOLN
INTRAMUSCULAR | Status: AC
  Filled 2019-06-30: qty 20

## 2019-06-30 MED ORDER — SODIUM CHLORIDE 0.9 % IV SOLN: INTRAVENOUS | Status: DC

## 2019-06-30 MED ORDER — HYDROCODONE-ACETAMINOPHEN 5-325 MG PO TABS: 1.0000 | ORAL_TABLET | ORAL | Status: DC | PRN

## 2019-06-30 MED ORDER — LIDOCAINE HCL 1 % IJ SOLN
INTRAMUSCULAR | Status: AC | PRN
  Administered 2019-06-30: 10 mL

## 2019-06-30 MED ORDER — MIDAZOLAM HCL 2 MG/2ML IJ SOLN
INTRAMUSCULAR | Status: AC | PRN
  Administered 2019-06-30: 0.5 mg via INTRAVENOUS
  Administered 2019-06-30: 1 mg via INTRAVENOUS

## 2019-06-30 MED ORDER — FENTANYL CITRATE (PF) 100 MCG/2ML IJ SOLN
INTRAMUSCULAR | Status: AC | PRN
  Administered 2019-06-30: 50 ug via INTRAVENOUS
  Administered 2019-06-30: 25 ug via INTRAVENOUS

## 2019-06-30 MED ORDER — IOHEXOL 300 MG/ML  SOLN: 150.0000 mL | Freq: Once | INTRAMUSCULAR | Status: DC | PRN

## 2019-06-30 MED ORDER — MIDAZOLAM HCL 2 MG/2ML IJ SOLN
INTRAMUSCULAR | Status: AC
  Filled 2019-06-30: qty 2

## 2019-06-30 MED ORDER — HEPARIN SODIUM (PORCINE) 1000 UNIT/ML IJ SOLN
INTRAMUSCULAR | Status: AC
  Filled 2019-06-30: qty 1

## 2019-06-30 NOTE — Procedures (Signed)
Interventional Radiology Procedure Note  Procedure: Endovascular repair of Type 2 endoelak.   Complications: None  Estimated Blood Loss: None  Recommendations: - Bedrest x 3 hrs - DC home   Signed,  Criselda Peaches, MD

## 2019-06-30 NOTE — H&P (Signed)
Chief Complaint: Patient was seen in consultation today for endoleak repair.  Referring Physician(s): Monica Martinez  Supervising Physician: Jacqulynn Cadet  Patient Status: [facility] - Out-pt  History of Present Illness: Juan Lynn is a 74 y.o. male with a past medical history significant for BPH, OSA,CAD, COPD and AAA s/p endovascular aortic repair 11/11/17 who presents today for type II endoleak repair - he was previously seen in consultation with Dr. Laurence Ferrari on 3/31, please see this note for full details. Briefly, Juan Lynn was found to have a persistent endoleak and enlargement of the aneurysm sac on CTA 05/19/19 and he was referred to IR to discuss possible treatment options. After thorough discussion he has decided to proceed with image guide endovascular repair.   Juan Lynn states he has been feeling well, no complaints. He is curious about how the puncture site will be closed because when he had surgery before he had some glue that he was not aware would be there. He states understanding of the requested procedure and wishes to proceed as planned.  Past Medical History:  Diagnosis Date  . AAA (abdominal aortic aneurysm) ([place])   . BPH (benign prostatic hyperplasia)   . CAD (coronary artery disease) 06/25/2012   a. Inf STEMI s/p cath- DES-mid RCA b. Echo- EF 55%, basal inferior HK  . COPD (chronic obstructive pulmonary disease) ([place])   . OSA (obstructive sleep apnea)   . Tobacco abuse     Past Surgical History:  Procedure Laterality Date  . ABDOMINAL AORTIC ENDOVASCULAR STENT GRAFT N/A 11/11/2017   Procedure: ABDOMINAL AORTIC ENDOVASCULAR STENT GRAFT;  Surgeon: Marty Heck, MD;  Location: [facility];  Service: Vascular;  Laterality: N/A;  . CATARACT EXTRACTION    . CORONARY ANGIOPLASTY WITH STENT PLACEMENT  06/25/2012   100% mid RCA s/p DES, 30% prox AVG Cx, mild luminal irregularities elsewhere; EF 55%  . HERNIA REPAIR    . IR RADIOLOGIST EVAL & MGMT   06/01/2019  . LEFT HEART CATHETERIZATION WITH CORONARY ANGIOGRAM N/A 06/25/2012   Procedure: LEFT HEART CATHETERIZATION WITH CORONARY ANGIOGRAM;  Surgeon: Burnell Blanks, MD;  Location: [facility] CATH LAB;  Service: Cardiovascular;  Laterality: N/A;  . PERCUTANEOUS CORONARY STENT INTERVENTION (PCI-S)  06/25/2012   Procedure: PERCUTANEOUS CORONARY STENT INTERVENTION (PCI-S);  Surgeon: Burnell Blanks, MD;  Location: [facility] CATH LAB;  Service: Cardiovascular;;    Allergies: Patient has no known allergies.  Medications: Prior to Admission medications   Medication Sig Start Date End Date Taking? Authorizing Provider  aspirin EC 81 MG tablet Take 81 mg by mouth at bedtime.   Yes [provider]  atorvastatin (LIPITOR) 80 MG tablet Take 1 tablet (80 mg total) by mouth at bedtime. 12/15/18  Yes Burnell Blanks, MD  clopidogrel (PLAVIX) 75 MG tablet Take 1 tablet (75 mg total) by mouth at bedtime. 12/15/18  Yes Burnell Blanks, MD  finasteride (PROSCAR) 5 MG tablet Take 5 mg by mouth at bedtime.    Yes [provider]  metoprolol tartrate (LOPRESSOR) 25 MG tablet Take 1 tablet (25 mg total) by mouth 2 (two) times daily. Patient taking differently: Take 50 mg by mouth at bedtime.  12/15/18  Yes Burnell Blanks, MD  Multiple Vitamin (MULTIVITAMIN WITH MINERALS) TABS tablet Take 1 tablet by mouth at bedtime. Centrum silver men   Yes [provider]  nitroGLYCERIN (NITROSTAT) 0.4 MG SL tablet Place 1 tablet (0.4 mg total) under the tongue every 5 (five) minutes as needed for  chest pain. 12/15/18  Yes Burnell Blanks, MD  tamsulosin (FLOMAX) 0.4 MG CAPS Take 0.8 mg by mouth at bedtime.    Yes [provider]  Fluticasone-Salmeterol (ADVAIR DISKUS) 100-50 MCG/DOSE AEPB Inhale 1 puff into the lungs 2 (two) times daily as needed (shortness).    [provider]     Family History  Problem Relation Age of Onset  . Heart attack  Father   . CAD Father   . Pancreatic cancer Father   . Emphysema Maternal Grandfather        smoked heavily   . Stomach cancer Paternal Grandmother   . Prostate cancer Paternal Uncle     Social History   Socioeconomic History  . Marital status: Divorced    Spouse name: Not on file  . Number of children: 2  . Years of education: Not on file  . Highest education level: Not on file  Occupational History  . Occupation: Retired  Tobacco Use  . Smoking status: Former Smoker    Packs/day: 1.00    Years: 50.00    Pack years: 50.00    Types: Cigarettes    Quit date: 06/01/2012    Years since quitting: 7.0  . Smokeless tobacco: Never Used  Substance and Sexual Activity  . Alcohol use: Yes    Comment: 3 beers daily  . Drug use: No  . Sexual activity: Not on file  Other Topics Concern  . Not on file  Social History Narrative   Patient lives with his daughter   Social Determinants of Health   Financial Resource Strain:   . Difficulty of Paying Living Expenses:   Food Insecurity:   . Worried About Charity fundraiser in the Last Year:   . Arboriculturist in the Last Year:   Transportation Needs:   . Film/video editor (Medical):   Marland Kitchen Lack of Transportation (Non-Medical):   Physical Activity:   . Days of Exercise per Week:   . Minutes of Exercise per Session:   Stress:   . Feeling of Stress :   Social Connections:   . Frequency of Communication with Friends and Family:   . Frequency of Social Gatherings with Friends and Family:   . Attends Religious Services:   . Active Member of Clubs or Organizations:   . Attends Archivist Meetings:   Marland Kitchen Marital Status:      Review of Systems: A 12 point ROS discussed and pertinent positives are indicated in the HPI above.  All other systems are negative.  Review of Systems  Constitutional: Negative for chills and fever.  HENT: Negative for nosebleeds.   Respiratory: Negative for cough and shortness of breath.     Cardiovascular: Negative for chest pain.  Gastrointestinal: Negative for abdominal pain, blood in stool, diarrhea, nausea and vomiting.  Genitourinary: Negative for hematuria.  Musculoskeletal: Negative for back pain.  Neurological: Negative for dizziness and headaches.    Vital Signs: BP (!) 143/81   Temp 98.2 F (36.8 C) (Oral)   Resp 18   Ht 5' 7.5" (1.715 m)   Wt 195 lb (88.5 kg)   SpO2 95%   BMI 30.09 kg/m   Physical Exam Vitals reviewed.  Constitutional:      General: He is not in acute distress. HENT:     Head: Normocephalic.     Mouth/Throat:     Mouth: Mucous membranes are moist.     Pharynx: Oropharynx is clear. No oropharyngeal exudate  or posterior oropharyngeal erythema.  Cardiovascular:     Rate and Rhythm: Normal rate and regular rhythm.  Pulmonary:     Effort: Pulmonary effort is normal.     Breath sounds: Normal breath sounds.  Abdominal:     General: There is no distension.     Palpations: Abdomen is soft.     Tenderness: There is no abdominal tenderness.  Skin:    General: Skin is warm and dry.  Neurological:     Mental Status: He is alert and oriented to person, place, and time.  Psychiatric:        Mood and Affect: Mood normal.        Behavior: Behavior normal.        Thought Content: Thought content normal.        Judgment: Judgment normal.      MD Evaluation Airway: WNL Heart: WNL Abdomen: WNL Chest/ Lungs: WNL ASA  Classification: 2 Mallampati/Airway Score: Two   Imaging: IR Radiologist Eval & Mgmt  Result Date: 06/01/2019 Please refer to notes tab for details about interventional procedure. (Op Note)   Labs:  CBC: Recent Labs    06/30/19 0655  WBC 7.0  HGB 17.1*  HCT 50.0  PLT 142*    COAGS: Recent Labs    06/30/19 0655  INR 1.0    BMP: Recent Labs    06/30/19 0655  NA 139  K 4.1  CL 105  CO2 22  GLUCOSE 112*  BUN 24*  CALCIUM 9.2  CREATININE 1.25*  GFRNONAA 14*  GFRAA >60    Assessment and  Plan:  74 y/o M with history of AAA s/p repair 11/11/17 found to have persistent type II endoleak with aneurysm sac enlargement on recent CTA. He was referred to IR for further evaluation and has decided to proceed with enodvascular repair.  Patient has been NPO since 9 pm, last dose of Plavix 4/28. Afebrile, WBC 7.0, hgb 17.1, plt 142, creatinine 1.25, INR 1.0.  Risks and benefits of arteriogram with intervention were discussed with the patient including, but not limited to bleeding, infection, vascular injury, contrast induced renal failure, stroke, reperfusion hemorrhage, or even death. This interventional procedure involves the use of X-rays and because of the nature of the planned procedure, it is possible that we will have prolonged use of X-ray fluoroscopy.  Potential radiation risks to you include (but are not limited to) the following: - A slightly elevated risk for cancer  several years later in life. This risk is typically less than 0.5% percent. This risk is low in comparison to the normal incidence of human cancer, which is 33% for women and 50% for men according to the [place]. - Radiation induced injury can include skin redness, resembling a rash, tissue breakdown / ulcers and hair loss (which can be temporary or permanent).   The likelihood of either of these occurring depends on the difficulty of the procedure and whether you are sensitive to radiation due to previous procedures, disease, or genetic conditions.  IF your procedure requires a prolonged use of radiation, you will be notified and given written instructions for further action.  It is your responsibility to monitor the irradiated area for the 2 weeks following the procedure and to notify your physician if you are concerned that you have suffered a radiation induced injury.    All of the patient's questions were answered, patient is agreeable to proceed.  Consent signed and in chart.  Thank you for this  interesting consult.  I greatly enjoyed meeting Carol Bellus and look forward to participating in their care.  A copy of this report was sent to the requesting provider on this date.  Electronically Signed: Joaquim Nam, PA-C 06/30/2019, 7:28 AM   I spent a total of  15 Minutes in face to face in clinical consultation, greater than 50% of which was counseling/coordinating care for type II endoleak repair.

## 2019-06-30 NOTE — Discharge Instructions (Addendum)
Moderate Conscious Sedation, Adult Sedation is the use of medicines to promote relaxation and relieve discomfort and anxiety. Moderate conscious sedation is a type of sedation. Under moderate conscious sedation, you are less alert than normal, but you are still able to respond to instructions, touch, or both. Moderate conscious sedation is used during short medical and dental procedures. It is milder than deep sedation, which is a type of sedation under which you cannot be easily woken up. It is also milder than general anesthesia, which is the use of medicines to make you unconscious. Moderate conscious sedation allows you to return to your regular activities sooner. Tell a health care provider about:  Any allergies you have.  All medicines you are taking, including vitamins, herbs, eye drops, creams, and over-the-counter medicines.  Use of steroids (by mouth or creams).  Any problems you or family members have had with sedatives and anesthetic medicines.  Any blood disorders you have.  Any surgeries you have had.  Any medical conditions you have, such as sleep apnea.  Whether you are pregnant or may be pregnant.  Any use of cigarettes, alcohol, marijuana, or street drugs. What are the risks? Generally, this is a safe procedure. However, problems may occur, including:  Getting too much medicine (oversedation).  Nausea.  Allergic reaction to medicines.  Trouble breathing. If this happens, a breathing tube may be used to help with breathing. It will be removed when you are awake and breathing on your own.  Heart trouble.  Lung trouble. What happens before the procedure? Staying hydrated Follow instructions from your health care provider about hydration, which may include:  Up to 2 hours before the procedure - you may continue to drink clear liquids, such as water, clear fruit juice, black coffee, and plain tea. Eating and drinking restrictions Follow instructions from your  health care provider about eating and drinking, which may include:  8 hours before the procedure - stop eating heavy meals or foods such as meat, fried foods, or fatty foods.  6 hours before the procedure - stop eating light meals or foods, such as toast or cereal.  6 hours before the procedure - stop drinking milk or drinks that contain milk.  2 hours before the procedure - stop drinking clear liquids. Medicine Ask your health care provider about:  Changing or stopping your regular medicines. This is especially important if you are taking diabetes medicines or blood thinners.  Taking medicines such as aspirin and ibuprofen. These medicines can thin your blood. Do not take these medicines before your procedure if your health care provider instructs you not to.  Tests and exams  You will have a physical exam.  You may have blood tests done to show: ? How well your kidneys and liver are working. ? How well your blood can clot. General instructions  Plan to have someone take you home from the hospital or clinic.  If you will be going home right after the procedure, plan to have someone with you for 24 hours. What happens during the procedure?  An IV tube will be inserted into one of your veins.  Medicine to help you relax (sedative) will be given through the IV tube.  The medical or dental procedure will be performed. What happens after the procedure?  Your blood pressure, heart rate, breathing rate, and blood oxygen level will be monitored often until the medicines you were given have worn off.  Do not drive for 24 hours. This information is not   intended to replace advice given to you by your health care provider. Make sure you discuss any questions you have with your health care provider. Document Revised: 01/30/2017 Document Reviewed: 06/09/2015 Elsevier Patient Education  [place] After This sheet gives you information about how to care for  yourself after your procedure. Your doctor may also give you more specific instructions. If you have problems or questions, contact your doctor. Follow these instructions at home: Insertion site care  Follow instructions from your doctor about how to take care of your long, thin tube (catheter) insertion area. Make sure you: ? Wash your hands with soap and water before you change your bandage (dressing). If you cannot use soap and water, use hand sanitizer. ? Change your bandage as told by your doctor. ? Leave stitches (sutures), skin glue, or skin tape (adhesive) strips in place. They may need to stay in place for 2 weeks or longer. If tape strips get loose and curl up, you may trim the loose edges. Do not remove tape strips completely unless your doctor says it is okay.  Do not take baths, swim, or use a hot tub until your doctor says it is okay.  You may shower 24-48 hours after the procedure or as told by your doctor. ? Gently wash the area with plain soap and water. ? Pat the area dry with a clean towel. ? Do not rub the area. This may cause bleeding.  Do not apply powder or lotion to the area. Keep the area clean and dry.  Check your insertion area every day for signs of infection. Check for: ? More redness, swelling, or pain. ? Fluid or blood. ? Warmth. ? Pus or a bad smell. Activity  Rest as told by your doctor, usually for 1-2 days.  Do not lift anything that is heavier than 10 lbs. (4.5 kg) or as told by your doctor.  Do not drive for 24 hours if you were given a medicine to help you relax (sedative).  Do not drive or use heavy machinery while taking prescription pain medicine. General instructions   Go back to your normal activities as told by your doctor, usually in about a week. Ask your doctor what activities are safe for you.  If the insertion area starts to bleed, lie flat and put pressure on the area. If the bleeding does not stop, get help right away. This is an  emergency.  Drink enough fluid to keep your pee (urine) clear or pale yellow.  Take over-the-counter and prescription medicines only as told by your doctor.  Keep all follow-up visits as told by your doctor. This is important. Contact a doctor if:  You have a fever.  You have chills.  You have more redness, swelling, or pain around your insertion area.  You have fluid or blood coming from your insertion area.  The insertion area feels warm to the touch.  You have pus or a bad smell coming from your insertion area.  You have more bruising around the insertion area.  Blood collects in the tissue around the insertion area (hematoma) that may be painful to the touch. Get help right away if:  You have a lot of pain in the insertion area.  The insertion area swells very fast.  The insertion area is bleeding, and the bleeding does not stop after holding steady pressure on the area.  The area near or just beyond the insertion area becomes pale, cool, tingly, or  numb. These symptoms may be an emergency. Do not wait to see if the symptoms will go away. Get medical help right away. Call your local emergency services (911 in the [place] Do not drive yourself to the hospital. Summary  After the procedure, it is common to have bruising and tenderness at the long, thin tube insertion area.  After the procedure, it is important to rest and drink plenty of fluids.  Do not take baths, swim, or use a hot tub until your doctor says it is okay to do so. You may shower 24-48 hours after the procedure or as told by your doctor.  If the insertion area starts to bleed, lie flat and put pressure on the area. If the bleeding does not stop, get help right away. This is an emergency. This information is not intended to replace advice given to you by your health care provider. Make sure you discuss any questions you have with your health care provider. Document Revised: 01/30/2017 Document Reviewed:  02/12/2016 Elsevier Patient Education  2020 [employer].

## 2019-07-01 ENCOUNTER — Other Ambulatory Visit (HOSPITAL_COMMUNITY): Payer: Self-pay | Admitting: Interventional Radiology

## 2019-07-01 ENCOUNTER — Encounter (HOSPITAL_COMMUNITY): Payer: Self-pay

## 2019-07-01 DIAGNOSIS — I714 Abdominal aortic aneurysm, without rupture, unspecified: Secondary | ICD-10-CM

## 2019-07-15 ENCOUNTER — Other Ambulatory Visit: Payer: Self-pay | Admitting: Interventional Radiology

## 2019-07-15 DIAGNOSIS — IMO0002 Reserved for concepts with insufficient information to code with codable children: Secondary | ICD-10-CM

## 2019-07-22 DIAGNOSIS — C61 Malignant neoplasm of prostate: Secondary | ICD-10-CM | POA: Diagnosis not present

## 2019-07-25 ENCOUNTER — Other Ambulatory Visit: Payer: Self-pay | Admitting: Urology

## 2019-07-25 DIAGNOSIS — C61 Malignant neoplasm of prostate: Secondary | ICD-10-CM

## 2019-07-28 ENCOUNTER — Other Ambulatory Visit: Payer: Self-pay

## 2019-07-28 ENCOUNTER — Ambulatory Visit
Admission: RE | Admit: 2019-07-28 | Discharge: 2019-07-28 | Disposition: A | Discharge status: Home / Self Care | Payer: Medicare Other | Source: Ambulatory Visit | Attending: Interventional Radiology | Admitting: Interventional Radiology

## 2019-07-28 ENCOUNTER — Encounter: Payer: Self-pay | Admitting: *Deleted

## 2019-07-28 DIAGNOSIS — IMO0002 Reserved for concepts with insufficient information to code with codable children: Secondary | ICD-10-CM

## 2019-07-28 DIAGNOSIS — T82330D Leakage of aortic (bifurcation) graft (replacement), subsequent encounter: Secondary | ICD-10-CM | POA: Diagnosis not present

## 2019-07-28 HISTORY — PX: IR RADIOLOGIST EVAL & MGMT: IMG5224

## 2019-07-28 NOTE — Progress Notes (Signed)
Chief Complaint: Patient was consulted remotely today (TeleHealth) for type 2 endoleak at the request of [place].    Referring Physician(s): Monica Martinez, MD  History of Present Illness: Juan Lynn is a 74 y.o. male with a past medical history significant for BPH, OSA,CAD, COPD and AAA s/p endovascular aortic repair 11/11/17 who presents today for type II endoleak repair - he was previously seen in consultation with Dr. Laurence Ferrari on 3/31, please see this note for full details. Briefly, Juan Lynn was found to have a persistent endoleak and enlargement of the aneurysm sac on CTA 05/19/19 and he was referred to IR to discuss possible treatment options. After thorough discussion he has decided to proceed with image guide endovascular repair.   Juan Lynn underwent endovascular repair On 07/01/2019.  He was found to have inflow via the right L4 lumbar artery and outflow via an accessory artery to the lower pole of the left kidney.  His procedure was a success with coil embolization of both the outflow and inflow arteries as well as liquid embolic embolization of the Endosac itself.  Today we meet over the phone.  His postprocedural course has been uncomplicated.  He has no abdominal pain, back pain or groin pain.  His recovery was quite easy and he is pleased by that.  He has no new systemic symptoms at this time.  Past Medical History:  Diagnosis Date  . AAA (abdominal aortic aneurysm) ([place])   . BPH (benign prostatic hyperplasia)   . CAD (coronary artery disease) 06/25/2012   a. Inf STEMI s/p cath- DES-mid RCA b. Echo- EF 55%, basal inferior HK  . COPD (chronic obstructive pulmonary disease) ([place])   . OSA (obstructive sleep apnea)   . Tobacco abuse     Past Surgical History:  Procedure Laterality Date  . ABDOMINAL AORTIC ENDOVASCULAR STENT GRAFT N/A 11/11/2017   Procedure: ABDOMINAL AORTIC ENDOVASCULAR STENT GRAFT;  Surgeon: Marty Heck, MD;  Location: [facility];  Service: Vascular;  Laterality: N/A;  . CATARACT EXTRACTION    . CORONARY ANGIOPLASTY WITH STENT PLACEMENT  06/25/2012   100% mid RCA s/p DES, 30% prox AVG Cx, mild luminal irregularities elsewhere; EF 55%  . HERNIA REPAIR    . IR ANGIOGRAM EXTREMITY RIGHT  06/30/2019  . IR ANGIOGRAM PELVIS SELECTIVE OR SUPRASELECTIVE  06/30/2019  . IR ANGIOGRAM SELECTIVE EACH ADDITIONAL VESSEL  06/30/2019  . IR ANGIOGRAM SELECTIVE EACH ADDITIONAL VESSEL  06/30/2019  . IR ANGIOGRAM SELECTIVE EACH ADDITIONAL VESSEL  06/30/2019  . IR ANGIOGRAM VISCERAL SELECTIVE  06/30/2019  . IR ANGIOGRAM VISCERAL SELECTIVE  06/30/2019  . IR EMBO ARTERIAL NOT HEMORR HEMANG INC GUIDE ROADMAPPING  06/30/2019  . IR RADIOLOGIST EVAL & MGMT  06/01/2019  . IR RENAL SELECTIVE  UNI INC S&I MOD SED  06/30/2019  . IR US GUIDE VASC ACCESS RIGHT  06/30/2019  . LEFT HEART CATHETERIZATION WITH CORONARY ANGIOGRAM N/A 06/25/2012   Procedure: LEFT HEART CATHETERIZATION WITH CORONARY ANGIOGRAM;  Surgeon: Burnell Blanks, MD;  Location: [facility] CATH LAB;  Service: Cardiovascular;  Laterality: N/A;  . PERCUTANEOUS CORONARY STENT INTERVENTION (PCI-S)  06/25/2012   Procedure: PERCUTANEOUS CORONARY STENT INTERVENTION (PCI-S);  Surgeon: Burnell Blanks, MD;  Location: [facility] C[facility] LAB;  Service: Cardiovascular;;    Allergies: Patient has no known allergies.  Medications: Prior to Admission medications   Medication Sig Start Date End Date Taking? Authorizing Provider  aspirin EC 81 MG tablet Take 81 mg by mouth at bedtime.  [provider]  atorvastatin (LIPITOR) 80 MG tablet Take 1 tablet (80 mg total) by mouth at bedtime. 12/15/18   Burnell Blanks, MD  clopidogrel (PLAVIX) 75 MG tablet Take 1 tablet (75 mg total) by mouth at bedtime. 12/15/18   Burnell Blanks, MD  finasteride (PROSCAR) 5 MG tablet Take 5 mg by mouth at bedtime.     [provider]  Fluticasone-Salmeterol (ADVAIR DISKUS) 100-50 MCG/DOSE AEPB  Inhale 1 puff into the lungs 2 (two) times daily as needed (shortness).    [provider]  metoprolol tartrate (LOPRESSOR) 25 MG tablet Take 1 tablet (25 mg total) by mouth 2 (two) times daily. Patient taking differently: Take 50 mg by mouth at bedtime.  12/15/18   Burnell Blanks, MD  Multiple Vitamin (MULTIVITAMIN WITH MINERALS) TABS tablet Take 1 tablet by mouth at bedtime. Centrum silver men    [provider]  nitroGLYCERIN (NITROSTAT) 0.4 MG SL tablet Place 1 tablet (0.4 mg total) under the tongue every 5 (five) minutes as needed for chest pain. 12/15/18   Burnell Blanks, MD  tamsulosin (FLOMAX) 0.4 MG CAPS Take 0.8 mg by mouth at bedtime.     [provider]     Family History  Problem Relation Age of Onset  . Heart attack Father   . CAD Father   . Pancreatic cancer Father   . Emphysema Maternal Grandfather        smoked heavily   . Stomach cancer Paternal Grandmother   . Prostate cancer Paternal Uncle     Social History   Socioeconomic History  . Marital status: Divorced    Spouse name: Not on file  . Number of children: 2  . Years of education: Not on file  . Highest education level: Not on file  Occupational History  . Occupation: Retired  Tobacco Use  . Smoking status: Former Smoker    Packs/day: 1.00    Years: 50.00    Pack years: 50.00    Types: Cigarettes    Quit date: 06/01/2012    Years since quitting: 7.1  . Smokeless tobacco: Never Used  Substance and Sexual Activity  . Alcohol use: Yes    Comment: 3 beers daily  . Drug use: No  . Sexual activity: Not on file  Other Topics Concern  . Not on file  Social History Narrative   Patient lives with his daughter   Social Determinants of Health   Financial Resource Strain:   . Difficulty of Paying Living Expenses:   Food Insecurity:   . Worried About Charity fundraiser in the Last Year:   . Arboriculturist in the Last Year:   Transportation Needs:   . Lexicographer (Medical):   Marland Kitchen Lack of Transportation (Non-Medical):   Physical Activity:   . Days of Exercise per Week:   . Minutes of Exercise per Session:   Stress:   . Feeling of Stress :   Social Connections:   . Frequency of Communication with Friends and Family:   . Frequency of Social Gatherings with Friends and Family:   . Attends Religious Services:   . Active Member of Clubs or Organizations:   . Attends Archivist Meetings:   Marland Kitchen Marital Status:     ECOG Status: 0 - Asymptomatic  Review of Systems  Review of Systems: A 12 point ROS discussed and pertinent positives are indicated in the HPI above.  All other systems  are negative.  Physical Exam No direct physical exam was performed (except for noted visual exam findings with Video Visits).   Vital Signs: There were no vitals taken for this visit.  Imaging: IR Angiogram Extremity Right  Result Date: 06/30/2019 INDICATION: 74 year old male with a history of abdominal aortic aneurysm status post endovascular aortic repair complicated by type 2 endoleak an enlargement of the aneurysm sac. He presents today for endovascular endoleak repair. EXAM: IR ULTRASOUND GUIDANCE VASC ACCESS RIGHT; SELECTIVE VISCERAL ARTERIOGRAPHY; ENDOVASC REPAIR ILIAC ARTERY; RIGHT EXTREMITY ARTERIOGRAPHY; ADDITIONAL ARTERIOGRAPHY 1. Ultrasound-guided vascular access right common femoral artery 2. Right lower extremity arteriogram 3. Catheterization of the right internal iliac artery with arteriogram 4. Catheterization of the ascending ileo lumbar artery with arteriogram 5. Catheterization of the right L4 lumbar artery with arteriogram 6. Catheterization of the endo sac of the abdominal aortic aneurysm with arteriography 7. Catheterization of the accessory left lower pole renal artery 8. Coil embolization of the left lower pole renal artery 9. Liquid embolic embolization of the endo sac 10. Coil embolization of the right L4 lumbar artery 11.  Catheterization of the superior mesenteric artery with arteriogram 12. Catheterization of the middle colic artery with arteriogram 13. Catheterization of the Arc of Riolan with arteriogram 14. Catheterization of the inferior mesenteric artery with arteriogram 15. Coil embolization of the origin of the inferior mesenteric artery MEDICATIONS: None. ANESTHESIA/SEDATION: Moderate (conscious) sedation was employed during this procedure. A total of Versed 1.5 mg and Fentanyl 75 mcg was administered intravenously. Moderate Sedation Time: 115 minutes. The patient's level of consciousness and vital signs were monitored continuously by radiology nursing throughout the procedure under my direct supervision. CONTRAST:  100 mL Isovue 300 FLUOROSCOPY TIME:  Fluoroscopy Time: 38 minutes 0 seconds (2681 mGy). COMPLICATIONS: None immediate. PROCEDURE: Informed consent was obtained from the patient following explanation of the procedure, risks, benefits and alternatives. The patient understands, agrees and consents for the procedure. All questions were addressed. A time out was performed prior to the initiation of the procedure. Maximal barrier sterile technique utilized including caps, mask, sterile gowns, sterile gloves, large sterile drape, hand hygiene, and Betadine prep. The right common femoral artery was interrogated with ultrasound and found to be widely patent. An image was obtained and stored for the medical record. Local anesthesia was attained by infiltration with 1% lidocaine. A small dermatotomy was made. Under real-time sonographic guidance, the vessel was punctured with a 21 gauge micropuncture needle. Using standard technique, the initial micro needle was exchanged over a 0.018 micro wire for a transitional 4 [place] micro sheath. The micro sheath was then exchanged over a 0.035 wire for a 5 French vascular sheath. A C2 cobra catheter was advanced into the right common iliac artery. A right lower extremity arteriogram  was performed. The internal iliac artery is widely patent. There is a prominent ascending ileo lumbar trunk which communicates with the right L4 lumbar artery. There is obvious filling of the endo sac on the initial contrast injection. A glidewire was used to select the internal iliac artery. The Cobra catheter was advanced into the internal iliac artery. Additional arteriography was performed in multiple obliquities in till the origin of the ascending ileo lumbar artery was successfully identified. The Cobra catheter was then advanced into the proximal ascending iliolumbar artery. Arteriography was performed. The ascending ileo lumbar artery anastomosis with the right L4 lumbar artery which then dumps into the endo sac. A Progreat microcatheter was next advanced over a 0.016 fathom wire and used  to navigate up the iliolumbar chain and into the right L4 lumbar artery. Arteriography was again performed confirming catheter location and persistent filling of the endo sac. The outflow from the endo sac can be identified as a lower pole accessory renal artery to the left kidney. The microcatheter was successfully advanced into the endo sac. The microcatheter was navigated into the more cephalad aspect of the endo sac. Arteriography was again performed in multiple obliquities in till the origin of the outflow inferior renal accessory artery was identified. The microcatheter was then successfully advanced into the lower pole renal artery. Flow is high. Coil embolization was then performed using a combination of fibered interlock detachable coils and non fibered Ruby detachable coils. Coil embolization was performed until there was no antegrade flow in the artery and resultant slower flow within the endo sac. Liquid embolic embolization was then performed using Onyx 36. The endo channel was completely filled with Onyx until there was some tracking into the right L4 lumbar artery. After successful liquid embolization, the  microcatheter was flushed and removed. A new Progreat microcatheter was readvanced up the iliolumbar chain and into the right L4 lumbar artery. Coil embolization was then performed utilizing a series of Ruby detachable microcoils. Contrast injection confirms completes occlusion of the artery at the excluded aortic aneurysm. No evidence of filling of the endo sac. The more peripheral artery remains patent with good collateral flow. The microcatheter was removed. The Cobra catheter was brought back into the external iliac artery and then advanced over a Bentson wire into the visceral abdominal aorta. The catheter was used to select the superior mesenteric artery. Arteriography was performed. There is a hypertrophic middle colic artery which communicates via an arc ovary along to provide supply to the inferior mesenteric artery distally. No convincing filling of the endo sac. The Progreat microcatheter was then advanced over the Fathom 16 wire into the middle colic artery. Arteriography was performed confirming that the middle colic artery communicates with the Arc of Riolan and then with the inferior mesenteric artery. The microcatheter was successfully advanced into the Arc of Riolan. Arteriography was again performed identifying the inflow from the inferior mesenteric artery. The catheter was successfully navigated up the inferior mesenteric artery. Contrast injection was performed. Flow is antegrade, however there is a patent channel with the endo sac. Given that this vessel communicates with the endo sac, there is a potential for recurrent endoleak in the future. Therefore, the decision was made to proceed with coil embolization of the origin of the IMA where it meets the endo sac. This was performed utilizing a 3 x 15 mm Ruby detachable coil. The microcatheter and 5 French catheter were subsequently removed. A limited right common femoral arteriogram was performed confirming common femoral arterial access.  Hemostasis was attained with the assistance of a 6 French Angio-Seal device. IMPRESSION: 1. Positive identification of a robust type 2 endoleak with inflow primarily via the right L4 lumbar artery and outflow via an accessory lower pole artery to the left kidney. 2. Successful traversal from the right internal iliac artery through the iliolumbar arcade and endo sac into the outflow accessory renal artery. 3. Coil embolization of the origin of the left lower pole accessory renal artery, liquid embolic embolization of the endo sac and coil embolization of the origin of the right L4 lumbar artery. 4. Confirmed patency of the origin of the inferior mesenteric artery at the endo sac. 5. Successful coil embolization of the origin of the inferior mesenteric  artery. Signed, Criselda Peaches, MD, [facility] Radiology Electronically Signed   By: Jacqulynn Cadet M.D.   On: 06/30/2019 15:39   IR Angiogram Renal Left Selective  Result Date: 07/01/2019 INDICATION: Add on code.  Please see previously dictated report. EXAM: IR RENAL SELECTIVE UNI INC S+I MODERATE SEDATION MEDICATIONS: Add on code. Please see previously dictated report. ANESTHESIA/SEDATION: Add on code. Please see previously dictated report. CONTRAST:  Add on code. Please see previously dictated report. FLUOROSCOPY TIME:  Add on code. Please see previously dictated report. COMPLICATIONS: None immediate. PROCEDURE: Add on code. Please see previously dictated report. IMPRESSION: Add on code. Please see previously dictated report. Electronically Signed   By: Jacqulynn Cadet M.D.   On: 07/01/2019 12:27   IR Angiogram Visceral Selective  Result Date: 06/30/2019 INDICATION: 74 year old male with a history of abdominal aortic aneurysm status post endovascular aortic repair complicated by type 2 endoleak an enlargement of the aneurysm sac. He presents today for endovascular endoleak repair. EXAM: IR  ULTRASOUND GUIDANCE VASC ACCESS RIGHT; SELECTIVE VISCERAL ARTERIOGRAPHY; ENDOVASC REPAIR ILIAC ARTERY; RIGHT EXTREMITY ARTERIOGRAPHY; ADDITIONAL ARTERIOGRAPHY 1. Ultrasound-guided vascular access right common femoral artery 2. Right lower extremity arteriogram 3. Catheterization of the right internal iliac artery with arteriogram 4. Catheterization of the ascending ileo lumbar artery with arteriogram 5. Catheterization of the right L4 lumbar artery with arteriogram 6. Catheterization of the endo sac of the abdominal aortic aneurysm with arteriography 7. Catheterization of the accessory left lower pole renal artery 8. Coil embolization of the left lower pole renal artery 9. Liquid embolic embolization of the endo sac 10. Coil embolization of the right L4 lumbar artery 11. Catheterization of the superior mesenteric artery with arteriogram 12. Catheterization of the middle colic artery with arteriogram 13. Catheterization of the Arc of Riolan with arteriogram 14. Catheterization of the inferior mesenteric artery with arteriogram 15. Coil embolization of the origin of the inferior mesenteric artery MEDICATIONS: None. ANESTHESIA/SEDATION: Moderate (conscious) sedation was employed during this procedure. A total of Versed 1.5 mg and Fentanyl 75 mcg was administered intravenously. Moderate Sedation Time: 115 minutes. The patient's level of consciousness and vital signs were monitored continuously by radiology nursing throughout the procedure under my direct supervision. CONTRAST:  100 mL Isovue 300 FLUOROSCOPY TIME:  Fluoroscopy Time: 38 minutes 0 seconds (2681 mGy). COMPLICATIONS: None immediate. PROCEDURE: Informed consent was obtained from the patient following explanation of the procedure, risks, benefits and alternatives. The patient understands, agrees and consents for the procedure. All questions were addressed. A time out was performed prior to the initiation of the procedure. Maximal barrier sterile technique  utilized including caps, mask, sterile gowns, sterile gloves, large sterile drape, hand hygiene, and Betadine prep. The right common femoral artery was interrogated with ultrasound and found to be widely patent. An image was obtained and stored for the medical record. Local anesthesia was attained by infiltration with 1% lidocaine. A small dermatotomy was made. Under real-time sonographic guidance, the vessel was punctured with a 21 gauge micropuncture needle. Using standard technique, the initial micro needle was exchanged over a 0.018 micro wire for a transitional 4 [place] micro sheath. The micro sheath was then exchanged over a 0.035 wire for a 5 French vascular sheath. A C2 cobra catheter was advanced into the right common iliac artery. A right lower extremity arteriogram was performed. The internal iliac artery is widely patent. There is a prominent ascending ileo lumbar trunk which communicates with the right L4 lumbar artery.  There is obvious filling of the endo sac on the initial contrast injection. A glidewire was used to select the internal iliac artery. The Cobra catheter was advanced into the internal iliac artery. Additional arteriography was performed in multiple obliquities in till the origin of the ascending ileo lumbar artery was successfully identified. The Cobra catheter was then advanced into the proximal ascending iliolumbar artery. Arteriography was performed. The ascending ileo lumbar artery anastomosis with the right L4 lumbar artery which then dumps into the endo sac. A Progreat microcatheter was next advanced over a 0.016 fathom wire and used to navigate up the iliolumbar chain and into the right L4 lumbar artery. Arteriography was again performed confirming catheter location and persistent filling of the endo sac. The outflow from the endo sac can be identified as a lower pole accessory renal artery to the left kidney. The microcatheter was successfully advanced into the endo sac. The  microcatheter was navigated into the more cephalad aspect of the endo sac. Arteriography was again performed in multiple obliquities in till the origin of the outflow inferior renal accessory artery was identified. The microcatheter was then successfully advanced into the lower pole renal artery. Flow is high. Coil embolization was then performed using a combination of fibered interlock detachable coils and non fibered Ruby detachable coils. Coil embolization was performed until there was no antegrade flow in the artery and resultant slower flow within the endo sac. Liquid embolic embolization was then performed using Onyx 36. The endo channel was completely filled with Onyx until there was some tracking into the right L4 lumbar artery. After successful liquid embolization, the microcatheter was flushed and removed. A new Progreat microcatheter was readvanced up the iliolumbar chain and into the right L4 lumbar artery. Coil embolization was then performed utilizing a series of Ruby detachable microcoils. Contrast injection confirms completes occlusion of the artery at the excluded aortic aneurysm. No evidence of filling of the endo sac. The more peripheral artery remains patent with good collateral flow. The microcatheter was removed. The Cobra catheter was brought back into the external iliac artery and then advanced over a Bentson wire into the visceral abdominal aorta. The catheter was used to select the superior mesenteric artery. Arteriography was performed. There is a hypertrophic middle colic artery which communicates via an arc ovary along to provide supply to the inferior mesenteric artery distally. No convincing filling of the endo sac. The Progreat microcatheter was then advanced over the Fathom 16 wire into the middle colic artery. Arteriography was performed confirming that the middle colic artery communicates with the Arc of Riolan and then with the inferior mesenteric artery. The microcatheter was  successfully advanced into the Arc of Riolan. Arteriography was again performed identifying the inflow from the inferior mesenteric artery. The catheter was successfully navigated up the inferior mesenteric artery. Contrast injection was performed. Flow is antegrade, however there is a patent channel with the endo sac. Given that this vessel communicates with the endo sac, there is a potential for recurrent endoleak in the future. Therefore, the decision was made to proceed with coil embolization of the origin of the IMA where it meets the endo sac. This was performed utilizing a 3 x 15 mm Ruby detachable coil. The microcatheter and 5 French catheter were subsequently removed. A limited right common femoral arteriogram was performed confirming common femoral arterial access. Hemostasis was attained with the assistance of a 6 French Angio-Seal device. IMPRESSION: 1. Positive identification of a robust type 2 endoleak with inflow  primarily via the right L4 lumbar artery and outflow via an accessory lower pole artery to the left kidney. 2. Successful traversal from the right internal iliac artery through the iliolumbar arcade and endo sac into the outflow accessory renal artery. 3. Coil embolization of the origin of the left lower pole accessory renal artery, liquid embolic embolization of the endo sac and coil embolization of the origin of the right L4 lumbar artery. 4. Confirmed patency of the origin of the inferior mesenteric artery at the endo sac. 5. Successful coil embolization of the origin of the inferior mesenteric artery. Signed, Criselda Peaches, MD, [place] [facility] Radiology Electronically Signed   By: Jacqulynn Cadet M.D.   On: 06/30/2019 15:39   IR Angiogram Selective Each Additional Vessel  Result Date: 06/30/2019 INDICATION: 74 year old male with a history of abdominal aortic aneurysm status post endovascular aortic repair complicated by type 2  endoleak an enlargement of the aneurysm sac. He presents today for endovascular endoleak repair. EXAM: IR ULTRASOUND GUIDANCE VASC ACCESS RIGHT; SELECTIVE VISCERAL ARTERIOGRAPHY; ENDOVASC REPAIR ILIAC ARTERY; RIGHT EXTREMITY ARTERIOGRAPHY; ADDITIONAL ARTERIOGRAPHY 1. Ultrasound-guided vascular access right common femoral artery 2. Right lower extremity arteriogram 3. Catheterization of the right internal iliac artery with arteriogram 4. Catheterization of the ascending ileo lumbar artery with arteriogram 5. Catheterization of the right L4 lumbar artery with arteriogram 6. Catheterization of the endo sac of the abdominal aortic aneurysm with arteriography 7. Catheterization of the accessory left lower pole renal artery 8. Coil embolization of the left lower pole renal artery 9. Liquid embolic embolization of the endo sac 10. Coil embolization of the right L4 lumbar artery 11. Catheterization of the superior mesenteric artery with arteriogram 12. Catheterization of the middle colic artery with arteriogram 13. Catheterization of the Arc of Riolan with arteriogram 14. Catheterization of the inferior mesenteric artery with arteriogram 15. Coil embolization of the origin of the inferior mesenteric artery MEDICATIONS: None. ANESTHESIA/SEDATION: Moderate (conscious) sedation was employed during this procedure. A total of Versed 1.5 mg and Fentanyl 75 mcg was administered intravenously. Moderate Sedation Time: 115 minutes. The patient's level of consciousness and vital signs were monitored continuously by radiology nursing throughout the procedure under my direct supervision. CONTRAST:  100 mL Isovue 300 FLUOROSCOPY TIME:  Fluoroscopy Time: 38 minutes 0 seconds (2681 mGy). COMPLICATIONS: None immediate. PROCEDURE: Informed consent was obtained from the patient following explanation of the procedure, risks, benefits and alternatives. The patient understands, agrees and consents for the procedure. All questions were addressed.  A time out was performed prior to the initiation of the procedure. Maximal barrier sterile technique utilized including caps, mask, sterile gowns, sterile gloves, large sterile drape, hand hygiene, and Betadine prep. The right common femoral artery was interrogated with ultrasound and found to be widely patent. An image was obtained and stored for the medical record. Local anesthesia was attained by infiltration with 1% lidocaine. A small dermatotomy was made. Under real-time sonographic guidance, the vessel was punctured with a 21 gauge micropuncture needle. Using standard technique, the initial micro needle was exchanged over a 0.018 micro wire for a transitional 4 [place] micro sheath. The micro sheath was then exchanged over a 0.035 wire for a 5 French vascular sheath. A C2 cobra catheter was advanced into the right common iliac artery. A right lower extremity arteriogram was performed. The internal iliac artery is widely patent. There is a prominent ascending ileo lumbar trunk which communicates with the right L4 lumbar artery. There is  obvious filling of the endo sac on the initial contrast injection. A glidewire was used to select the internal iliac artery. The Cobra catheter was advanced into the internal iliac artery. Additional arteriography was performed in multiple obliquities in till the origin of the ascending ileo lumbar artery was successfully identified. The Cobra catheter was then advanced into the proximal ascending iliolumbar artery. Arteriography was performed. The ascending ileo lumbar artery anastomosis with the right L4 lumbar artery which then dumps into the endo sac. A Progreat microcatheter was next advanced over a 0.016 fathom wire and used to navigate up the iliolumbar chain and into the right L4 lumbar artery. Arteriography was again performed confirming catheter location and persistent filling of the endo sac. The outflow from the endo sac can be identified as a lower pole accessory  renal artery to the left kidney. The microcatheter was successfully advanced into the endo sac. The microcatheter was navigated into the more cephalad aspect of the endo sac. Arteriography was again performed in multiple obliquities in till the origin of the outflow inferior renal accessory artery was identified. The microcatheter was then successfully advanced into the lower pole renal artery. Flow is high. Coil embolization was then performed using a combination of fibered interlock detachable coils and non fibered Ruby detachable coils. Coil embolization was performed until there was no antegrade flow in the artery and resultant slower flow within the endo sac. Liquid embolic embolization was then performed using Onyx 36. The endo channel was completely filled with Onyx until there was some tracking into the right L4 lumbar artery. After successful liquid embolization, the microcatheter was flushed and removed. A new Progreat microcatheter was readvanced up the iliolumbar chain and into the right L4 lumbar artery. Coil embolization was then performed utilizing a series of Ruby detachable microcoils. Contrast injection confirms completes occlusion of the artery at the excluded aortic aneurysm. No evidence of filling of the endo sac. The more peripheral artery remains patent with good collateral flow. The microcatheter was removed. The Cobra catheter was brought back into the external iliac artery and then advanced over a Bentson wire into the visceral abdominal aorta. The catheter was used to select the superior mesenteric artery. Arteriography was performed. There is a hypertrophic middle colic artery which communicates via an arc ovary along to provide supply to the inferior mesenteric artery distally. No convincing filling of the endo sac. The Progreat microcatheter was then advanced over the Fathom 16 wire into the middle colic artery. Arteriography was performed confirming that the middle colic artery  communicates with the Arc of Riolan and then with the inferior mesenteric artery. The microcatheter was successfully advanced into the Arc of [place]iolan. Arteriography was again performed identifying the inflow from the inferior mesenteric artery. The catheter was successfully navigated up the inferior mesenteric artery. Contrast injection was performed. Flow is antegrade, however there is a patent channel with the endo sac. Given that this vessel communicates with the endo sac, there is a potential for recurrent endoleak in the future. Therefore, the decision was made to proceed with coil embolization of the origin of the IMA where it meets the endo sac. This was performed utilizing a 3 x 15 mm Ruby detachable coil. The microcatheter and 5 French catheter were subsequently removed. A limited right common femoral arteriogram was performed confirming common femoral arterial access. Hemostasis was attained with the assistance of a 6 French Angio-Seal device. IMPRESSION: 1. 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 2. Successful traversal from the right internal iliac artery through the iliolumbar arcade and endo sac into the outflow accessory renal artery. 3. Coil embolization of the origin of the left lower pole accessory renal artery, liquid embolic embolization of the endo sac and coil embolization of the origin of the right L4 lumbar artery. 4. Confirmed patency of the origin of the inferior mesenteric artery at the endo sac. 5. Successful coil embolization of the origin of the inferior mesenteric artery. Signed, Criselda Peaches, MD, [facility] Radiology Electronically Signed   By: Jacqulynn Cadet M.D.   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 He presents today for endovascular endoleak repair. EXAM: IR ULTRASOUND GUIDANCE VASC ACCESS RIGHT; SELECTIVE VISCERAL ARTERIOGRAPHY; ENDOVASC REPAIR ILIAC ARTERY; RIGHT EXTREMITY ARTERIOGRAPHY; ADDITIONAL ARTERIOGRAPHY 1. Ultrasound-guided vascular access right common femoral artery 2. Right lower extremity arteriogram 3. Catheterization of the right internal iliac artery with arteriogram 4. Catheterization of the ascending ileo lumbar artery with arteriogram 5. Catheterization of the right L4 lumbar artery with arteriogram 6. Catheterization of the endo sac of the abdominal aortic aneurysm with arteriography 7. Catheterization of the accessory left lower pole renal artery 8. Coil embolization of the left lower pole renal artery 9. Liquid embolic embolization of the endo sac 10. Coil embolization of the right L4 lumbar artery 11. Catheterization of the superior mesenteric artery with arteriogram 12. Catheterization of the middle colic artery with arteriogram 13. Catheterization of the Arc of Riol[place] with arteriogram 14. Catheterization of the inferior mesenteric artery with arteriogram 15. Coil embolization of the origin of the inferior mesenteric artery MEDICATIONS: None. ANESTHESIA/SEDATION: Moderate (conscious) sedation was employed during this procedure. A total of Versed 1.5 mg and Fentanyl 75 mcg was administered intravenously. Moderate Sedation Time: 115 minutes. The patient's level of consciousness and vital signs were monitored continuously by radiology nursing throughout the procedure under my direct supervision. CONTRAST:  100 mL Isovue 300 FLUOROSCOPY TIME:  Fluoroscopy Time: 38 minutes 0 seconds (2681 mGy). COMPLICATIONS: None immediate. PROCEDURE: Informed consent was obtained from the patient following explanation of the procedure, risks, benefits and  alternatives. The patient understands, agrees and consents for the procedure. All questions were addressed. A time out was performed prior to the initiation of the procedure. Maximal barrier sterile technique utilized including caps, mask, sterile gowns, sterile gloves, large sterile drape, hand hygiene, and Betadine prep. The right common femoral artery was interrogated with ultrasound and found to be widely patent. An image was obtained and stored for the medical record. Local anesthesia was attained by infiltration with 1% lidocaine. A small dermatotomy was made. Under real-time sonographic guidance, the vessel was punctured with a 21 gauge micropuncture needle. Using standard technique, the initial micro needle was exchanged over a 0.018 micro wire for a transitional 4 [place] micro sheath. The micro sheath was then exchanged over a 0.035 wire for a 5 French vascular sheath. A C2 cobra catheter was advanced into the right common iliac artery. A right lower extremity arteriogram was performed. The internal iliac artery is widely patent. There is a prominent ascending ileo lumbar trunk which communicates with the right L4 lumbar artery. There is obvious filling  of the endo sac on the initial contrast injection. A glidewire was used to select the internal iliac artery. The Cobra catheter was advanced into the internal iliac artery. Additional arteriography was performed in multiple obliquities in till the origin of the ascending ileo lumbar artery was successfully identified. The Cobra catheter was then advanced into the proximal ascending iliolumbar artery. Arteriography was performed. The ascending ileo lumbar artery anastomosis with the right L4 lumbar artery which then dumps into the endo sac. A Progreat microcatheter was next advanced over a 0.016 fathom wire and used to navigate up the iliolumbar chain and into the right L4 lumbar artery. Arteriography was again performed confirming catheter location and  persistent filling of the endo sac. The outflow from the endo sac can be identified as a lower pole accessory renal artery to the left kidney. The microcatheter was successfully advanced into the endo sac. The microcatheter was navigated into the more cephalad aspect of the endo sac. Arteriography was again performed in multiple obliquities in till the origin of the outflow inferior renal accessory artery was identified. The microcatheter was then successfully advanced into the lower pole renal artery. Flow is high. Coil embolization was then performed using a combination of fibered interlock detachable coils and non fibered Ruby detachable coils. Coil embolization was performed until there was no antegrade flow in the artery and resultant slower flow within the endo sac. Liquid embolic embolization was then performed using Onyx 36. The endo channel was completely filled with Onyx until there was some tracking into the right L4 lumbar artery. After successful liquid embolization, the microcatheter was flushed and removed. A new Progreat microcatheter was readvanced up the iliolumbar chain and into the right L4 lumbar artery. Coil embolization was then performed utilizing a series of Ruby detachable microcoils. Contrast injection confirms completes occlusion of the artery at the excluded aortic aneurysm. No evidence of filling of the endo sac. The more peripheral artery remains patent with good collateral flow. The microcatheter was removed. The Cobra catheter was brought back into the external iliac artery and then advanced over a Bentson wire into the visceral abdominal aorta. The catheter was used to select the superior mesenteric artery. Arteriography was performed. There is a hypertrophic middle colic artery which communicates via an arc ovary along to provide supply to the inferior mesenteric artery distally. No convincing filling of the endo sac. The Progreat microcatheter was then advanced over the Fathom 16  wire into the middle colic artery. Arteriography was performed confirming that the middle colic artery communicates with the Arc of Riolan and then with the inferior mesenteric artery. The microcatheter was successfully advanced into the Arc of [place]iol[place]. Arteriography was again performed identifying the inflow from the inferior mesenteric artery. The catheter was successfully navigated up the inferior mesenteric artery. Contrast injection was performed. Flow is antegrade, however there is a patent channel with the endo sac. Given that this vessel communicates with the endo sac, there is a potential for recurrent endoleak in the future. Therefore, the decision was made to proceed with coil embolization of the origin of the IMA where it meets the endo sac. This was performed utilizing a 3 x 15 mm Ruby detachable coil. The microcatheter and 5 French catheter were subsequently removed. A limited right common femoral arteriogram was performed confirming common femoral arterial access. Hemostasis was attained with the assistance of a 6 French Angio-Seal device. IMPRESSION: 1. 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 2. Successful traversal from the right internal iliac artery through the iliolumbar arcade and endo sac into the outflow accessory renal artery. 3. Coil embolization of the origin of the left lower pole accessory renal artery, liquid embolic embolization of the endo sac and coil embolization of the origin of the right L4 lumbar artery. 4. Confirmed patency of the origin of the inferior mesenteric artery at the endo sac. 5. Successful coil embolization of the origin of the inferior mesenteric artery. Signed, Criselda Peaches, MD, [place] [facility] Electronically Signed   By: Jacqulynn Cadet M.D.   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 He presents today for endovascular endoleak repair. EXAM: IR ULTRASOUND GUIDANCE VASC ACCESS RIGHT; SELECTIVE VISCERAL ARTERIOGRAPHY; ENDOVASC REPAIR ILIAC ARTERY; RIGHT EXTREMITY ARTERIOGRAPHY; ADDITIONAL ARTERIOGRAPHY 1. Ultrasound-guided vascular access right common femoral artery 2. Right lower extremity arteriogram 3. Catheterization of the right internal iliac artery with arteriogram 4. Catheterization of the ascending ileo lumbar artery with arteriogram 5. Catheterization of the right L4 lumbar artery with arteriogram 6. Catheterization of the endo sac of the abdominal aortic aneurysm with arteriography 7. Catheterization of the accessory left lower pole renal artery 8. Coil embolization of the left lower pole renal artery 9. Liquid embolic embolization of the endo sac 10. Coil embolization of the right L4 lumbar artery 11. Catheterization of the superior mesenteric artery with arteriogram 12. Catheterization of the middle colic artery with arteriogram 13. Catheterization of the Arc of [place]iol[place] with arteriogram 14. Catheterization of the inferior mesenteric artery with arteriogram 15. Coil embolization of the origin of the inferior mesenteric artery MEDICATIONS: None. ANESTHESIA/SEDATION: Moderate (conscious) sedation was employed during this procedure. A total of Versed 1.5 mg and Fentanyl 75 mcg was administered intravenously. Moderate Sedation Time: 115 minutes. The patient's level of consciousness and vital signs were monitored continuously by radiology nursing throughout the procedure under my direct supervision. CONTRAST:  100 mL Isovue 300 FLUOROSCOPY TIME:  Fluoroscopy Time: 38 minutes 0 seconds (2681 mGy). COMPLICATIONS: None immediate. PROCEDURE:  Informed consent was obtained from the patient following explanation of the procedure, risks, benefits and alternatives. The patient understands, agrees and consents for the procedure. All questions were addressed. A time out was performed prior to the initiation of the procedure. Maximal barrier sterile technique utilized including caps, mask, sterile gowns, sterile gloves, large sterile drape, hand hygiene, and Betadine prep. The right common femoral artery was interrogated with ultrasound and found to be widely patent. An image was obtained and stored for the medical record. Local anesthesia was attained by infiltration with 1% lidocaine. A small dermatotomy was made. Under real-time sonographic guidance, the vessel was punctured with a 21 gauge micropuncture needle. Using standard technique, the initial micro needle was exchanged over a 0.018 micro wire for a transitional 4 [place] micro sheath. The micro sheath was then exchanged over a 0.035 wire for a 5 French vascular sheath. A C2 cobra catheter was advanced into the right common iliac artery. A right lower extremity arteriogram was performed. The internal iliac artery is widely patent. There is a prominent ascending ileo lumbar trunk which communicates with the right L4 lumbar artery. There is obvious filling of the  endo sac on the initial contrast injection. A glidewire was used to select the internal iliac artery. The Cobra catheter was advanced into the internal iliac artery. Additional arteriography was performed in multiple obliquities in till the origin of the ascending ileo lumbar artery was successfully identified. The Cobra catheter was then advanced into the proximal ascending iliolumbar artery. Arteriography was performed. The ascending ileo lumbar artery anastomosis with the right L4 lumbar artery which then dumps into the endo sac. A Progreat microcatheter was next advanced over a 0.016 fathom wire and used to navigate up the iliolumbar chain  and into the right L4 lumbar artery. Arteriography was again performed confirming catheter location and persistent filling of the endo sac. The outflow from the endo sac can be identified as a lower pole accessory renal artery to the left kidney. The microcatheter was successfully advanced into the endo sac. The microcatheter was navigated into the more cephalad aspect of the endo sac. Arteriography was again performed in multiple obliquities in till the origin of the outflow inferior renal accessory artery was identified. The microcatheter was then successfully advanced into the lower pole renal artery. Flow is high. Coil embolization was then performed using a combination of fibered interlock detachable coils and non fibered Ruby detachable coils. Coil embolization was performed until there was no antegrade flow in the artery and resultant slower flow within the endo sac. Liquid embolic embolization was then performed using Onyx 36. The endo channel was completely filled with Onyx until there was some tracking into the right L4 lumbar artery. After successful liquid embolization, the microcatheter was flushed and removed. A new Progreat microcatheter was readvanced up the iliolumbar chain and into the right L4 lumbar artery. Coil embolization was then performed utilizing a series of Ruby detachable microcoils. Contrast injection confirms completes occlusion of the artery at the excluded aortic aneurysm. No evidence of filling of the endo sac. The more peripheral artery remains patent with good collateral flow. The microcatheter was removed. The Cobra catheter was brought back into the external iliac artery and then advanced over a Bentson wire into the visceral abdominal aorta. The catheter was used to select the superior mesenteric artery. Arteriography was performed. There is a hypertrophic middle colic artery which communicates via an arc ovary along to provide supply to the inferior mesenteric artery distally.  No convincing filling of the endo sac. The Progreat microcatheter was then advanced over the Fathom 16 wire into the middle colic artery. Arteriography was performed confirming that the middle colic artery communicates with the Arc of [place]iol[place] and then with the inferior mesenteric artery. The microcatheter was successfully advanced into the Arc of [place]iol[place]. Arteriography was again performed identifying the inflow from the inferior mesenteric artery. The catheter was successfully navigated up the inferior mesenteric artery. Contrast injection was performed. Flow is antegrade, however there is a patent channel with the endo sac. Given that this vessel communicates with the endo sac, there is a potential for recurrent endoleak in the future. Therefore, the decision was made to proceed with coil embolization of the origin of the IMA where it meets the endo sac. This was performed utilizing a 3 x 15 mm Ruby detachable coil. The microcatheter and 5 French catheter were subsequently removed. A limited right common femoral arteriogram was performed confirming common femoral arterial access. Hemostasis was attained with the assistance of a 6 French Angio-Seal device. IMPRESSION: 1. 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 2. Successful traversal from the right internal iliac artery through the iliolumbar arcade and endo sac into the outflow accessory renal artery. 3. Coil embolization of the origin of the left lower pole accessory renal artery, liquid embolic embolization of the endo sac and coil embolization of the origin of the right L4 lumbar artery. 4. Confirmed patency of the origin of the inferior mesenteric artery at the endo sac. 5. Successful coil embolization of the origin of the inferior mesenteric artery. Signed, Criselda Peaches, MD, [place] [facility] Radiology Electronically Signed   By: Jacqulynn Cadet M.D.   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 He presents today for endovascular endoleak repair. EXAM: IR ULTRASOUND GUIDANCE VASC ACCESS RIGHT; SELECTIVE VISCERAL ARTERIOGRAPHY; ENDOVASC REPAIR ILIAC ARTERY; RIGHT EXTREMITY ARTERIOGRAPHY; ADDITIONAL ARTERIOGRAPHY 1. Ultrasound-guided vascular access right common femoral artery 2. Right lower extremity arteriogram 3. Catheterization of the right internal iliac artery with arteriogram 4. Catheterization of the ascending ileo lumbar artery with arteriogram 5. Catheterization of the right L4 lumbar artery with arteriogram 6. Catheterization of the endo sac of the abdominal aortic aneurysm with arteriography 7. Catheterization of the accessory left lower pole renal artery 8. Coil embolization of the left lower pole renal artery 9. Liquid embolic embolization of the endo sac 10. Coil embolization of the right L4 lumbar artery 11. Catheterization of the superior mesenteric artery with arteriogram 12. Catheterization of the middle colic artery with arteriogram 13. Catheterization of the Arc of [place] with arteriogram 14. Catheterization of the inferior mesenteric artery with arteriogram 15. Coil embolization of the origin of the inferior mesenteric artery MEDICATIONS: None. ANESTHESIA/SEDATION: Moderate (conscious) sedation was employed during this procedure. A total of Versed 1.5 mg and Fentanyl 75 mcg was administered intravenously. Moderate Sedation Time: 115 minutes. The patient's level of consciousness and vital signs were monitored continuously by radiology nursing throughout the procedure under my direct supervision. CONTRAST:  100 mL Isovue 300 FLUOROSCOPY TIME:   Fluoroscopy Time: 38 minutes 0 seconds (2681 mGy). COMPLICATIONS: None immediate. PROCEDURE: Informed consent was obtained from the patient following explanation of the procedure, risks, benefits and alternatives. The patient understands, agrees and consents for the procedure. All questions were addressed. A time out was performed prior to the initiation of the procedure. Maximal barrier sterile technique utilized including caps, mask, sterile gowns, sterile gloves, large sterile drape, hand hygiene, and Betadine prep. The right common femoral artery was interrogated with ultrasound and found to be widely patent. An image was obtained and stored for the medical record. Local anesthesia was attained by infiltration with 1% lidocaine. A small dermatotomy was made. Under real-time sonographic guidance, the vessel was punctured with a 21 gauge micropuncture needle. Using standard technique, the initial micro needle was exchanged over a 0.018 micro wire for a transitional 4 [place] micro sheath. The micro sheath was then exchanged over a 0.035 wire for a 5 French vascular sheath. A C2 cobra catheter was advanced into the right common iliac artery. A right lower extremity arteriogram was performed. The internal iliac artery is widely patent. There is a prominent ascending ileo lumbar trunk which communicates with the right L4 lumbar artery. There is obvious filling of the endo sac  on the initial contrast injection. A glidewire was used to select the internal iliac artery. The Cobra catheter was advanced into the internal iliac artery. Additional arteriography was performed in multiple obliquities in till the origin of the ascending ileo lumbar artery was successfully identified. The Cobra catheter was then advanced into the proximal ascending iliolumbar artery. Arteriography was performed. The ascending ileo lumbar artery anastomosis with the right L4 lumbar artery which then dumps into the endo sac. A Progreat  microcatheter was next advanced over a 0.016 fathom wire and used to navigate up the iliolumbar chain and into the right L4 lumbar artery. Arteriography was again performed confirming catheter location and persistent filling of the endo sac. The outflow from the endo sac can be identified as a lower pole accessory renal artery to the left kidney. The microcatheter was successfully advanced into the endo sac. The microcatheter was navigated into the more cephalad aspect of the endo sac. Arteriography was again performed in multiple obliquities in till the origin of the outflow inferior renal accessory artery was identified. The microcatheter was then successfully advanced into the lower pole renal artery. Flow is high. Coil embolization was then performed using a combination of fibered interlock detachable coils and non fibered Ruby detachable coils. Coil embolization was performed until there was no antegrade flow in the artery and resultant slower flow within the endo sac. Liquid embolic embolization was then performed using Onyx 36. The endo channel was completely filled with Onyx until there was some tracking into the right L4 lumbar artery. After successful liquid embolization, the microcatheter was flushed and removed. A new Progreat microcatheter was readvanced up the iliolumbar chain and into the right L4 lumbar artery. Coil embolization was then performed utilizing a series of Ruby detachable microcoils. Contrast injection confirms completes occlusion of the artery at the excluded aortic aneurysm. No evidence of filling of the endo sac. The more peripheral artery remains patent with good collateral flow. The microcatheter was removed. The Cobra catheter was brought back into the external iliac artery and then advanced over a Bentson wire into the visceral abdominal aorta. The catheter was used to select the superior mesenteric artery. Arteriography was performed. There is a hypertrophic middle colic artery  which communicates via an arc ovary along to provide supply to the inferior mesenteric artery distally. No convincing filling of the endo sac. The Progreat microcatheter was then advanced over the Fathom 16 wire into the middle colic artery. Arteriography was performed confirming that the middle colic artery communicates with the Arc of [place] and then with the inferior mesenteric artery. The microcatheter was successfully advanced into the Arc of [place]. Arteriography was again performed identifying the inflow from the inferior mesenteric artery. The catheter was successfully navigated up the inferior mesenteric artery. Contrast injection was performed. Flow is antegrade, however there is a patent channel with the endo sac. Given that this vessel communicates with the endo sac, there is a potential for recurrent endoleak in the future. Therefore, the decision was made to proceed with coil embolization of the origin of the IMA where it meets the endo sac. This was performed utilizing a 3 x 15 mm Ruby detachable coil. The microcatheter and 5 French catheter were subsequently removed. A limited right common femoral arteriogram was performed confirming common femoral arterial access. Hemostasis was attained with the assistance of a 6 French Angio-Seal device. IMPRESSION: 1. 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 2. Successful traversal from the right internal iliac artery through the iliolumbar arcade and endo sac into the outflow accessory renal artery. 3. Coil embolization of the origin of the left lower pole accessory renal artery, liquid embolic embolization of the endo sac and coil embolization of the origin of the right L4 lumbar artery. 4. Confirmed patency of the origin of the inferior mesenteric artery at the endo sac. 5. Successful coil embolization of the origin of the inferior  mesenteric artery. Signed, Criselda Peaches, MD, [facility] Radiology Electronically Signed   By: Jacqulynn Cadet M.D.   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 He presents today for endovascular endoleak repair. EXAM: IR ULTRASOUND GUIDANCE VASC ACCESS RIGHT; SELECTIVE VISCERAL ARTERIOGRAPHY; ENDOVASC REPAIR ILIAC ARTERY; RIGHT EXTREMITY ARTERIOGRAPHY; ADDITIONAL ARTERIOGRAPHY 1. Ultrasound-guided vascular access right common femoral artery 2. Right lower extremity arteriogram 3. Catheterization of the right internal iliac artery with arteriogram 4. Catheterization of the ascending ileo lumbar artery with arteriogram 5. Catheterization of the right L4 lumbar artery with arteriogram 6. Catheterization of the endo sac of the abdominal aortic aneurysm with arteriography 7. Catheterization of the accessory left lower pole renal artery 8. Coil embolization of the left lower pole renal artery 9. Liquid embolic embolization of the endo sac 10. Coil embolization of the right L4 lumbar artery 11. Catheterization of the superior mesenteric artery with arteriogram 12. Catheterization of the middle colic artery with arteriogram 13. Catheterization of the Arc of [place] with arteriogram 14. Catheterization of the inferior mesenteric artery with arteriogram 15. Coil embolization of the origin of the inferior mesenteric artery MEDICATIONS: None. ANESTHESIA/SEDATION: Moderate (conscious) sedation was employed during this procedure. A total of Versed 1.5 mg and Fentanyl 75 mcg was administered intravenously. Moderate Sedation Time: 115 minutes. The patient's level of consciousness and vital signs were monitored continuously by  radiology nursing throughout the procedure under my direct supervision. CONTRAST:  100 mL Isovue 300 FLUOROSCOPY TIME:  Fluoroscopy Time: 38 minutes 0 seconds (2681 mGy). COMPLICATIONS: None immediate. PROCEDURE: Informed consent was obtained from the patient following explanation of the procedure, risks, benefits and alternatives. The patient understands, agrees and consents for the procedure. All questions were addressed. A time out was performed prior to the initiation of the procedure. Maximal barrier sterile technique utilized including caps, mask, sterile gowns, sterile gloves, large sterile drape, hand hygiene, and Betadine prep. The right common femoral artery was interrogated with ultrasound and found to be widely patent. An image was obtained and stored for the medical record. Local anesthesia was attained by infiltration with 1% lidocaine. A small dermatotomy was made. Under real-time sonographic guidance, the vessel was punctured with a 21 gauge micropuncture needle. Using standard technique, the initial micro needle was exchanged over a 0.018 micro wire for a transitional 4 [place] micro sheath. The micro sheath was then exchanged over a 0.035 wire for a 5 French vascular sheath. A C2 cobra catheter was advanced into the right common iliac artery. A right lower extremity arteriogram was performed. The internal iliac artery is widely patent. There is a prominent ascending ileo lumbar trunk which communicates with the right L4 lumbar artery. There is obvious filling of the endo  sac on the initial contrast injection. A glidewire was used to select the internal iliac artery. The Cobra catheter was advanced into the internal iliac artery. Additional arteriography was performed in multiple obliquities in till the origin of the ascending ileo lumbar artery was successfully identified. The Cobra catheter was then advanced into the proximal ascending iliolumbar artery. Arteriography was performed. The ascending  ileo lumbar artery anastomosis with the right L4 lumbar artery which then dumps into the endo sac. A Progreat microcatheter was next advanced over a 0.016 fathom wire and used to navigate up the iliolumbar chain and into the right L4 lumbar artery. Arteriography was again performed confirming catheter location and persistent filling of the endo sac. The outflow from the endo sac can be identified as a lower pole accessory renal artery to the left kidney. The microcatheter was successfully advanced into the endo sac. The microcatheter was navigated into the more cephalad aspect of the endo sac. Arteriography was again performed in multiple obliquities in till the origin of the outflow inferior renal accessory artery was identified. The microcatheter was then successfully advanced into the lower pole renal artery. Flow is high. Coil embolization was then performed using a combination of fibered interlock detachable coils and non fibered Ruby detachable coils. Coil embolization was performed until there was no antegrade flow in the artery and resultant slower flow within the endo sac. Liquid embolic embolization was then performed using Onyx 36. The endo channel was completely filled with Onyx until there was some tracking into the right L4 lumbar artery. After successful liquid embolization, the microcatheter was flushed and removed. A new Progreat microcatheter was readvanced up the iliolumbar chain and into the right L4 lumbar artery. Coil embolization was then performed utilizing a series of Ruby detachable microcoils. Contrast injection confirms completes occlusion of the artery at the excluded aortic aneurysm. No evidence of filling of the endo sac. The more peripheral artery remains patent with good collateral flow. The microcatheter was removed. The Cobra catheter was brought back into the external iliac artery and then advanced over a Bentson wire into the visceral abdominal aorta. The catheter was used to  select the superior mesenteric artery. Arteriography was performed. There is a hypertrophic middle colic artery which communicates via an arc ovary along to provide supply to the inferior mesenteric artery distally. No convincing filling of the endo sac. The Progreat microcatheter was then advanced over the Fathom 16 wire into the middle colic artery. Arteriography was performed confirming that the middle colic artery communicates with the Arc of [place] and then with the inferior mesenteric artery. The microcatheter was successfully advanced into the Arc of [place]. Arteriography was again performed identifying the inflow from the inferior mesenteric artery. The catheter was successfully navigated up the inferior mesenteric artery. Contrast injection was performed. Flow is antegrade, however there is a patent channel with the endo sac. Given that this vessel communicates with the endo sac, there is a potential for recurrent endoleak in the future. Therefore, the decision was made to proceed with coil embolization of the origin of the IMA where it meets the endo sac. This was performed utilizing a 3 x 15 mm Ruby detachable coil. The microcatheter and 5 French catheter were subsequently removed. A limited right common femoral arteriogram was performed confirming common femoral arterial access. Hemostasis was attained with the assistance of a 6 French Angio-Seal device. IMPRESSION: 1. 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 2. Successful traversal from the right internal iliac artery through the iliolumbar arcade and endo sac into the outflow accessory renal artery. 3. Coil embolization of the origin of the left lower pole accessory renal artery, liquid embolic embolization of the endo sac and coil embolization of the origin of the right L4 lumbar artery. 4. Confirmed patency of the origin  of the inferior mesenteric artery at the endo sac. 5. Successful coil embolization of the origin of the inferior mesenteric artery. Signed, Criselda Peaches, MD, [facility] Radiology Electronically Signed   By: Jacqulynn Cadet M.D.   On: 06/30/2019 15:39    Labs:  CBC: Recent Labs    06/30/19 0655  WBC 7.0  HGB 17.1*  HCT 50.0  PLT 142*    COAGS: Recent Labs    06/30/19 0655  INR 1.0    BMP: Recent Labs    06/30/19 0655  NA 139  K 4.1  CL 105  CO2 22  GLUCOSE 112*  BUN 24*  CALCIUM 9.2  CREATININE 1.25*  GFRNONAA 57*  GFRAA >60    LIVER FUNCTION TESTS: No results for input(s): BILITOT, AST, ALT, ALKPHOS, PROT, ALBUMIN in the last 8760 hours.  TUMOR MARKERS: No results for input(s): AFPTM, CEA, CA199, CHROMGRNA in the last 8760 hours.  Assessment and Plan:  Doing extremely well status post endovascular repair of type II endoleak.  His right groin site has healed without issue.  He has no issues or ill effects.  1.)  CTA of the abdomen and pelvis with accompanying clinic visit in 6 months.   Electronically Signed: Jacqulynn Cadet 07/28/2019, 4:13 PM   I spent a total of 10 Minutes in remote  clinical consultation, greater than 50% of which was counseling/coordinating care for type 2 endoleak.    Visit type: Audio only (telephone). Audio (no video) only due to patient preference. Alternative for in-person consultation at [facility], [address], [place], [place]. This visit type was conducted due to national recommendations for restrictions regarding the COVID-19 Pandemic (e.g. social distancing).  This format is felt to be most appropriate for this patient at this time.  All issues noted in this document were discussed and addressed.

## 2019-08-16 DIAGNOSIS — R519 Headache, unspecified: Secondary | ICD-10-CM | POA: Diagnosis not present

## 2019-08-16 DIAGNOSIS — C61 Malignant neoplasm of prostate: Secondary | ICD-10-CM | POA: Diagnosis not present

## 2019-08-16 DIAGNOSIS — D7589 Other specified diseases of blood and blood-forming organs: Secondary | ICD-10-CM | POA: Diagnosis not present

## 2019-08-16 DIAGNOSIS — E7800 Pure hypercholesterolemia, unspecified: Secondary | ICD-10-CM | POA: Diagnosis not present

## 2019-08-16 DIAGNOSIS — R030 Elevated blood-pressure reading, without diagnosis of hypertension: Secondary | ICD-10-CM | POA: Diagnosis not present

## 2019-08-17 ENCOUNTER — Other Ambulatory Visit: Payer: Self-pay | Admitting: Family Medicine

## 2019-08-17 DIAGNOSIS — R519 Headache, unspecified: Secondary | ICD-10-CM

## 2019-08-22 ENCOUNTER — Ambulatory Visit (INDEPENDENT_AMBULATORY_CARE_PROVIDER_SITE_OTHER): Payer: Medicare Other

## 2019-08-22 ENCOUNTER — Other Ambulatory Visit: Payer: Self-pay

## 2019-08-22 DIAGNOSIS — R519 Headache, unspecified: Secondary | ICD-10-CM

## 2019-08-22 MED ORDER — IOHEXOL 300 MG/ML  SOLN
100.0000 mL | Freq: Once | INTRAMUSCULAR | Status: AC | PRN
Start: 2019-08-22 — End: 2019-08-22
  Administered 2019-08-22: 75 mL via INTRAVENOUS

## 2019-08-31 NOTE — Addendum Note (Signed)
Encounter addended by: Riley Churches on: 08/31/2019 11:28 AM  Actions taken: Imaging Exam ended

## 2019-09-06 ENCOUNTER — Other Ambulatory Visit: Payer: Self-pay | Admitting: Cardiovascular Disease

## 2019-09-08 DIAGNOSIS — H524 Presbyopia: Secondary | ICD-10-CM | POA: Diagnosis not present

## 2019-09-08 DIAGNOSIS — H2512 Age-related nuclear cataract, left eye: Secondary | ICD-10-CM | POA: Diagnosis not present

## 2019-09-12 DIAGNOSIS — R030 Elevated blood-pressure reading, without diagnosis of hypertension: Secondary | ICD-10-CM | POA: Diagnosis not present

## 2019-09-12 DIAGNOSIS — E7800 Pure hypercholesterolemia, unspecified: Secondary | ICD-10-CM | POA: Diagnosis not present

## 2019-09-12 DIAGNOSIS — Z8679 Personal history of other diseases of the circulatory system: Secondary | ICD-10-CM | POA: Diagnosis not present

## 2019-09-12 DIAGNOSIS — R519 Headache, unspecified: Secondary | ICD-10-CM | POA: Diagnosis not present

## 2019-09-12 DIAGNOSIS — I6529 Occlusion and stenosis of unspecified carotid artery: Secondary | ICD-10-CM | POA: Diagnosis not present

## 2019-09-14 ENCOUNTER — Other Ambulatory Visit (HOSPITAL_BASED_OUTPATIENT_CLINIC_OR_DEPARTMENT_OTHER): Payer: Self-pay | Admitting: Family Medicine

## 2019-09-14 DIAGNOSIS — I6529 Occlusion and stenosis of unspecified carotid artery: Secondary | ICD-10-CM

## 2019-09-19 ENCOUNTER — Ambulatory Visit (HOSPITAL_BASED_OUTPATIENT_CLINIC_OR_DEPARTMENT_OTHER)
Admission: RE | Admit: 2019-09-19 | Discharge: 2019-09-19 | Disposition: A | Discharge status: Home / Self Care | Payer: Medicare Other | Source: Ambulatory Visit | Attending: Family Medicine | Admitting: Family Medicine

## 2019-09-19 ENCOUNTER — Other Ambulatory Visit: Payer: Self-pay

## 2019-09-19 ENCOUNTER — Encounter (HOSPITAL_COMMUNITY): Payer: Self-pay

## 2019-09-19 DIAGNOSIS — I6529 Occlusion and stenosis of unspecified carotid artery: Secondary | ICD-10-CM | POA: Insufficient documentation

## 2019-09-19 DIAGNOSIS — I63233 Cerebral infarction due to unspecified occlusion or stenosis of bilateral carotid arteries: Secondary | ICD-10-CM | POA: Diagnosis not present

## 2019-09-19 DIAGNOSIS — I10 Essential (primary) hypertension: Secondary | ICD-10-CM | POA: Diagnosis not present

## 2019-09-20 ENCOUNTER — Encounter (HOSPITAL_COMMUNITY): Payer: Self-pay

## 2019-09-20 NOTE — Addendum Note (Signed)
Encounter addended by: Riley Churches on: 09/20/2019 2:58 PM  Actions taken: Imaging Exam ended

## 2019-09-20 NOTE — Addendum Note (Signed)
Encounter addended by: Riley Churches on: 09/20/2019 2:53 PM  Actions taken: Imaging Exam ended, Charge Capture section accepted

## 2019-09-20 NOTE — Addendum Note (Signed)
Encounter addended by: Riley Churches on: 09/20/2019 2:52 PM  Actions taken: Order list changed, Imaging Exam ended

## 2019-10-04 NOTE — Progress Notes (Signed)
Cardiology Office Note    Date:  10/05/2019   ID:  Juan Lynn, DOB 1946/01/24, MRN [id number]  PCP:  Leighton Ruff, MD  Cardiologist:  Lauree Chandler, MD  Electrophysiologist:  None   Chief Complaint: f/u CAD  History of Present Illness:   Juan Lynn is a 74 y.o. male with history of CAD (inferior STEMI 06/2012 s/p DES to RCA, mild residual Cx disease), COPD, central sleep apnea (compliant with BiPAP), carotid artery disease, former tobacco abuse, prostate cancer and AAA s/p aortoiiliac endovascular repair September 2019 (followed by [facility]) here today for routine cardiac follow-up.  Cardiac hx as outlined above. Last echo 11/2017 showed EF 55-60%, grade 1 DD, mild LAE. Also followed by [facility] for history of AAA repair with known endoleak post-op, s/p subsequent f/u IR repair in 06/2019. He followed up with his PCP earlier this summer for headaches. CT head nonacute, + microvascular changes. Carotid US showed <50% RICA, approximately 64% LICA. Fortunately his headaches have improved without specific intervention. He returns for follow-up today overall doing well from a cardiac standpoint. He does not report any recent angina, dyspnea, edema, palpitations or syncope. He has a history of rare fleeting chest pains every 3rd day but they are come and gone in just a brief moment and not associated with anything in particular. This does not feel like prior angina. He appears very motivated towards health with list of questions. See below regarding BP readings. He appears comfortable with medical terminology although does not have a medical background.  Labwork independently reviewed: 08/2019 CMET wnl except Tbili 1.3, otherwise K 3.6, Cr 1.04, normal AST ALT, Hgb 15.7, Hct 45.7, plt 161, h/o macrocytosis, Tchol 127, HDL 50, LDL 60  12/2018 TChol 120, trig 107, LDL 50   Past Medical History:  Diagnosis Date  . AAA (abdominal aortic aneurysm) ([place])   . BPH (benign prostatic hyperplasia)     . CAD (coronary artery disease) 06/25/2012   a. Inf STEMI s/p cath- DES-mid RCA b. Echo- EF 55%, basal inferior HK  . Cancer ([place])   . COPD (chronic obstructive pulmonary disease) ([place])   . OSA (obstructive sleep apnea)   . Tobacco abuse     Past Surgical History:  Procedure Laterality Date  . ABDOMINAL AORTIC ENDOVASCULAR STENT GRAFT N/A 11/11/2017   Procedure: ABDOMINAL AORTIC ENDOVASCULAR STENT GRAFT;  Surgeon: Marty Heck, MD;  Location: [facility];  Service: Vascular;  Laterality: N/A;  . CATARACT EXTRACTION    . CORONARY ANGIOPLASTY WITH STENT PLACEMENT  06/25/2012   100% mid RCA s/p DES, 30% prox AVG Cx, mild luminal irregularities elsewhere; EF 55%  . HERNIA REPAIR    . IR ANGIOGRAM PELVIS SELECTIVE OR SUPRASELECTIVE  06/30/2019  . IR ANGIOGRAM SELECTIVE EACH ADDITIONAL VESSEL  06/30/2019  . IR ANGIOGRAM SELECTIVE EACH ADDITIONAL VESSEL  06/30/2019  . IR ANGIOGRAM SELECTIVE EACH ADDITIONAL VESSEL  06/30/2019  . IR ANGIOGRAM VISCERAL SELECTIVE  06/30/2019  . IR ANGIOGRAM VISCERAL SELECTIVE  06/30/2019  . IR EMBO ARTERIAL NOT HEMORR HEMANG INC GUIDE ROADMAPPING  06/30/2019  . IR RADIOLOGIST EVAL & MGMT  06/01/2019  . IR RADIOLOGIST EVAL & MGMT  07/28/2019  . IR US GUIDE VASC ACCESS RIGHT  06/30/2019  . LEFT HEART CATHETERIZATION WITH CORONARY ANGIOGRAM N/A 06/25/2012   Procedure: LEFT HEART CATHETERIZATION WITH CORONARY ANGIOGRAM;  Surgeon: Burnell Blanks, MD;  Location: [facility] C[facility] LAB;  Service: Cardiovascular;  Laterality: N/A;  . PERCUTANEOUS CORONARY STENT INTERVENTION (PCI-S)  06/25/2012  Procedure: PERCUTANEOUS CORONARY STENT INTERVENTION (PCI-S);  Surgeon: Burnell Blanks, MD;  Location: [facility] CATH LAB;  Service: Cardiovascular;;    Current Medications: Current Meds  Medication Sig  . aspirin EC 81 MG tablet Take 81 mg by mouth at bedtime.  Marland Kitchen atorvastatin (LIPITOR) 80 MG tablet Take 1 tablet (80 mg total) by mouth at bedtime.  . clopidogrel (PLAVIX) 75 MG tablet  Take 1 tablet (75 mg total) by mouth at bedtime.  . finasteride (PROSCAR) 5 MG tablet Take 5 mg by mouth at bedtime.   . Fluticasone-Salmeterol (ADVAIR DISKUS) 100-50 MCG/DOSE AEPB Inhale 1 puff into the lungs 2 (two) times daily as needed (shortness).  . Multiple Vitamin (MULTIVITAMIN WITH MINERALS) TABS tablet Take 1 tablet by mouth at bedtime. Centrum silver men  . nitroGLYCERIN (NITROSTAT) 0.4 MG SL tablet Place 1 tablet (0.4 mg total) under the tongue every 5 (five) minutes as needed for chest pain.  . tamsulosin (FLOMAX) 0.4 MG CAPS Take 0.8 mg by mouth at bedtime.   . [DISCONTINUED] atorvastatin (LIPITOR) 80 MG tablet Take 1 tablet (80 mg total) by mouth at bedtime. Please keep upcoming appt in August for future refills. Thank you  . [DISCONTINUED] clopidogrel (PLAVIX) 75 MG tablet Take 1 tablet (75 mg total) by mouth at bedtime. Please keep upcoming in August for future refills. Thank you  . [DISCONTINUED] metoprolol tartrate (LOPRESSOR) 25 MG tablet Take 1 tablet (25 mg total) by mouth 2 (two) times daily. (Patient taking differently: Take 50 mg by mouth at bedtime. )  . [DISCONTINUED] nitroGLYCERIN (NITROSTAT) 0.4 MG SL tablet Place 1 tablet (0.4 mg total) under the tongue every 5 (five) minutes as needed for chest pain.     Allergies:   Patient has no known allergies.   Social History   Socioeconomic History  . Marital status: Divorced    Spouse name: Not on file  . Number of children: 2  . Years of education: Not on file  . Highest education level: Not on file  Occupational History  . Occupation: Retired  Tobacco Use  . Smoking status: Former Smoker    Packs/day: 1.00    Years: 50.00    Pack years: 50.00    Types: Cigarettes    Quit date: 06/01/2012    Years since quitting: 7.3  . Smokeless tobacco: Never Used  Vaping Use  . Vaping Use: Never used  Substance and Sexual Activity  . Alcohol use: Yes    Comment: 3 beers daily  . Drug use: No  . Sexual activity: Not on  file  Other Topics Concern  . Not on file  Social History Narrative   Patient lives with his daughter   Social Determinants of Health   Financial Resource Strain:   . Difficulty of Paying Living Expenses:   Food Insecurity:   . Worried About Charity fundraiser in the Last Year:   . Arboriculturist in the Last Year:   Transportation Needs:   . Film/video editor (Medical):   Marland Kitchen Lack of Transportation (Non-Medical):   Physical Activity:   . Days of Exercise per Week:   . Minutes of Exercise per Session:   Stress:   . Feeling of Stress :   Social Connections:   . Frequency of Communication with Friends and Family:   . Frequency of Social Gatherings with Friends and Family:   . Attends Religious Services:   . Active Member of Clubs or Organizations:   . Attends  Club or Organization Meetings:   Marland Kitchen Marital Status:      Family History:  The patient's family history includes CAD in his father; Emphysema in his maternal grandfather; Heart attack in his father; Pancreatic cancer in his father; Prostate cancer in his paternal uncle; Stomach cancer in his paternal grandmother.  ROS:   Please see the history of present illness.  All other systems are reviewed and otherwise negative.    EKGs/Labs/Other Studies Reviewed:    Studies reviewed are outlined and summarized above. Reports included below if pertinent.  Cath 06/2012 Procedure Performed:  1. Left Heart Catheterization 2. Selective Coronary Angiography 3. Left ventricular angiogram 4. Aspiration Thrombectomy RCA 5. PTCA/DES x 1 mid RCA 6. Angioseal RFA  Operator: Lauree Chandler, MD  Indication: 74 yo male with history of COPD, tobacco abuse, OSA who is transferred via EMS with acute inferior STEMI. Pain began at 9am this morning. Code STEMI called 12:52pm. Pt with ongoing chest pain upon arrival at [facility].                                       Procedure Details: The risks, benefits, complications, treatment  options, and expected outcomes were discussed with the patient. The patient and/or family concurred with the proposed plan, giving informed verbal consent. Emergency consent documented. The patient was brought to the cath lab via EMS. The patient was not sedated due to hypotension. The right groin was prepped and draped in the usual manner. Using the modified Seldinger access technique, a 6 French sheath was placed in the right femoral artery. A JL4 catheter was used to engage the left main artery. Selective angiopraphy of the left coronary system was performed. I then engaged the RCA with a 6 [place] JR4 guiding catheter. The RCA was found to be totally occluded in the mid vessel. He was given a bolus of Angiomax and a drip was started. He was given 60 mg Effient po x 1. When the ACT was greater than 200, I passed a BMW wire down the RCA and beyond the total occlusion. Flow was re-established with the wire. I then used a 2.5 x 15 mm balloon to pre-dilate the stenosis. I then passed a Terumo Priority One aspiration catheter into the mid RCA and extracted a large red thrombus. The haziness in the mid RCA improved after thrombectomy. I then carefully positioned a 3.0 x 24 mm Promus Premier in the mid RCA and deployed it at 14 atm. The stent was post-dilated with a 3.5 x 20 mm Falcon balloon x 1. There was an excellent result. The stenosis was taken from 100% down to 0%.  A pigtail catheter was used to perform a left ventricular angiogram. An Angioseal femoral artery closure device was placed in the right femoral artery.   The case was complicated by relative hypotension and bradycardia. He was given atropine x 1.   There were no immediate procedural complications. The patient was taken to the CCU in stable condition.   Hemodynamic Findings: Central aortic pressure: 108/66 Left ventricular pressure: 107/9/16  Angiographic Findings:  Left main:  No obstructive disease.   Left Anterior Descending Artery:  Large caliber vessel that courses to the apex. Mild luminal irregularities in the mid vessel.   Circumflex Artery: Large caliber vessel with termination into a obtuse marginal branch. 30% proximal AV groove Circumflex stenosis. Small intermediate branch   Right Coronary Artery:  Large dominant vessel with 100% mid occlusion.   Left Ventricular Angiogram: LVEF=55%  Impression: 1. Inferior STEMI secondary to occluded mid RCA 2. Successful PTCA/DES x 1 mid RCA 3. Mild disease LAD and Circumflex 4. Preserved LV systolic function  Recommendations: Will admit to CCU. He will be continued on on ASA and Effient for one year. Will start beta blocker and statin. If BP tolerates, start Ace-inh in am. Echo in am. No plans for d/c home before Sunday.        Complications:  None. The patient tolerated the procedure well.       2D Echo 11/2017  - Left ventricle: The cavity size was normal. Systolic function was  normal. The estimated ejection fraction was in the range of 55%  to 60%. Wall motion was normal; there were no regional wall  motion abnormalities. Doppler parameters are consistent with  abnormal left ventricular relaxation (grade 1 diastolic  dysfunction).  - Left atrium: The atrium was mildly dilated.        EKG:  EKG is ordered today, personally reviewed, demonstrating NSR 68bpm, nonspecific TW changes III, avF, similar to prior (looks identical to 2019)  Recent Labs: 06/30/2019: BUN 24; Creatinine, Ser 1.25; Hemoglobin 17.1; Platelets 142; Potassium 4.1; Sodium 139  Recent Lipid Panel    Component Value Date/Time   CHOL 121 11/10/2016 0000   TRIG 115 11/10/2016 0000   HDL 47 11/10/2016 0000   CHOLHDL 2.6 11/10/2016 0000   CHOLHDL 2 08/11/2012 0924   VLDL 16.4 08/11/2012 0924   LDLCALC 51 11/10/2016 0000    PHYSICAL EXAM:    VS:  BP (!) 154/90   Pulse 68   Ht _0  (1.702 m)   Wt 191 lb 9.6 oz (86.9 kg)   SpO2 96%   BMI 30.01 kg/m   BMI: Body mass index  is 30.01 kg/m.  GEN: Well nourished, well developed WM, in no acute distress HEENT: normocephalic, atraumatic Neck: no JVD, carotid bruits, or masses Cardiac: RRR; no murmurs, rubs, or gallops, no edema  Respiratory:  clear to auscultation bilaterally, normal work of breathing GI: soft, nontender, nondistended, + BS MS: no deformity or atrophy Skin: warm and dry, no rash Neuro:  Alert and Oriented x 3, Strength and sensation are intact, follows commands Psych: euthymic mood, full affect  Wt Readings from Last 3 Encounters:  10/05/19 191 lb 9.6 oz (86.9 kg)  06/30/19 195 lb (88.5 kg)  05/24/19 195 lb (88.5 kg)     ASSESSMENT & PLAN:   1. CAD - doing well from cardiac standpoint. Reports rare focal fleeting chest pains but no anginal-type discomfort and no other concerning symptoms. EKG stable. Continue current regimen except see #3. Dr. Angelena Form has maintained him on long term DAPT. LDL was at goal of <70 by labs 08/2019, which are historically monitored by primary care. Continue statin. 2. AAA - this is followed by vascular team. The patient indicates Dr. Drema Dallas recently reached out to Dr. Carlis Abbott to discuss if there was any specific surgical impact from recent procedure driving elevated blood pressures. The patient plans to discuss again with his PCP. Otherwise continue risk factor modification.  3. Essential HTN - BP elevated in the office. He reports a hx of white coat phenomenon. He brings in a log of BPs which are reasonably controlled (mid 120s-130s) although a few readings above 017 systolic. Given his vascular disease would like to see all of his readings under 510 systolic. He indicates he had a  conversation with his PCP about advancing his regimen but he strongly prefers to work on lifestyle modifications first. He has an appt with a dietitian. He does report he is taking metoprolol all at once nightly - we will consolidate his Lopressor to Toprol. This may have an impact on longer  lasting BP control throughout the day. He will reach out if his numbers continue to show readings greater than 130. Amlodipine might be a consideration if needed. 4. Carotid artery disease - discussed risk factor modification as above. We will arrange f/u carotid duplex in 08/2020.   Disposition: F/u with Dr. Angelena Form in 1 year.   Medication Adjustments/Labs and Tests Ordered: Current medicines are reviewed at length with the patient today.  Concerns regarding medicines are outlined above. Medication changes, Labs and Tests ordered today are summarized above and listed in the Patient Instructions accessible in Encounters.   Signed, Charlie Pitter, PA-C  10/05/2019 2:36 PM    [facility] [address], [place], [place]  [address] Phone: [phone]; Fax: [phone]

## 2019-10-05 ENCOUNTER — Ambulatory Visit (INDEPENDENT_AMBULATORY_CARE_PROVIDER_SITE_OTHER): Payer: Medicare Other | Admitting: Physician Assistant

## 2019-10-05 ENCOUNTER — Telehealth: Payer: Self-pay | Admitting: Physician Assistant

## 2019-10-05 ENCOUNTER — Encounter: Payer: Self-pay | Admitting: Physician Assistant

## 2019-10-05 ENCOUNTER — Other Ambulatory Visit: Payer: Self-pay

## 2019-10-05 VITALS — BP 154/90 | HR 68 | Ht 67.0 in | Wt 191.6 lb

## 2019-10-05 DIAGNOSIS — G4733 Obstructive sleep apnea (adult) (pediatric): Secondary | ICD-10-CM

## 2019-10-05 DIAGNOSIS — I714 Abdominal aortic aneurysm, without rupture, unspecified: Secondary | ICD-10-CM

## 2019-10-05 DIAGNOSIS — I6523 Occlusion and stenosis of bilateral carotid arteries: Secondary | ICD-10-CM

## 2019-10-05 DIAGNOSIS — I2510 Atherosclerotic heart disease of native coronary artery without angina pectoris: Secondary | ICD-10-CM | POA: Diagnosis not present

## 2019-10-05 DIAGNOSIS — I10 Essential (primary) hypertension: Secondary | ICD-10-CM | POA: Diagnosis not present

## 2019-10-05 MED ORDER — NITROGLYCERIN 0.4 MG SL SUBL: 0.4000 mg | SUBLINGUAL_TABLET | SUBLINGUAL | 3 refills | Status: DC | PRN

## 2019-10-05 MED ORDER — ATORVASTATIN CALCIUM 80 MG PO TABS: 80.0000 mg | ORAL_TABLET | Freq: Every day | ORAL | 3 refills | Status: DC

## 2019-10-05 MED ORDER — CLOPIDOGREL BISULFATE 75 MG PO TABS: 75.0000 mg | ORAL_TABLET | Freq: Every day | ORAL | 3 refills | Status: DC

## 2019-10-05 MED ORDER — METOPROLOL SUCCINATE ER 50 MG PO TB24
50.0000 mg | ORAL_TABLET | Freq: Every day | ORAL | 3 refills | Status: DC
Start: 2019-10-05 — End: 2020-08-01

## 2019-10-05 NOTE — Patient Instructions (Signed)
Medication Instructions:  Your physician has recommended you make the following change in your medication:  1.  STOP Lopressor 2.  START Toprol XL 50 mg taking 1 tablet daily   *If you need a refill on your cardiac medications before your next appointment, please call your pharmacy*   Lab Work: None ordered  If you have labs (blood work) drawn today and your tests are completely normal, you will receive your results only by: Marland Kitchen MyChart Message (if you have MyChart) OR . A paper copy in the mail If you have any lab test that is abnormal or we need to change your treatment, we will call you to review the results.   Testing/Procedures: Your physician has requested that you have a carotid duplex IN July 2022. This test is an ultrasound of the carotid arteries in your neck. It looks at blood flow through these arteries that supply the brain with blood. Allow one hour for this exam. There are no restrictions or special instructions.     Follow-Up: At [facility], you and your health needs are our priority.  As part of our continuing mission to provide you with exceptional heart care, we have created designated Provider Care Teams.  These Care Teams include your primary Cardiologist (physician) and Advanced Practice Providers (APPs -  Physician Assistants and Nurse Practitioners) who all work together to provide you with the care you need, when you need it.  We recommend signing up for the patient portal called "MyChart".  Sign up information is provided on this After Visit Summary.  MyChart is used to connect with patients for Virtual Visits (Telemedicine).  Patients are able to view lab/test results, encounter notes, upcoming appointments, etc.  Non-urgent messages can be sent to your provider as well.   To learn more about what you can do with MyChart, go to [email or web address].    Your next appointment:   12 month(s)  The format for your next appointment:   In Person  Provider:    You may see Lauree Chandler, MD or one of the following Advanced Practice Providers on your designated Care Team:    Melina Copa, PA-C  Ermalinda Barrios, PA-C    Other Instructions

## 2019-10-05 NOTE — Telephone Encounter (Signed)
   Please let Mr. Byers know I discussed pomegranate juice with our pharmacy. There are mixed verdicts but it has been theorized that pomegranate juice can inhibit some of the liver enzymes that break down medications metabolized through the liver. Therefore to be on safe side would not drink pomegranate juice. (Plus, food benefits are best obtained when eaten in food form rather than juice form.) So while on medicine, especially the cholesterol medicine, probably best to limit this. Thanks.  Larenz Frasier PA-C

## 2019-10-06 ENCOUNTER — Other Ambulatory Visit: Payer: Self-pay

## 2019-10-06 ENCOUNTER — Encounter: Payer: Self-pay | Attending: Family Medicine | Admitting: Skilled Nursing Facility1

## 2019-10-06 ENCOUNTER — Encounter: Payer: Self-pay | Admitting: Skilled Nursing Facility1

## 2019-10-06 DIAGNOSIS — I2510 Atherosclerotic heart disease of native coronary artery without angina pectoris: Secondary | ICD-10-CM | POA: Insufficient documentation

## 2019-10-06 NOTE — Telephone Encounter (Signed)
Follow up   Pt is returning call, pt said if he unable to answer the phone to leave him a detailed message

## 2019-10-06 NOTE — Telephone Encounter (Signed)
Placed call to pt regarding phone message below.  Left a message for pt to call back.

## 2019-10-06 NOTE — Telephone Encounter (Signed)
Returned pt's call.  He has been made aware of the recommendation below regarding cholesterol medication and pomegranate juice.  Pt was very grateful for the follow up.

## 2019-10-06 NOTE — Progress Notes (Signed)
  Assessment:  Primary concerns today: cardiovascular health.    Pt states he wants a heart healthy diet. Pt states he takes his blood pressure and weight daily. Pt states he did have a heart attack in 2014. Pt states he is trying to lose weight. Pt states he is about 187 pounds. Pt states in his 60's he was about 160 pounds. Pt states he keeps a garden. Pt states he does not eat lettuce due to pesticide.  Pt satets he is trying to cut back on drinking beer.  Pt states he is not a fan of water. Pt states he is a meat and potatoes kind of guy so these dietary changes have been tough.   MEDICATIONS: see list   DIETARY INTAKE:  Usual eating pattern includes 3 meals and 2 snacks per day.  Everyday foods include none stated  Avoided foods include processed meats.    24-hr recall:  B ( AM): shredded wheat + blueberries + 2% cows milk OR skipped Snk ( AM):  L ( PM): tomatoes, green leafy radish  Snk ( PM): dates D ( PM): pork loin roast + brown rice + carrots + onion or homony [place] pasole  Snk ( PM):  Beverages: light beer, water, black coffee, tequila + orange juice  Usual physical activity: ADL's   Progress Towards Goal(s):  In progress.    Intervention:  Nutrition counseling. Dietitian educated pt on balanced diet within the context of heart disease.  Goals: incorporate more cold water fish such as salmon Incorporate more plant based proteins continue to severely limit processed meats such as salami and lunch meat continue to limit beer to 2 a day If you want to try cherry juice get no sugar added Aim for 30-60 minutes of activity per day: try a stationary bike in your living room while you watch the game Aim for 1500-2000 mg of sodium per day by avoiding eating out and canned foods that are not low sodium and frozen meals  Teaching Method Utilized:  Visual Auditory Hands on  Barriers to learning/adherence to lifestyle change: none identified   Demonstrated degree of  understanding via:  Teach Back   Monitoring/Evaluation:  Dietary intake, exercise, and body weight prn.

## 2019-10-26 DIAGNOSIS — G4733 Obstructive sleep apnea (adult) (pediatric): Secondary | ICD-10-CM | POA: Diagnosis not present

## 2019-11-02 HISTORY — PX: CATARACT EXTRACTION: SUR2

## 2019-11-09 ENCOUNTER — Emergency Department (HOSPITAL_COMMUNITY)
Admission: EM | Admit: 2019-11-09 | Discharge: 2019-11-09 | Disposition: A | Discharge status: Home / Self Care | Payer: Medicare Other | Attending: Emergency Medicine | Admitting: Emergency Medicine

## 2019-11-09 ENCOUNTER — Other Ambulatory Visit: Payer: Self-pay

## 2019-11-09 DIAGNOSIS — Z85828 Personal history of other malignant neoplasm of skin: Secondary | ICD-10-CM | POA: Insufficient documentation

## 2019-11-09 DIAGNOSIS — Z87891 Personal history of nicotine dependence: Secondary | ICD-10-CM | POA: Diagnosis not present

## 2019-11-09 DIAGNOSIS — Z7901 Long term (current) use of anticoagulants: Secondary | ICD-10-CM | POA: Insufficient documentation

## 2019-11-09 DIAGNOSIS — R040 Epistaxis: Secondary | ICD-10-CM | POA: Diagnosis not present

## 2019-11-09 DIAGNOSIS — J449 Chronic obstructive pulmonary disease, unspecified: Secondary | ICD-10-CM | POA: Diagnosis not present

## 2019-11-09 DIAGNOSIS — Z79899 Other long term (current) drug therapy: Secondary | ICD-10-CM | POA: Insufficient documentation

## 2019-11-09 DIAGNOSIS — Z7982 Long term (current) use of aspirin: Secondary | ICD-10-CM | POA: Diagnosis not present

## 2019-11-09 DIAGNOSIS — I2510 Atherosclerotic heart disease of native coronary artery without angina pectoris: Secondary | ICD-10-CM | POA: Insufficient documentation

## 2019-11-09 DIAGNOSIS — Z955 Presence of coronary angioplasty implant and graft: Secondary | ICD-10-CM | POA: Diagnosis not present

## 2019-11-09 MED ORDER — OXYMETAZOLINE HCL 0.05 % NA SOLN
3.0000 | Freq: Once | NASAL | Status: AC
  Administered 2019-11-09: 3 via NASAL
  Filled 2019-11-09: qty 30

## 2019-11-09 MED ORDER — AMOXICILLIN 500 MG PO CAPS
500.0000 mg | ORAL_CAPSULE | Freq: Three times a day (TID) | ORAL | 0 refills | Status: DC
Start: 2019-11-09 — End: 2020-10-08

## 2019-11-09 MED ORDER — AMOXICILLIN 500 MG PO CAPS
500.0000 mg | ORAL_CAPSULE | Freq: Once | ORAL | Status: AC
  Administered 2019-11-09: 500 mg via ORAL
  Filled 2019-11-09: qty 1

## 2019-11-09 NOTE — Discharge Instructions (Signed)
We had to put a packing in to stop the bleeding.  The packing will need to stay in place until you see the ENT doctor.  If you are too uncomfortable, have uncontrollable nose bleeding, or other concerns, return here for checkup.  Start the antibiotic prescription, tomorrow morning.  Continue taking all of your usual medications.

## 2019-11-09 NOTE — ED Provider Notes (Signed)
[facility] EMERGENCY DEPARTMENT Provider Note   CSN: [id number] Arrival date & time: 11/09/19  1518     History Chief Complaint  Patient presents with  . Epistaxis    Juan Lynn is a 74 y.o. male.  HPI Who presents for evaluation of nosebleed since this morning.  He has had ongoing mild swelling bleeding with clots from the right nares primarily.  He denies weakness, dizziness, nausea, vomiting, cough, shortness of breath, sinus symptoms, ear pain, sore throat.  He takes Plavix, for cardiac purposes.  No prior similar problems.  There are no other known modifying factors.    Past Medical History:  Diagnosis Date  . AAA (abdominal aortic aneurysm) ([place])   . BPH (benign prostatic hyperplasia)   . CAD (coronary artery disease) 06/25/2012   a. Inf STEMI s/p cath- DES-mid RCA b. Echo- EF 55%, basal inferior HK  . Cancer ([place])   . Carotid artery disease ([place])   . COPD (chronic obstructive pulmonary disease) ([place])   . OSA (obstructive sleep apnea)   . Tobacco abuse     Patient Active Problem List   Diagnosis Date Noted  . AAA (abdominal aortic aneurysm) ([place]) 11/09/2017  . Abdominal aortic aneurysm ([place]) 10/27/2017  . Hyperlipidemia 09/25/2014  . Left inguinal hernia 07/28/2013  . Tobacco abuse 06/27/2012  . CAD (coronary artery disease), native coronary artery 06/27/2012  . COPD GOLD 0/1 06/27/2012  . OSA (obstructive sleep apnea) 06/27/2012  . Hyponatremia 06/27/2012  . ST elevation myocardial infarction (STEMI) of inferior wall, initial episode of care [facility]) 06/25/2012    Past Surgical History:  Procedure Laterality Date  . ABDOMINAL AORTIC ENDOVASCULAR STENT GRAFT N/A 11/11/2017   Procedure: ABDOMINAL AORTIC ENDOVASCULAR STENT GRAFT;  Surgeon: Marty Heck, MD;  Location: [place];  Service: Vascular;  Laterality: N/A;  . CATARACT EXTRACTION    . CORONARY ANGIOPLASTY WITH STENT PLACEMENT  06/25/2012   100% mid RCA s/p DES, 30% prox AVG Cx,  mild luminal irregularities elsewhere; EF 55%  . HERNIA REPAIR    . IR ANGIOGRAM PELVIS SELECTIVE OR SUPRASELECTIVE  06/30/2019  . IR ANGIOGRAM SELECTIVE EACH ADDITIONAL VESSEL  06/30/2019  . IR ANGIOGRAM SELECTIVE EACH ADDITIONAL VESSEL  06/30/2019  . IR ANGIOGRAM SELECTIVE EACH ADDITIONAL VESSEL  06/30/2019  . IR ANGIOGRAM VISCERAL SELECTIVE  06/30/2019  . IR ANGIOGRAM VISCERAL SELECTIVE  06/30/2019  . IR EMBO ARTERIAL NOT HEMORR HEMANG INC GUIDE ROADMAPPING  06/30/2019  . IR RADIOLOGIST EVAL & MGMT  06/01/2019  . IR RADIOLOGIST EVAL & MGMT  07/28/2019  . IR US GUIDE VASC ACCESS RIGHT  06/30/2019  . LEFT HEART CATHETERIZATION WITH CORONARY ANGIOGRAM N/A 06/25/2012   Procedure: LEFT HEART CATHETERIZATION WITH CORONARY ANGIOGRAM;  Surgeon: Burnell Blanks, MD;  Location: [facility] CATH LAB;  Service: Cardiovascular;  Laterality: N/A;  . PERCUTANEOUS CORONARY STENT INTERVENTION (PCI-S)  06/25/2012   Procedure: PERCUTANEOUS CORONARY STENT INTERVENTION (PCI-S);  Surgeon: Burnell Blanks, MD;  Location: [facility] CATH LAB;  Service: Cardiovascular;;       Family History  Problem Relation Age of Onset  . Heart attack Father   . CAD Father   . Pancreatic cancer Father   . Emphysema Maternal Grandfather        smoked heavily   . Stomach cancer Paternal Grandmother   . Prostate cancer Paternal Uncle     Social History   Tobacco Use  . Smoking status: Former Smoker    Packs/day: 1.00  Years: 50.00    Pack years: 50.00    Types: Cigarettes    Quit date: 06/01/2012    Years since quitting: 7.4  . Smokeless tobacco: Never Used  Vaping Use  . Vaping Use: Never used  Substance Use Topics  . Alcohol use: Yes    Comment: 3 beers daily  . Drug use: No    Home Medications Prior to Admission medications   Medication Sig Start Date End Date Taking? Authorizing Provider  amoxicillin (AMOXIL) 500 MG capsule Take 1 capsule (500 mg total) by mouth 3 (three) times daily. 11/09/19   Daleen Bo, MD    aspirin EC 81 MG tablet Take 81 mg by mouth at bedtime.    [provider]  atorvastatin (LIPITOR) 80 MG tablet Take 1 tablet (80 mg total) by mouth at bedtime. 10/05/19   Dunn, Nedra Hai, PA-C  clopidogrel (PLAVIX) 75 MG tablet Take 1 tablet (75 mg total) by mouth at bedtime. 10/05/19   Dunn, Nedra Hai, PA-C  finasteride (PROSCAR) 5 MG tablet Take 5 mg by mouth at bedtime.     [provider]  Fluticasone-Salmeterol (ADVAIR DISKUS) 100-50 MCG/DOSE AEPB Inhale 1 puff into the lungs 2 (two) times daily as needed (shortness).    [provider]  metoprolol succinate (TOPROL-XL) 50 MG 24 hr tablet Take 1 tablet (50 mg total) by mouth daily. Take with or immediately following a meal. 10/05/19 01/03/20  Dunn, Nedra Hai, PA-C  Multiple Vitamin (MULTIVITAMIN WITH MINERALS) TABS tablet Take 1 tablet by mouth at bedtime. Centrum silver men    [provider]  nitroGLYCERIN (NITROSTAT) 0.4 MG SL tablet Place 1 tablet (0.4 mg total) under the tongue every 5 (five) minutes as needed for chest pain. 10/05/19   Dunn, Nedra Hai, PA-C  tamsulosin (FLOMAX) 0.4 MG CAPS Take 0.8 mg by mouth at bedtime.     [provider]    Allergies    Patient has no known allergies.  Review of Systems   Review of Systems  All other systems reviewed and are negative.   Physical Exam Updated Vital Signs BP (!) 187/114 (BP Location: Left Arm)   Pulse 96   Temp 98.8 F (37.1 C) (Oral)   Resp (!) 92   Ht 5\' 8"  (1.727 m)   Wt 85.3 kg   SpO2 95%   BMI 28.59 kg/m   Physical Exam Vitals and nursing note reviewed.  Constitutional:      General: He is not in acute distress.    Appearance: He is well-developed. He is not ill-appearing, toxic-appearing or diaphoretic.  HENT:     Head: Normocephalic and atraumatic.     Right Ear: External ear normal.     Left Ear: External ear normal.  Eyes:     Conjunctiva/sclera: Conjunctivae normal.     Pupils: Pupils are equal, round, and reactive to  light.  Neck:     Trachea: Phonation normal.  Cardiovascular:     Rate and Rhythm: Normal rate and regular rhythm.  Pulmonary:     Effort: Pulmonary effort is normal.  Abdominal:     Palpations: Abdomen is soft.     Tenderness: There is no abdominal tenderness.  Musculoskeletal:        General: Normal range of motion.     Cervical back: Normal range of motion and neck supple.  Skin:    General: Skin is warm and dry.  Neurological:     Mental Status: He is alert and  oriented to person, place, and time.     Cranial Nerves: No cranial nerve deficit.     Sensory: No sensory deficit.     Motor: No abnormal muscle tone.     Coordination: Coordination normal.  Psychiatric:        Behavior: Behavior normal.        Thought Content: Thought content normal.        Judgment: Judgment normal.     ED Results / Procedures / Treatments   Labs (all labs ordered are listed, but only abnormal results are displayed) Labs Reviewed - No data to display  EKG None  Radiology No results found.  Procedures .Epistaxis Management  Date/Time: 11/09/2019 6:10 PM Performed by: Daleen Bo, MD Authorized by: Daleen Bo, MD   Consent:    Consent obtained:  Verbal   Consent given by:  Patient   Risks discussed:  Bleeding, infection and pain   Alternatives discussed:  No treatment Anesthesia (see MAR for exact dosages):    Anesthesia method:  None Procedure details:    Treatment site:  R anterior   Treatment method:  Silver nitrate and anterior pack   Treatment complexity:  Extensive   Treatment episode: recurring   Post-procedure details:    Assessment:  Bleeding stopped   Patient tolerance of procedure:  Tolerated well, no immediate complications Comments:     Patient given Afrin, bilaterally, which is easily controlled the bleeding.  Mild oozing of blood right anterior midline, which was treated with silver nitrate successfully.  There is no bleeding actively left nares.  Patient  held pressure for 15 minutes and was observed without pressure for 15 more minutes, after which he developed a narrow caliber for a small clot, indicating ongoing active bleeding.  At that point a 5.5 cm anterior rapid Rhino balloon was placed by me, inflated to 5 cc.   (including critical care time)  Medications Ordered in ED Medications  oxymetazoline (AFRIN) 0.05 % nasal spray 3 spray (3 sprays Each Nare Given 11/09/19 1724)  amoxicillin (AMOXIL) capsule 500 mg (500 mg Oral Given 11/09/19 1842)    ED Course  I have reviewed the triage vital signs and the nursing notes.  Pertinent labs & imaging results that were available during my care of the patient were reviewed by me and considered in my medical decision making (see chart for details).    MDM Rules/Calculators/A&P                           Patient Vitals for the past 24 hrs:  BP Temp Temp src Pulse Resp SpO2 Height Weight  11/09/19 1917 (!) 187/114 98.8 F (37.1 C) Oral 96 (!) 92 95 % -- --  11/09/19 1901 -- -- -- 78 -- 96 % -- --  11/09/19 1900 (!) 173/136 -- -- 93 -- 95 % -- --  11/09/19 1749 (!) 168/115 -- -- 95 -- 94 % -- --  11/09/19 1745 -- -- -- (!) 104 -- 94 % -- --  11/09/19 1645 (!) 196/108 -- -- 99 -- 97 % -- --  11/09/19 1522 (!) 188/117 -- -- (!) 104 18 98 % -- --  11/09/19 1521 -- -- -- -- -- -- 5\' 8"  (1.727 m) 85.3 kg    7:15 PM Reevaluation with update and discussion. After initial assessment and treatment, an updated evaluation reveals bleeding is controlled and he is fairly comfortable.  Findings discussed with the patient and  all questions were answered. Daleen Bo   Medical Decision Making:  This patient is presenting for evaluation of nosebleeding, which does require a range of treatment options, and is a complaint that involves a high risk of morbidity and mortality. The differential diagnoses include spontaneous bleeding, infectious process, nasal, allergies, excessive bleeding secondary to  antiplatelet therapy. I decided to review old records, and in summary he began having bleeding from the right naris this morning, which is intermittent and persistent..  I did not require additional historical information from anyone.    Critical Interventions-clinical evaluation, treatment  After These Interventions, the Patient was reevaluated and was found stable for discharge.  Bleeding controlled with silver nitrate, Afrin and nasal packing  CRITICAL CARE- No Performed by: Daleen Bo  Nursing Notes Reviewed/ Care Coordinated Applicable Imaging Reviewed Interpretation of Laboratory Data incorporated into ED treatment  The patient appears reasonably screened and/or stabilized for discharge and I doubt any other medical condition or other [facility] requiring further screening, evaluation, or treatment in the ED at this time prior to discharge.  Plan: Home Medications-continue usual; Home Treatments-rest, fluids; return here if the recommended treatment, does not improve the symptoms; Recommended follow up-ENT follow-up 1 week and as needed.     Final Clinical Impression(s) / ED Diagnoses Final diagnoses:  Epistaxis    Rx / DC Orders ED Discharge Orders         Ordered    amoxicillin (AMOXIL) 500 MG capsule  3 times daily        11/09/19 1912           Daleen Bo, MD 11/09/19 [phone]

## 2019-11-09 NOTE — ED Triage Notes (Signed)
Pt had nosebleed x 2 yesterday after picking his nose that lasted 10-15 mins. Resumed bleeding again this morning at 0830 and has been bleeding since. Went to [facility] and they were unable to stop the bleeding. Pt on Plavix.

## 2019-11-13 ENCOUNTER — Emergency Department (HOSPITAL_COMMUNITY)
Admission: EM | Admit: 2019-11-13 | Discharge: 2019-11-14 | Disposition: A | Discharge status: Home / Self Care | Payer: Medicare Other | Attending: Emergency Medicine | Admitting: Emergency Medicine

## 2019-11-13 ENCOUNTER — Encounter (HOSPITAL_COMMUNITY): Payer: Self-pay | Admitting: Emergency Medicine

## 2019-11-13 ENCOUNTER — Other Ambulatory Visit: Payer: Self-pay

## 2019-11-13 DIAGNOSIS — R040 Epistaxis: Secondary | ICD-10-CM | POA: Insufficient documentation

## 2019-11-13 DIAGNOSIS — Z5321 Procedure and treatment not carried out due to patient leaving prior to being seen by health care provider: Secondary | ICD-10-CM | POA: Insufficient documentation

## 2019-11-13 LAB — CBC
HCT: 42.8 % (ref 39.0–52.0)
Hemoglobin: 14.5 g/dL (ref 13.0–17.0)
MCH: 32.9 pg (ref 26.0–34.0)
MCHC: 33.9 g/dL (ref 30.0–36.0)
MCV: 97.1 fL (ref 80.0–100.0)
Platelets: 171 10*3/uL (ref 150–400)
RBC: 4.41 MIL/uL (ref 4.22–5.81)
RDW: 12.1 % (ref 11.5–15.5)
WBC: 8.5 10*3/uL (ref 4.0–10.5)
nRBC: 0 % (ref 0.0–0.2)

## 2019-11-13 LAB — PROTIME-INR
INR: 1.1 (ref 0.8–1.2)
Prothrombin Time: 13.6 seconds (ref 11.4–15.2)

## 2019-11-13 MED ORDER — OXYMETAZOLINE HCL 0.05 % NA SOLN: 1.0000 | Freq: Once | NASAL | Status: DC

## 2019-11-13 NOTE — ED Notes (Signed)
Pt stated he as an appointment with his PCP on Tuesday and will return if things worsen.

## 2019-11-13 NOTE — ED Triage Notes (Signed)
Pt reports after coming in last Wednesday, his nose has continued to bleed "on and off" w/ the rapid rino falling out on Friday.  Has an appointment w/ specialist on Tuesday however he can't get the bleeding to stop.  Pt is on Plavix.

## 2019-11-14 DIAGNOSIS — R040 Epistaxis: Secondary | ICD-10-CM | POA: Diagnosis not present

## 2019-11-14 DIAGNOSIS — Z7901 Long term (current) use of anticoagulants: Secondary | ICD-10-CM | POA: Diagnosis not present

## 2019-11-14 DIAGNOSIS — I252 Old myocardial infarction: Secondary | ICD-10-CM | POA: Diagnosis not present

## 2019-11-22 DIAGNOSIS — Z7901 Long term (current) use of anticoagulants: Secondary | ICD-10-CM | POA: Diagnosis not present

## 2019-11-22 DIAGNOSIS — R040 Epistaxis: Secondary | ICD-10-CM | POA: Diagnosis not present

## 2019-12-26 DIAGNOSIS — Z8679 Personal history of other diseases of the circulatory system: Secondary | ICD-10-CM | POA: Diagnosis not present

## 2019-12-26 DIAGNOSIS — I779 Disorder of arteries and arterioles, unspecified: Secondary | ICD-10-CM | POA: Diagnosis not present

## 2019-12-26 DIAGNOSIS — D692 Other nonthrombocytopenic purpura: Secondary | ICD-10-CM | POA: Diagnosis not present

## 2019-12-26 DIAGNOSIS — R7989 Other specified abnormal findings of blood chemistry: Secondary | ICD-10-CM | POA: Diagnosis not present

## 2019-12-26 DIAGNOSIS — Z8546 Personal history of malignant neoplasm of prostate: Secondary | ICD-10-CM | POA: Diagnosis not present

## 2019-12-26 DIAGNOSIS — J449 Chronic obstructive pulmonary disease, unspecified: Secondary | ICD-10-CM | POA: Diagnosis not present

## 2019-12-26 DIAGNOSIS — G4733 Obstructive sleep apnea (adult) (pediatric): Secondary | ICD-10-CM | POA: Diagnosis not present

## 2019-12-26 DIAGNOSIS — E7800 Pure hypercholesterolemia, unspecified: Secondary | ICD-10-CM | POA: Diagnosis not present

## 2019-12-26 DIAGNOSIS — Z86010 Personal history of colonic polyps: Secondary | ICD-10-CM | POA: Diagnosis not present

## 2019-12-26 DIAGNOSIS — Z0000 Encounter for general adult medical examination without abnormal findings: Secondary | ICD-10-CM | POA: Diagnosis not present

## 2019-12-30 ENCOUNTER — Other Ambulatory Visit: Payer: Self-pay | Admitting: Interventional Radiology

## 2019-12-30 DIAGNOSIS — I9789 Other postprocedural complications and disorders of the circulatory system, not elsewhere classified: Secondary | ICD-10-CM

## 2020-01-16 ENCOUNTER — Other Ambulatory Visit: Payer: Medicare Other

## 2020-03-06 ENCOUNTER — Other Ambulatory Visit: Payer: Self-pay

## 2020-03-06 ENCOUNTER — Ambulatory Visit
Admission: RE | Admit: 2020-03-06 | Discharge: 2020-03-06 | Disposition: A | Discharge status: Home / Self Care | Payer: Medicare Other | Source: Ambulatory Visit | Attending: Urology | Admitting: Urology

## 2020-03-06 DIAGNOSIS — C61 Malignant neoplasm of prostate: Secondary | ICD-10-CM | POA: Diagnosis not present

## 2020-03-06 DIAGNOSIS — R590 Localized enlarged lymph nodes: Secondary | ICD-10-CM | POA: Diagnosis not present

## 2020-03-06 DIAGNOSIS — N400 Enlarged prostate without lower urinary tract symptoms: Secondary | ICD-10-CM | POA: Diagnosis not present

## 2020-03-06 MED ORDER — GADOBENATE DIMEGLUMINE 529 MG/ML IV SOLN
18.0000 mL | Freq: Once | INTRAVENOUS | Status: AC | PRN
  Administered 2020-03-06: 18 mL via INTRAVENOUS

## 2020-03-09 ENCOUNTER — Ambulatory Visit (HOSPITAL_COMMUNITY)
Admission: RE | Admit: 2020-03-09 | Discharge: 2020-03-09 | Disposition: A | Discharge status: Home / Self Care | Payer: Medicare Other | Source: Ambulatory Visit | Attending: Interventional Radiology | Admitting: Interventional Radiology

## 2020-03-09 ENCOUNTER — Encounter (HOSPITAL_COMMUNITY): Payer: Self-pay

## 2020-03-09 ENCOUNTER — Other Ambulatory Visit: Payer: Self-pay

## 2020-03-09 DIAGNOSIS — I9789 Other postprocedural complications and disorders of the circulatory system, not elsewhere classified: Secondary | ICD-10-CM | POA: Diagnosis not present

## 2020-03-09 DIAGNOSIS — I70203 Unspecified atherosclerosis of native arteries of extremities, bilateral legs: Secondary | ICD-10-CM | POA: Diagnosis not present

## 2020-03-09 DIAGNOSIS — I714 Abdominal aortic aneurysm, without rupture: Secondary | ICD-10-CM | POA: Diagnosis not present

## 2020-03-09 DIAGNOSIS — I712 Thoracic aortic aneurysm, without rupture: Secondary | ICD-10-CM | POA: Diagnosis not present

## 2020-03-09 DIAGNOSIS — N200 Calculus of kidney: Secondary | ICD-10-CM | POA: Diagnosis not present

## 2020-03-09 LAB — POCT I-STAT CREATININE: Creatinine, Ser: 1 mg/dL (ref 0.61–1.24)

## 2020-03-09 MED ORDER — IOHEXOL 350 MG/ML SOLN
100.0000 mL | Freq: Once | INTRAVENOUS | Status: AC | PRN
  Administered 2020-03-09: 100 mL via INTRAVENOUS

## 2020-03-14 ENCOUNTER — Other Ambulatory Visit: Payer: Self-pay

## 2020-03-14 ENCOUNTER — Other Ambulatory Visit: Payer: Self-pay | Admitting: *Deleted

## 2020-03-14 ENCOUNTER — Encounter: Payer: Self-pay | Admitting: *Deleted

## 2020-03-14 ENCOUNTER — Ambulatory Visit
Admission: RE | Admit: 2020-03-14 | Discharge: 2020-03-14 | Disposition: A | Discharge status: Home / Self Care | Payer: Medicare Other | Source: Ambulatory Visit | Attending: Interventional Radiology | Admitting: Interventional Radiology

## 2020-03-14 DIAGNOSIS — T82330D Leakage of aortic (bifurcation) graft (replacement), subsequent encounter: Secondary | ICD-10-CM | POA: Diagnosis not present

## 2020-03-14 DIAGNOSIS — I714 Abdominal aortic aneurysm, without rupture, unspecified: Secondary | ICD-10-CM

## 2020-03-14 DIAGNOSIS — I9789 Other postprocedural complications and disorders of the circulatory system, not elsewhere classified: Secondary | ICD-10-CM

## 2020-03-14 HISTORY — PX: IR RADIOLOGIST EVAL & MGMT: IMG5224

## 2020-03-14 NOTE — Progress Notes (Signed)
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    Referring Physician(s): Dr. Monica Martinez  History of Present Illness: Juan Lynn is a 75 y.o. male with a past medical history significant forabdominal aortic aneurysm complicated by type II endoleak between an accessory left lower pole renal artery, and the L4 lumbar arteries.  Juan Lynn underwent endovascular repair On 07/01/2019.  He was found to have inflow via the right L4 lumbar artery and outflow via an accessory artery to the lower pole of the left kidney.  His procedure was a success with coil embolization of both the outflow and inflow arteries as well as liquid embolic embolization of the Endosac itself.  Today we meet over the phone.  His postprocedural course has been uncomplicated.  He has no abdominal pain, back pain or groin pain.  He has no new systemic symptoms at this time.  Past Medical History:  Diagnosis Date  . AAA (abdominal aortic aneurysm) ([place])   . BPH (benign prostatic hyperplasia)   . CAD (coronary artery disease) 06/25/2012   a. Inf STEMI s/p cath- DES-mid RCA b. Echo- EF 55%, basal inferior HK  . Cancer ([place])   . Carotid artery disease ([place])   . COPD (chronic obstructive pulmonary disease) ([place])   . OSA (obstructive sleep apnea)   . Tobacco abuse     Past Surgical History:  Procedure Laterality Date  . ABDOMINAL AORTIC ENDOVASCULAR STENT GRAFT N/A 11/11/2017   Procedure: ABDOMINAL AORTIC ENDOVASCULAR STENT GRAFT;  Surgeon: Marty Heck, MD;  Location: [place];  Service: Vascular;  Laterality: N/A;  . CATARACT EXTRACTION    . CORONARY ANGIOPLASTY WITH STENT PLACEMENT  06/25/2012   100% mid RCA s/p DES, 30% prox AVG Cx, mild luminal irregularities elsewhere; EF 55%  . HERNIA REPAIR    . IR ANGIOGRAM PELVIS SELECTIVE OR SUPRASELECTIVE  06/30/2019  . IR ANGIOGRAM SELECTIVE EACH ADDITIONAL  VESSEL  06/30/2019  . IR ANGIOGRAM SELECTIVE EACH ADDITIONAL VESSEL  06/30/2019  . IR ANGIOGRAM SELECTIVE EACH ADDITIONAL VESSEL  06/30/2019  . IR ANGIOGRAM VISCERAL SELECTIVE  06/30/2019  . IR ANGIOGRAM VISCERAL SELECTIVE  06/30/2019  . IR EMBO ARTERIAL NOT HEMORR HEMANG INC GUIDE ROADMAPPING  06/30/2019  . IR RADIOLOGIST EVAL & MGMT  06/01/2019  . IR RADIOLOGIST EVAL & MGMT  07/28/2019  . IR US GUIDE VASC ACCESS RIGHT  06/30/2019  . LEFT HEART CATHETERIZATION WITH CORONARY ANGIOGRAM N/A 06/25/2012   Procedure: LEFT HEART CATHETERIZATION WITH CORONARY ANGIOGRAM;  Surgeon: Burnell Blanks, MD;  Location[facility] CATH LAB;  Service: Cardiovascular;  Laterality: N/A;  . PERCUTANEOUS CORONARY STENT INTERVENTION (PCI-S)  06/25/2012   Procedure: PERCUTANEOUS CORONARY STENT INTERVENTION (PCI-S);  Surgeon: Burnell Blanks, MD;  Location: [facility] CATH LAB;  Service: Cardiovascular;;    Allergies: Patient has no known allergies.  Medications: Prior to Admission medications   Medication Sig Start Date End Date Taking? Authorizing Provider  amoxicillin (AMOXIL) 500 MG capsule Take 1 capsule (500 mg total) by mouth 3 (three) times daily. 11/09/19   Daleen Bo, MD  aspirin EC 81 MG tablet Take 81 mg by mouth at bedtime.    [provider]  atorvastatin (LIPITOR) 80 MG tablet Take 1 tablet (80 mg total) by mouth at bedtime. 10/05/19   Dunn, Nedra Hai, PA-C  clopidogrel (PLAVIX) 75 MG tablet Take 1 tablet (75 mg total) by mouth at bedtime. 10/05/19  Dunn, Dayna N, PA-C  finasteride (PROSCAR) 5 MG tablet Take 5 mg by mouth at bedtime.     [provider]  Fluticasone-Salmeterol (ADVAIR DISKUS) 100-50 MCG/DOSE AEPB Inhale 1 puff into the lungs 2 (two) times daily as needed (shortness).    [provider]  metoprolol succinate (TOPROL-XL) 50 MG 24 hr tablet Take 1 tablet (50 mg total) by mouth daily. Take with or immediately following a meal. 10/05/19 01/03/20  Dunn, Nedra Hai, PA-C  Multiple  Vitamin (MULTIVITAMIN WITH MINERALS) TABS tablet Take 1 tablet by mouth at bedtime. Centrum silver men    [provider]  nitroGLYCERIN (NITROSTAT) 0.4 MG SL tablet Place 1 tablet (0.4 mg total) under the tongue every 5 (five) minutes as needed for chest pain. 10/05/19   Dunn, Nedra Hai, PA-C  tamsulosin (FLOMAX) 0.4 MG CAPS Take 0.8 mg by mouth at bedtime.     [provider]     Family History  Problem Relation Age of Onset  . Heart attack Father   . CAD Father   . Pancreatic cancer Father   . Emphysema Maternal Grandfather        smoked heavily   . Stomach cancer Paternal Grandmother   . Prostate cancer Paternal Uncle     Social History   Socioeconomic History  . Marital status: Divorced    Spouse name: Not on file  . Number of children: 2  . Years of education: Not on file  . Highest education level: Not on file  Occupational History  . Occupation: Retired  Tobacco Use  . Smoking status: Former Smoker    Packs/day: 1.00    Years: 50.00    Pack years: 50.00    Types: Cigarettes    Quit date: 06/01/2012    Years since quitting: 7.7  . Smokeless tobacco: Never Used  Vaping Use  . Vaping Use: Never used  Substance and Sexual Activity  . Alcohol use: Yes    Comment: 3 beers daily  . Drug use: No  . Sexual activity: Not on file  Other Topics Concern  . Not on file  Social History Narrative   Patient lives with his daughter   Social Determinants of Health   Financial Resource Strain: Not on file  Food Insecurity: Not on file  Transportation Needs: Not on file  Physical Activity: Not on file  Stress: Not on file  Social Connections: Not on file    Review of Systems  Review of Systems: A 12 point ROS discussed and pertinent positives are indicated in the HPI above.  All other systems are negative.  Physical Exam No direct physical exam was performed (except for noted visual exam findings with Video Visits).   Vital Signs: There were no vitals  taken for this visit.  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 PSA = 1.5. EXAM: MR PROSTATE WITHOUT AND WITH CONTRAST TECHNIQUE: Multiplanar multisequence MRI images were obtained of the pelvis centered about the prostate. Pre and post contrast images were obtained. CONTRAST:  20mL MULTIHANCE GADOBENATE DIMEGLUMINE 529 MG/ML IV SOLN COMPARISON:  Prostate MRI 01/10/2019 FINDINGS: Prostate: The peripheral zone is thinned by the enlarged transitional zone. No signal abnormality within the peripheral zone on T2 weighted imaging (series 8). No foci of restricted diffusion within the peripheral zone (series 7). The transitional zone is enlarged by capsulated nodules. No suspicious imaging findings on T2 weighted imaging. No suspicious contrast enhancement  Seminal vesicles are normal.  Prostatic capsule is intact. Volume: 6.5 x 5.3 by 4.4 cm (volume = 79 cm^3) Transcapsular spread:  Absent Seminal vesicle involvement: Absent Neurovascular bundle involvement: Absent Pelvic adenopathy: Absent Bone metastasis: Absent Other findings: Aortic stent graft noted. IMPRESSION: 1. No high-grade carcinoma in the peripheral zone.  PI-RADS: 1 2. Enlarged nodular transitional zone most consistent benign prostate hypertrophy. PI-RADS: 2 Electronically Signed   By: Suzy Bouchard M.D.   On: 03/06/2020 13:45   CT Angio Abd/Pel w/ and/or w/o  Result Date: 03/09/2020 CLINICAL DATA:  75 year old male, previous endovascular repair infrarenal abdominal aortic aneurysm, treated for type 2 endoleak 06/30/2019 EXAM: CTA ABDOMEN AND PELVIS WITHOUT AND WITH CONTRAST TECHNIQUE: Multidetector CT imaging of the abdomen and pelvis was performed using the standard protocol during bolus administration of intravenous contrast. Multiplanar reconstructed images and MIPs were obtained and reviewed to evaluate the vascular anatomy. CONTRAST:  153mL OMNIPAQUE IOHEXOL 350  MG/ML SOLN COMPARISON:  05/18/2019 FINDINGS: VASCULAR Aorta: Mild atherosclerotic changes of the distal thoracic aorta. Atherosclerotic changes of the abdominal aorta. Redemonstration of EVAR as treatment for infrarenal abdominal aortic aneurysm, with infrarenal fixation. No evidence of migration of the stent graft. Additionally there are changes of embolization of the prior type 2 endoleak as well as accessory left renal artery. The excluded aneurysm sac is unchanged in maximum diameter from the pre embolization CT, approximately 5.6 cm. No inflammatory changes or periaortic fluid. Delayed images demonstrate no evidence of ongoing endoleak. Celiac: Patent, with no significant atherosclerotic changes. SMA: Patent, with no significant atherosclerotic changes. Renals: - Right: Single right renal artery. Mild atherosclerotic changes at the origin without evidence of high-grade stenosis or occlusion. - Left: The main left renal artery is patent without significant atherosclerotic changes. Interval embolization of the accessory left renal artery. IMA: IMA has been excluded, as well as liquid embolic entering the ostium. Right lower extremity: The right iliac limb remains patent. Limb terminates within the common iliac artery. Hypogastric artery is patent. External iliac artery patent with mild atherosclerosis. Common femoral artery patent with minimal atherosclerosis. Unremarkable appearance of the inguinal soft tissues. Proximal profunda femoris and SFA patent. Left lower extremity: The left iliac limb is patent, with the device delivered from the left. Left limb terminates in the common iliac artery. Hypogastric artery is patent. External iliac artery patent with mild atherosclerosis. Common femoral artery patent with mild atherosclerosis. Unremarkable appearance of the inguinal soft tissues. Proximal profunda femoris and SFA patent. Veins: Unremarkable appearance of the venous system. Review of the MIP images confirms  the above findings. NON-VASCULAR Lower chest: No acute. Hepatobiliary: Subcentimeter hypodensity within segment 2 of the liver, most likely a benign biliary cyst though incompletely characterized. Unremarkable gallbladder. Pancreas: Unremarkable. Spleen: Unremarkable. Adrenals/Urinary Tract: - Right adrenal gland: Unremarkable - Left adrenal gland: Unremarkable. - Right kidney: No hydronephrosis, nephrolithiasis, inflammation, or ureteral dilation. Unchanged appearance of relatively hypodense lesion within the lateral cortex of the right kidney likely benign cyst. Additional hypodense lesion on the anterior cortex, favored to be benign cyst. - Left Kidney: No hydronephrosis. Nonobstructing stone in the inferior collecting system of the left kidney. Similar appearance of geographic hypoperfusion of the inferior left kidney secondary to excluded accessory artery. - Urinary Bladder: Urinary bladder partially distended. No inflammatory changes. Stomach/Bowel: - Stomach: Unremarkable. - Small bowel: Unremarkable - Appendix: Normal. - Colon: Diverticular disease. No inflammatory changes. No evidence of bowel obstruction. Lymphatic: No adenopathy. Mesenteric: No free fluid or air. No mesenteric adenopathy. Reproductive:  Transverse diameter of the prostate measures 57 mm. Other: Bilateral fat containing inguinal hernia. Fat containing umbilical hernia. Musculoskeletal: Degenerative changes of the spine. No bony canal narrowing. No acute displaced fracture. IMPRESSION: No persisting endoleak, status post embolization. The estimated diameter of the excluded aneurysm sac is unchanged from the pre embolization CT study. Redemonstration of EVAR from the left with infrarenal fixation, and no complicating features. Aortic Atherosclerosis (ICD10-I70.0). Prostatomegaly. Additional ancillary findings as above. Signed, Dulcy Fanny. Dellia Nims, RPVI [facility] Radiology Electronically  Signed   By: Corrie Mckusick D.O.   On: 03/09/2020 12:21    Labs:  CBC: Recent Labs    06/30/19 0655 11/13/19 2046  WBC 7.0 8.5  HGB 17.1* 14.5  HCT 50.0 42.8  PLT 142* 171    COAGS: Recent Labs    06/30/19 0655 11/13/19 2046  INR 1.0 1.1    BMP: Recent Labs    06/30/19 0655 03/09/20 1011  NA 139  --   K 4.1  --   CL 105  --   CO2 22  --   GLUCOSE 112*  --   BUN 24*  --   CALCIUM 9.2  --   CREATININE 1.25* 1.00  GFRNONAA 57*  --   GFRAA >60  --     LIVER FUNCTION TESTS: No results for input(s): BILITOT, AST, ALT, ALKPHOS, PROT, ALBUMIN in the last 8760 hours.  TUMOR MARKERS: No results for input(s): AFPTM, CEA, CA199, CHROMGRNA in the last 8760 hours.  Assessment and Plan:  75 year old gentleman doing well 8 months status post endovascular repair of a type II endoleak.  Surveillance imaging demonstrates no evidence of residual endoleak.  The excluded aneurysm sac remains stable in size without interval growth.  1.)  Continue annual surveillance from this point forward.  Repeat CTA of the abdomen and pelvis and accompanying clinic visit in 1 year.  Of note, Juan Lynn has a plan for duplex ultrasound evaluation of the aneurysm with Dr. Carlis Abbott.  If the ultrasound is able to clearly visualize the aneurysm and confirm the absence of endoleak, we could consider deferring additional CT scans in favor of less expensive duplex ultrasound evaluation.    Electronically Signed: Criselda Peaches 03/14/2020, 2:28 PM   I spent a total of 15 Minutes in remote  clinical consultation, greater than 50% of which was counseling/coordinating care for AAA with type 2 endoleak.    Visit type: Audio only (telephone). Audio (no video) only due to patient preference. Alternative for in-person consultation at [facility], [address] [place], [place], [place]. This visit type was conducted due to national recommendations for restrictions regarding the COVID-19 Pandemic (e.g.  social distancing).  This format is felt to be most appropriate for this patient at this time.  All issues noted in this document were discussed and addressed.

## 2020-03-22 DIAGNOSIS — Z8546 Personal history of malignant neoplasm of prostate: Secondary | ICD-10-CM | POA: Diagnosis not present

## 2020-03-22 DIAGNOSIS — R3912 Poor urinary stream: Secondary | ICD-10-CM | POA: Diagnosis not present

## 2020-03-22 DIAGNOSIS — N401 Enlarged prostate with lower urinary tract symptoms: Secondary | ICD-10-CM | POA: Diagnosis not present

## 2020-03-27 ENCOUNTER — Ambulatory Visit (INDEPENDENT_AMBULATORY_CARE_PROVIDER_SITE_OTHER): Payer: Medicare Other | Admitting: Vascular Surgery

## 2020-03-27 ENCOUNTER — Encounter: Payer: Self-pay | Admitting: Vascular Surgery

## 2020-03-27 ENCOUNTER — Ambulatory Visit (HOSPITAL_COMMUNITY)
Admission: RE | Admit: 2020-03-27 | Discharge: 2020-03-27 | Disposition: A | Discharge status: Home / Self Care | Payer: Medicare Other | Source: Ambulatory Visit | Attending: Vascular Surgery | Admitting: Vascular Surgery

## 2020-03-27 ENCOUNTER — Other Ambulatory Visit: Payer: Self-pay

## 2020-03-27 VITALS — BP 128/82 | HR 69 | Temp 97.4°F | Resp 18 | Ht 67.0 in | Wt 188.0 lb

## 2020-03-27 DIAGNOSIS — I714 Abdominal aortic aneurysm, without rupture, unspecified: Secondary | ICD-10-CM

## 2020-03-27 NOTE — Progress Notes (Signed)
Patient name: Juan Lynn MRN: [id number] DOB: 04/17/1945 Sex: male  REASON FOR VISIT: Surveillance of AAA s/p coiling by IR for type II endoleak  Juan Lynn is a 75 y.o. male who presents for follow-up and surveillance of AAA s/p stent graft with type II endloleak.    He had endovascular repair of a 5.2 cm saccular infrarenal aneurysm on 11/11/17.  He had a type II endoleak on CTA at one month post-op and we followed this with interval imaging.  EVAR duplex showed continued growth and a subsequently CTA last year showed that his aneurysm had grown from 5.2 to 5.8 cm.  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    I sent him to IR and he had coiling embolization of the accessory renal and lumbar by Dr. Laurence Ferrari.  This was done on 06/30/2019.  Most recent CTA from 03/09/2020 showed no evidence of residual endoleak.  Aneurysm sac is stable at 5.8 cm with no continued growth.  He reports no other issues.  Past Medical History:  Diagnosis Date  . AAA (abdominal aortic aneurysm) ([place])   . BPH (benign prostatic hyperplasia)   . CAD (coronary artery disease) 06/25/2012   a. Inf STEMI s/p cath- DES-mid RCA b. Echo- EF 55%, basal inferior HK  . Cancer ([place])   . Carotid artery disease ([place])   . COPD (chronic obstructive pulmonary disease) ([place])   . OSA (obstructive sleep apnea)   . Tobacco abuse     Past Surgical History:  Procedure Laterality Date  . ABDOMINAL AORTIC ENDOVASCULAR STENT GRAFT N/A 11/11/2017   Procedure: ABDOMINAL AORTIC ENDOVASCULAR STENT GRAFT;  Surgeon: Marty Heck, MD;  Location: [place];  Service: Vascular;  Laterality: N/A;  . CATARACT EXTRACTION    . CORONARY ANGIOPLASTY WITH STENT PLACEMENT  06/25/2012   100% mid RCA s/p DES, 30% prox AVG Cx, mild luminal irregularities elsewhere; EF 55%  . HERNIA REPAIR    . IR ANGIOGRAM PELVIS SELECTIVE OR SUPRASELECTIVE  06/30/2019  . IR ANGIOGRAM SELECTIVE EACH ADDITIONAL VESSEL   06/30/2019  . IR ANGIOGRAM SELECTIVE EACH ADDITIONAL VESSEL  06/30/2019  . IR ANGIOGRAM SELECTIVE EACH ADDITIONAL VESSEL  06/30/2019  . IR ANGIOGRAM VISCERAL SELECTIVE  06/30/2019  . IR ANGIOGRAM VISCERAL SELECTIVE  06/30/2019  . IR EMBO ARTERIAL NOT HEMORR HEMANG INC GUIDE ROADMAPPING  06/30/2019  . IR RADIOLOGIST EVAL & MGMT  06/01/2019  . IR RADIOLOGIST EVAL & MGMT  07/28/2019  . IR RADIOLOGIST EVAL & MGMT  03/14/2020  . IR US GUIDE VASC ACCESS RIGHT  06/30/2019  . LEFT HEART CATHETERIZATION WITH CORONARY ANGIOGRAM N/A 06/25/2012   Procedure: LEFT HEART CATHETERIZATION WITH CORONARY ANGIOGRAM;  Surgeon: Burnell Blanks, MD;  Location: [facility] CATH LAB;  Service: Cardiovascular;  Laterality: N/A;  . PERCUTANEOUS CORONARY STENT INTERVENTION (PCI-S)  06/25/2012   Procedure: PERCUTANEOUS CORONARY STENT INTERVENTION (PCI-S);  Surgeon: Burnell Blanks, MD;  Location: [facility] CATH LAB;  Service: Cardiovascular;;    Family History  Problem Relation Age of Onset  . Heart attack Father   . CAD Father   . Pancreatic cancer Father   . Emphysema Maternal Grandfather        smoked heavily   . Stomach cancer Paternal Grandmother   . Prostate cancer Paternal Uncle     SOCIAL HISTORY: Social History   Tobacco Use  . Smoking status: Former Smoker    Packs/day: 1.00    Years: 50.00  Pack years: 50.00    Types: Cigarettes    Quit date: 06/01/2012    Years since quitting: 7.8  . Smokeless tobacco: Never Used  Substance Use Topics  . Alcohol use: Yes    Comment: 3 beers daily    No Known Allergies  Current Outpatient Medications  Medication Sig Dispense Refill  . aspirin EC 81 MG tablet Take 81 mg by mouth at bedtime.    Marland Kitchen atorvastatin (LIPITOR) 80 MG tablet Take 1 tablet (80 mg total) by mouth at bedtime. 90 tablet 3  . clopidogrel (PLAVIX) 75 MG tablet Take 1 tablet (75 mg total) by mouth at bedtime. 90 tablet 3  . finasteride (PROSCAR) 5 MG tablet Take 5 mg by mouth at bedtime.     .  Fluticasone-Salmeterol (ADVAIR) 100-50 MCG/DOSE AEPB Inhale 1 puff into the lungs 2 (two) times daily as needed (shortness).    . Multiple Vitamin (MULTIVITAMIN WITH MINERALS) TABS tablet Take 1 tablet by mouth at bedtime. Centrum silver men    . nitroGLYCERIN (NITROSTAT) 0.4 MG SL tablet Place 1 tablet (0.4 mg total) under the tongue every 5 (five) minutes as needed for chest pain. 25 tablet 3  . tamsulosin (FLOMAX) 0.4 MG CAPS Take 0.8 mg by mouth at bedtime.     Marland Kitchen amoxicillin (AMOXIL) 500 MG capsule Take 1 capsule (500 mg total) by mouth 3 (three) times daily. (Patient not taking: Reported on 03/27/2020) 21 capsule 0  . metoprolol succinate (TOPROL-XL) 50 MG 24 hr tablet Take 1 tablet (50 mg total) by mouth daily. Take with or immediately following a meal. 90 tablet 3   No current facility-administered medications for this visit.    REVIEW OF SYSTEMS:  [X]  denotes positive finding, [ ]  denotes negative finding Cardiac  Comments:  Chest pain or chest pressure:    Shortness of breath upon exertion:    Short of breath when lying flat:    Irregular heart rhythm:        Vascular    Pain in calf, thigh, or hip brought on by ambulation:    Pain in feet at night that wakes you up from your sleep:     Blood clot in your veins:    Leg swelling:         Pulmonary    Oxygen at home:    Productive cough:     Wheezing:         Neurologic    Sudden weakness in arms or legs:     Sudden numbness in arms or legs:     Sudden onset of difficulty speaking or slurred speech:    Temporary loss of vision in one eye:     Problems with dizziness:         Gastrointestinal    Blood in stool:     Vomited blood:         Genitourinary    Burning when urinating:     Blood in urine:        Psychiatric    Major depression:         Hematologic    Bleeding problems:    Problems with blood clotting too easily:        Skin    Rashes or ulcers:        Constitutional    Fever or chills:       PHYSICAL EXAM: Vitals:   03/27/20 0944  BP: 128/82  Pulse: 69  Resp: 18  Temp: (!) 97.4  F (36.3 C)  TempSrc: Temporal  SpO2: 96%  Weight: 188 lb (85.3 kg)  Height: 5\' 7"  (1.702 m)    GENERAL: The patient is a well-nourished male, in no acute distress. The vital signs are documented above. CARDIAC: There is a regular rate and rhythm.  VASCULAR:  2+ femoral pulse palpable bilateral grins 2+ DP palpable bilaterally. PULMONARY: No respiratory distress. ABDOMEN: Soft and non-tender.   DATA:   I independently reviewed CTA abdomen pelvis from 03/09/20 and on my review there is good seal proximally and distally.  There are coils in the accessory left renal as well as the lumbar.  I do not see any evidence of a type II endoleak.  Aneurysm sac is stable at 5.8 cm which is the same size it measured prior to coiling.   Assessment/Plan:  75 yo M s/p aortobiiliac endograft (EVAR) for a 5.2 cm saccular infrarenal abdominal aortic aneurysm on 11/11/17.  He had evidence of a type II endoleak and the aneurysm sac grew to 5.8 cm.  He is now status post coil embolization of an accessory left renal artery and lumbar by Dr. Laurence Ferrari with IR which I very much appreciate.  I reviewed his most recent CTA from 03/09/2020 and I agree no evidence of endoleak and stable aneurysm sac diameter of 5.8 cm which is what it measured prior to coiling.  His duplex today would suggest that he has had continued growth, but I do not think this is accurate given he just had a CT 2 weeks ago.  No evidence of endoleak on duplex today.  I think best way to follow this now will be a CT scan which Dr. Laurence Ferrari suggested in 1 year and I agree.  I will see him again in 1 year.  Marty Heck, MD [facility] [place] Office: [place]

## 2020-03-28 DIAGNOSIS — L03032 Cellulitis of left toe: Secondary | ICD-10-CM | POA: Diagnosis not present

## 2020-04-27 ENCOUNTER — Other Ambulatory Visit (HOSPITAL_COMMUNITY): Payer: Self-pay | Admitting: Urology

## 2020-04-27 DIAGNOSIS — N281 Cyst of kidney, acquired: Secondary | ICD-10-CM

## 2020-05-02 ENCOUNTER — Ambulatory Visit (HOSPITAL_COMMUNITY)
Admission: RE | Admit: 2020-05-02 | Discharge: 2020-05-02 | Disposition: A | Discharge status: Home / Self Care | Payer: Medicare Other | Source: Ambulatory Visit | Attending: Urology | Admitting: Urology

## 2020-05-02 ENCOUNTER — Other Ambulatory Visit: Payer: Self-pay

## 2020-05-02 DIAGNOSIS — N2889 Other specified disorders of kidney and ureter: Secondary | ICD-10-CM | POA: Diagnosis not present

## 2020-05-02 DIAGNOSIS — N281 Cyst of kidney, acquired: Secondary | ICD-10-CM | POA: Diagnosis not present

## 2020-06-25 DIAGNOSIS — Z6830 Body mass index (BMI) 30.0-30.9, adult: Secondary | ICD-10-CM | POA: Diagnosis not present

## 2020-06-25 DIAGNOSIS — E669 Obesity, unspecified: Secondary | ICD-10-CM | POA: Diagnosis not present

## 2020-06-25 DIAGNOSIS — Z955 Presence of coronary angioplasty implant and graft: Secondary | ICD-10-CM | POA: Diagnosis not present

## 2020-06-25 DIAGNOSIS — Z8546 Personal history of malignant neoplasm of prostate: Secondary | ICD-10-CM | POA: Diagnosis not present

## 2020-06-25 DIAGNOSIS — Z8679 Personal history of other diseases of the circulatory system: Secondary | ICD-10-CM | POA: Diagnosis not present

## 2020-06-25 DIAGNOSIS — N400 Enlarged prostate without lower urinary tract symptoms: Secondary | ICD-10-CM | POA: Diagnosis not present

## 2020-06-25 DIAGNOSIS — J449 Chronic obstructive pulmonary disease, unspecified: Secondary | ICD-10-CM | POA: Diagnosis not present

## 2020-06-25 DIAGNOSIS — Z86010 Personal history of colonic polyps: Secondary | ICD-10-CM | POA: Diagnosis not present

## 2020-07-25 DIAGNOSIS — R2231 Localized swelling, mass and lump, right upper limb: Secondary | ICD-10-CM | POA: Diagnosis not present

## 2020-07-31 ENCOUNTER — Other Ambulatory Visit: Payer: Self-pay | Admitting: Physician Assistant

## 2020-08-06 ENCOUNTER — Other Ambulatory Visit: Payer: Self-pay

## 2020-08-06 ENCOUNTER — Ambulatory Visit (HOSPITAL_COMMUNITY)
Admission: RE | Admit: 2020-08-06 | Discharge: 2020-08-06 | Disposition: A | Discharge status: Home / Self Care | Payer: Medicare Other | Source: Ambulatory Visit | Attending: Physician Assistant | Admitting: Physician Assistant

## 2020-08-06 DIAGNOSIS — I6523 Occlusion and stenosis of bilateral carotid arteries: Secondary | ICD-10-CM | POA: Insufficient documentation

## 2020-09-04 DIAGNOSIS — Z20822 Contact with and (suspected) exposure to covid-19: Secondary | ICD-10-CM | POA: Diagnosis not present

## 2020-09-10 DIAGNOSIS — G4733 Obstructive sleep apnea (adult) (pediatric): Secondary | ICD-10-CM | POA: Diagnosis not present

## 2020-09-18 DIAGNOSIS — H2512 Age-related nuclear cataract, left eye: Secondary | ICD-10-CM | POA: Diagnosis not present

## 2020-09-18 DIAGNOSIS — H524 Presbyopia: Secondary | ICD-10-CM | POA: Diagnosis not present

## 2020-09-18 DIAGNOSIS — Z961 Presence of intraocular lens: Secondary | ICD-10-CM | POA: Diagnosis not present

## 2020-09-19 DIAGNOSIS — Z8546 Personal history of malignant neoplasm of prostate: Secondary | ICD-10-CM | POA: Diagnosis not present

## 2020-09-25 DIAGNOSIS — Z86010 Personal history of colonic polyps: Secondary | ICD-10-CM | POA: Diagnosis not present

## 2020-10-07 ENCOUNTER — Encounter: Payer: Self-pay | Admitting: Physician Assistant

## 2020-10-07 NOTE — Progress Notes (Signed)
Cardiology Office Note    Date:  10/08/2020   ID:  Juan Lynn, DOB 08/06/1945, MRN [id number]  PCP:  Kathyrn Lass, MD  Cardiologist:  Lauree Chandler, MD  Electrophysiologist:  None   Chief Complaint: f/u CAD  History of Present Illness:   Juan Lynn is a 75 y.o. male with history of CAD (inferior STEMI 06/2012 s/p DES to RCA, mild residual Cx disease), COPD, central sleep apnea (compliant with BiPAP, followed @ [facility]), mild carotid artery disease, former tobacco abuse, prostate cancer and AAA s/p aortoiiliac endovascular repair September 2019 (followed by [facility]) who presents for follow-up. Last echo 11/2017 showed EF 55-60%, grade 1 DD, mild LAE. He is also followed by [facility] for history of AAA repair with known endoleak post-op, s/p subsequent f/u IR repair in 06/2019. In summer 2021 he was having issues with headaches treated by primary care. CT head nonacute, + microvascular changes. Carotid US showed <50% RICA, approximately 70% LICA. Fortunately his headaches have improved without specific intervention. Repeat carotid duplex 08/2020 near normal without significant abnormalities.  He is seen back for follow-up overall doing well without any cardiac complaints. No significant CP, SOB, palpitations, or syncope. He has felt a rare brief twinge of chest pain but no sustained or exertional angina. He has not been monitoring his blood pressure at home regularly but when he did he would see readings in the 488-891 systolic range. He drinks 2-3 drinks nightly. He raised question about whether a prior CT scan would show brain aneurysm - has had 2 family members diagnosed with such. This study was not a CT angio, so he will talk to his primary care doctor about further imaging recommendations. As previously noted, he is comfortable with medical terminology although does not have a medical background. He has questions about duration of his ASA and Plavix and has a colonoscopy coming up  soon.  Labwork independently reviewed: Scan 12/2019 Hgb 16.2, plt 149, CMET OK Tbili 1.1, Cr 1.08, K 4.2,  LDL 62, trig 78   Past Medical History:  Diagnosis Date   AAA (abdominal aortic aneurysm) (HCC)    BPH (benign prostatic hyperplasia)    CAD (coronary artery disease) 06/25/2012   a. Inf STEMI s/p cath- DES-mid RCA b. Echo- EF 55%, basal inferior HK   Cancer (HCC)    Carotid artery disease ([place])    Central sleep apnea    COPD (chronic obstructive pulmonary disease) ([place])    Essential hypertension    Tobacco abuse     Past Surgical History:  Procedure Laterality Date   ABDOMINAL AORTIC ENDOVASCULAR STENT GRAFT N/A 11/11/2017   Procedure: ABDOMINAL AORTIC ENDOVASCULAR STENT GRAFT;  Surgeon: Marty Heck, MD;  Location: [place];  Service: Vascular;  Laterality: N/A;   CATARACT EXTRACTION     CORONARY ANGIOPLASTY WITH STENT PLACEMENT  06/25/2012   100% mid RCA s/p DES, 30% prox AVG Cx, mild luminal irregularities elsewhere; EF 55%   HERNIA REPAIR     IR ANGIOGRAM PELVIS SELECTIVE OR SUPRASELECTIVE  06/30/2019   IR ANGIOGRAM SELECTIVE EACH ADDITIONAL VESSEL  06/30/2019   IR ANGIOGRAM SELECTIVE EACH ADDITIONAL VESSEL  06/30/2019   IR ANGIOGRAM SELECTIVE EACH ADDITIONAL VESSEL  06/30/2019   IR ANGIOGRAM VISCERAL SELECTIVE  06/30/2019   IR ANGIOGRAM VISCERAL SELECTIVE  06/30/2019   IR EMBO ARTERIAL NOT HEMORR HEMANG INC GUIDE ROADMAPPING  06/30/2019   IR RADIOLOGIST EVAL & MGMT  06/01/2019   IR RADIOLOGIST EVAL & MGMT  07/28/2019  IR RADIOLOGIST EVAL & MGMT  03/14/2020   IR US GUIDE VASC ACCESS RIGHT  06/30/2019   LEFT HEART CATHETERIZATION WITH CORONARY ANGIOGRAM N/A 06/25/2012   Procedure: LEFT HEART CATHETERIZATION WITH CORONARY ANGIOGRAM;  Surgeon: Burnell Blanks, MD;  Location[facility] CATH LAB;  Service: Cardiovascular;  Laterality: N/A;   PERCUTANEOUS CORONARY STENT INTERVENTION (PCI-S)  06/25/2012   Procedure: PERCUTANEOUS CORONARY STENT INTERVENTION (PCI-S);  Surgeon:  Burnell Blanks, MD;  Location: [facility] CATH LAB;  Service: Cardiovascular;;    Current Medications: Current Meds  Medication Sig   aspirin EC 81 MG tablet Take 81 mg by mouth at bedtime.   atorvastatin (LIPITOR) 80 MG tablet Take 1 tablet (80 mg total) by mouth at bedtime.   clopidogrel (PLAVIX) 75 MG tablet TAKE 1 TABLET AT BEDTIME   finasteride (PROSCAR) 5 MG tablet Take 5 mg by mouth at bedtime.    Fluticasone-Salmeterol (ADVAIR) 100-50 MCG/DOSE AEPB Inhale 1 puff into the lungs 2 (two) times daily as needed (shortness).   metoprolol succinate (TOPROL-XL) 50 MG 24 hr tablet TAKE 1 TABLET DAILY WITH OR IMMEDIATELY FOLLOWING A MEAL   Multiple Vitamin (MULTIVITAMIN WITH MINERALS) TABS tablet Take 1 tablet by mouth at bedtime. Centrum silver men   nitroGLYCERIN (NITROSTAT) 0.4 MG SL tablet Place 1 tablet (0.4 mg total) under the tongue every 5 (five) minutes as needed for chest pain.   tamsulosin (FLOMAX) 0.4 MG CAPS Take 0.8 mg by mouth at bedtime.       Allergies:   Patient has no known allergies.   Social History   Socioeconomic History   Marital status: Divorced    Spouse name: Not on file   Number of children: 2   Years of education: Not on file   Highest education level: Not on file  Occupational History   Occupation: Retired  Tobacco Use   Smoking status: Former    Packs/day: 1.00    Years: 50.00    Pack years: 50.00    Types: Cigarettes    Quit date: 06/01/2012    Years since quitting: 8.3   Smokeless tobacco: Never  Vaping Use   Vaping Use: Never used  Substance and Sexual Activity   Alcohol use: Yes    Comment: 3 beers daily   Drug use: No   Sexual activity: Not on file  Other Topics Concern   Not on file  Social History Narrative   Patient lives with his daughter   Social Determinants of Health   Financial Resource Strain: Not on file  Food Insecurity: Not on file  Transportation Needs: Not on file  Physical Activity: Not on file  Stress: Not on  file  Social Connections: Not on file     Family History:  The patient's family history includes CAD in his father; Emphysema in his maternal grandfather; Heart attack in his father; Pancreatic cancer in his father; Prostate cancer in his paternal uncle; Stomach cancer in his paternal grandmother.  ROS:   Please see the history of present illness.  All other systems are reviewed and otherwise negative.    EKGs/Labs/Other Studies Reviewed:    Studies reviewed are outlined and summarized above. Reports included below if pertinent.   Cath 06/2012 Procedure Performed: Left Heart Catheterization Selective Coronary Angiography Left ventricular angiogram Aspiration Thrombectomy RCA PTCA/DES x 1 mid RCA Angioseal RFA   Operator: Lauree Chandler, MD   Indication: 75 yo male with history of COPD, tobacco abuse, OSA who is transferred via EMS with acute  inferior STEMI. Pain began at 9am this morning. Code STEMI called 12:52pm. Pt with ongoing chest pain upon arrival at [facility].                                        Procedure Details: The risks, benefits, complications, treatment options, and expected outcomes were discussed with the patient. The patient and/or family concurred with the proposed plan, giving informed verbal consent. Emergency consent documented. The patient was brought to the cath lab via EMS. The patient was not sedated due to hypotension. The right groin was prepped and draped in the usual manner. Using the modified Seldinger access technique, a 6 French sheath was placed in the right femoral artery. A JL4 catheter was used to engage the left main artery. Selective angiopraphy of the left coronary system was performed. I then engaged the RCA with a 6 [place] JR4 guiding catheter. The RCA was found to be totally occluded in the mid vessel. He was given a bolus of Angiomax and a drip was started. He was given 60 mg Effient po x 1. When the ACT was greater than 200, I passed a BMW  wire down the RCA and beyond the total occlusion. Flow was re-established with the wire. I then used a 2.5 x 15 mm balloon to pre-dilate the stenosis. I then passed a Terumo Priority One aspiration catheter into the mid RCA and extracted a large red thrombus. The haziness in the mid RCA improved after thrombectomy. I then carefully positioned a 3.0 x 24 mm Promus Premier in the mid RCA and deployed it at 14 atm. The stent was post-dilated with a 3.5 x 20 mm [place] balloon x 1. There was an excellent result. The stenosis was taken from 100% down to 0%.  A pigtail catheter was used to perform a left ventricular angiogram. An Angioseal femoral artery closure device was placed in the right femoral artery.   The case was complicated by relative hypotension and bradycardia. He was given atropine x 1.   There were no immediate procedural complications. The patient was taken to the CCU in stable condition.   Hemodynamic Findings: Central aortic pressure: 108/66 Left ventricular pressure: 107/9/16   Angiographic Findings:   Left main:  No obstructive disease.   Left Anterior Descending Artery: Large caliber vessel that courses to the apex. Mild luminal irregularities in the mid vessel.   Circumflex Artery: Large caliber vessel with termination into a obtuse marginal branch. 30% proximal AV groove Circumflex stenosis. Small intermediate branch   Right Coronary Artery: Large dominant vessel with 100% mid occlusion.   Left Ventricular Angiogram: LVEF=55%   Impression: 1. Inferior STEMI secondary to occluded mid RCA 2. Successful PTCA/DES x 1 mid RCA 3. Mild disease LAD and Circumflex 4. Preserved LV systolic function   Recommendations: Will admit to CCU. He will be continued on on ASA and Effient for one year. Will start beta blocker and statin. If BP tolerates, start Ace-inh in am. Echo in am. No plans for d/c home before Sunday.       Complications:  None. The patient tolerated the procedure well.         2D Echo 11/2017  - Left ventricle: The cavity size was normal. Systolic function was    normal. The estimated ejection fraction was in the range of 55%    to 60%. Wall motion was normal; there were  no regional wall    motion abnormalities. Doppler parameters are consistent with    abnormal left ventricular relaxation (grade 1 diastolic    dysfunction).  - Left atrium: The atrium was mildly dilated.      Carotid artery [place] 08/2020 Summary:  Right Carotid: The extracranial vessels were near-normal with only minimal  wall                 thickening or plaque.   Left Carotid: The extracranial vessels were near-normal with only minimal  wall                thickening or plaque.   Vertebrals:  Bilateral vertebral arteries demonstrate antegrade flow.  Subclavians: Normal flow hemodynamics were seen in bilateral subclavian               arteries.   *See table(s) above for measurements and observations.        EKG:  EKG is ordered today, personally reviewed, demonstrating NSR 70bpm, no acute STT changes  Recent Labs: 11/13/2019: Hemoglobin 14.5; Platelets 171 03/09/2020: Creatinine, Ser 1.00  Recent Lipid Panel    Component Value Date/Time   CHOL 121 11/10/2016 0000   TRIG 115 11/10/2016 0000   HDL 47 11/10/2016 0000   CHOLHDL 2.6 11/10/2016 0000   CHOLHDL 2 08/11/2012 0924   VLDL 16.4 08/11/2012 0924   LDLCALC 51 11/10/2016 0000    PHYSICAL EXAM:    VS:  BP 140/80   Pulse 70   Ht 5' 7.5" (1.715 m)   Wt 192 lb 12.8 oz (87.5 kg)   SpO2 94%   BMI 29.75 kg/m   BMI: Body mass index is 29.75 kg/m.  GEN: Well nourished, well developed male in no acute distress HEENT: normocephalic, atraumatic Neck: no JVD, carotid bruits, or masses Cardiac: RRR; no murmurs, rubs, or gallops, no edema  Respiratory:  clear to auscultation bilaterally, normal work of breathing GI: soft, rounded, nontender, nondistended, + BS MS: no deformity or atrophy Skin: warm and dry, no rash Neuro:   Alert and Oriented x 3, Strength and sensation are intact, follows commands Psych: euthymic mood, full affect  Wt Readings from Last 3 Encounters:  10/08/20 192 lb 12.8 oz (87.5 kg)  03/27/20 188 lb (85.3 kg)  11/13/19 180 lb (81.6 kg)     ASSESSMENT & PLAN:   1. CAD - doing well without angina. He plans to gradually increase his physical activity. Continue ASA, Plavix, atorvastatin, and metoprolol. He inquired about the duration of his DAPT. Dr. Angelena Form has maintained him on this long term but I will review with him. He is not having any issues on this regimen. We did discuss cutting down ETOH (drinks 2-3 drinks nightly). We will update his CBC (given DAPT), CMET, lipid panel today.  2. AAA - followed by vascular surgery, with next follow-up due 03/2021.  3. Essential HTN  - Suboptimal blood pressure control noted today. We need a better idea of what it's running at home. The patient was provided instructions on monitoring blood pressure at home for 1 week and relaying results to our office. If this remains elevated, would consider changing Toprol to carvedilol or adding amlodipine.  4. Mild carotid artery disease - near normal by recent scan. We reviewed his report today. Given his history of prior carotid stenosis would consider repeating study in 2 years. Continue statin. Check CMET/lipids today.  5. Pre-procedural cardiovascular exam - he is due for upcoming colonoscopy and anticipates clearance will  be sent soon to come off Plavix for this, but timing is TBD (Dr. Alessandra Bevels). He is clinically stable to proceed medically from my standpoint. I will send a message to Dr. Angelena Form to verify if he can come off Plavix for the procedure.  Disposition: F/u with Dr. Angelena Form in 1 year.   Medication Adjustments/Labs and Tests Ordered: Current medicines are reviewed at length with the patient today.  Concerns regarding medicines are outlined above. Medication changes, Labs and Tests ordered today  are summarized above and listed in the Patient Instructions accessible in Encounters.   Signed, Charlie Pitter, PA-C  10/08/2020 10:11 AM    [address], [place], [place]  [address] Phone: [phone]; Fax: [phone]

## 2020-10-08 ENCOUNTER — Encounter: Payer: Self-pay | Admitting: Physician Assistant

## 2020-10-08 ENCOUNTER — Ambulatory Visit (INDEPENDENT_AMBULATORY_CARE_PROVIDER_SITE_OTHER): Payer: Medicare Other | Admitting: Physician Assistant

## 2020-10-08 ENCOUNTER — Telehealth: Payer: Self-pay | Admitting: Physician Assistant

## 2020-10-08 ENCOUNTER — Other Ambulatory Visit: Payer: Self-pay

## 2020-10-08 VITALS — BP 140/80 | HR 70 | Ht 67.5 in | Wt 192.8 lb

## 2020-10-08 DIAGNOSIS — I779 Disorder of arteries and arterioles, unspecified: Secondary | ICD-10-CM

## 2020-10-08 DIAGNOSIS — I714 Abdominal aortic aneurysm, without rupture, unspecified: Secondary | ICD-10-CM

## 2020-10-08 DIAGNOSIS — I6523 Occlusion and stenosis of bilateral carotid arteries: Secondary | ICD-10-CM | POA: Diagnosis not present

## 2020-10-08 DIAGNOSIS — I10 Essential (primary) hypertension: Secondary | ICD-10-CM

## 2020-10-08 DIAGNOSIS — I2510 Atherosclerotic heart disease of native coronary artery without angina pectoris: Secondary | ICD-10-CM

## 2020-10-08 LAB — CBC
Hematocrit: 47.9 % (ref 37.5–51.0)
Hemoglobin: 16.6 g/dL (ref 13.0–17.7)
MCH: 33.2 pg — ABNORMAL HIGH (ref 26.6–33.0)
MCHC: 34.7 g/dL (ref 31.5–35.7)
MCV: 96 fL (ref 79–97)
Platelets: 136 10*3/uL — ABNORMAL LOW (ref 150–450)
RBC: 5 x10E6/uL (ref 4.14–5.80)
RDW: 11.8 % (ref 11.6–15.4)
WBC: 5.5 10*3/uL (ref 3.4–10.8)

## 2020-10-08 LAB — COMPREHENSIVE METABOLIC PANEL
ALT: 19 IU/L (ref 0–44)
AST: 27 IU/L (ref 0–40)
Albumin/Globulin Ratio: 1.5 (ref 1.2–2.2)
Albumin: 4.4 g/dL (ref 3.7–4.7)
Alkaline Phosphatase: 113 IU/L (ref 44–121)
BUN/Creatinine Ratio: 15 (ref 10–24)
BUN: 19 mg/dL (ref 8–27)
Bilirubin Total: 1.3 mg/dL — ABNORMAL HIGH (ref 0.0–1.2)
CO2: 23 mmol/L (ref 20–29)
Calcium: 9.4 mg/dL (ref 8.6–10.2)
Chloride: 102 mmol/L (ref 96–106)
Creatinine, Ser: 1.29 mg/dL — ABNORMAL HIGH (ref 0.76–1.27)
Globulin, Total: 3 g/dL (ref 1.5–4.5)
Glucose: 108 mg/dL — ABNORMAL HIGH (ref 65–99)
Potassium: 4.3 mmol/L (ref 3.5–5.2)
Sodium: 138 mmol/L (ref 134–144)
Total Protein: 7.4 g/dL (ref 6.0–8.5)
eGFR: 58 mL/min/{1.73_m2} — ABNORMAL LOW (ref >59)

## 2020-10-08 LAB — LIPID PANEL
Chol/HDL Ratio: 2.4 ratio (ref 0.0–5.0)
Cholesterol, Total: 137 mg/dL (ref 100–199)
HDL: 56 mg/dL (ref >39)
LDL Chol Calc (NIH): 62 mg/dL (ref 0–99)
Triglycerides: 105 mg/dL (ref 0–149)
VLDL Cholesterol Cal: 19 mg/dL (ref 5–40)

## 2020-10-08 LAB — TSH: TSH: 1.78 u[IU]/mL (ref 0.450–4.500)

## 2020-10-08 NOTE — Telephone Encounter (Signed)
   Visit Follow-up / Please let patient know:  Per discussion with Dr. Angelena Form, he is OK with the patient stopping one of his blood thinners if he wishes - would stop ASA and continue clopidogrel/Plavix.  Also to document for when clearance comes through in the near future, Dr. Angelena Form confirmed he would be OK with the patient holding Plavix for colonoscopy.   OK to let him know this in one phone call when you call him with lab results.  Danicia Terhaar PA-C

## 2020-10-08 NOTE — Patient Instructions (Addendum)
Medication Instructions:  Your physician recommends that you continue on your current medications as directed. Please refer to the Current Medication list given to you today.  *If you need a refill on your cardiac medications before your next appointment, please call your pharmacy*   Lab Work: TODAY:  CMET, CBC, TSH & LIPID  If you have labs (blood work) drawn today and your tests are completely normal, you will receive your results only by: [place] (if you have MyChart) OR A paper copy in the mail If you have any lab test that is abnormal or we need to change your treatment, we will call you to review the results.   Testing/Procedures: None ordered   Follow-Up: At [facility], you and your health needs are our priority.  As part of our continuing mission to provide you with exceptional heart care, we have created designated Provider Care Teams.  These Care Teams include your primary Cardiologist (physician) and Advanced Practice Providers (APPs -  Physician Assistants and Nurse Practitioners) who all work together to provide you with the care you need, when you need it.  We recommend signing up for the patient portal called "MyChart".  Sign up information is provided on this After Visit Summary.  MyChart is used to connect with patients for Virtual Visits (Telemedicine).  Patients are able to view lab/test results, encounter notes, upcoming appointments, etc.  Non-urgent messages can be sent to your provider as well.   To learn more about what you can do with MyChart, go to [email or web address].    Your next appointment:   12 month(s)  The format for your next appointment:   In Person  Provider:   You may see Lauree Chandler, MD or one of the following Advanced Practice Providers on your designated Care Team:   Melina Copa, PA-C Ermalinda Barrios, PA-C   Other Instructions  We need to get a better idea of what your blood pressure is running at home. Here are some  instructions to follow: - I would recommend using a blood pressure cuff that goes on your arm. The wrist ones can be inaccurate. If you're purchasing one for the first time, try to select one that also reports your heart rate because this can be helpful information as well. - To check your blood pressure, choose a time at least 3 hours after taking your blood pressure medicines. If you can sample it at different times of the day, that's great - it might give you more information about how your blood pressure fluctuates. Remain seated in a chair for 5 minutes quietly beforehand, then check it.  - Please record a list of those readings and call us/send in MyChart message with them for our review on Thursday.

## 2020-10-08 NOTE — Telephone Encounter (Signed)
Pt has been made aware that he is ok to stop his Aspirin and continue with the Plavix. He was appreciative of the call.

## 2020-10-12 NOTE — Telephone Encounter (Signed)
Please let pt know these blood pressures generally look OK but a few outliers above our goal. (Goal top # less than 130.) I would work on decreasing salt and alcohol intake, and submit a week's worth of readings for our review when he can. If we are finding he is still regularly getting readings over 130 on the top number, we may need to add an additional medication.  EKG showed normal rhythm with no changes compared to prior, which is good.  We are unable to text him as it is not HIPAA compliant. Thanks. Sending back to triage as Blima Rich is out today. Mykaila Blunck PA-C

## 2020-10-30 ENCOUNTER — Other Ambulatory Visit (HOSPITAL_COMMUNITY): Payer: Self-pay | Admitting: Family Medicine

## 2020-10-30 ENCOUNTER — Other Ambulatory Visit: Payer: Self-pay | Admitting: Family Medicine

## 2020-10-30 DIAGNOSIS — I6529 Occlusion and stenosis of unspecified carotid artery: Secondary | ICD-10-CM

## 2020-10-30 DIAGNOSIS — Z8249 Family history of ischemic heart disease and other diseases of the circulatory system: Secondary | ICD-10-CM

## 2020-10-30 DIAGNOSIS — U071 COVID-19: Secondary | ICD-10-CM | POA: Diagnosis not present

## 2020-10-30 DIAGNOSIS — S91209A Unspecified open wound of unspecified toe(s) with damage to nail, initial encounter: Secondary | ICD-10-CM | POA: Diagnosis not present

## 2020-10-30 DIAGNOSIS — K1370 Unspecified lesions of oral mucosa: Secondary | ICD-10-CM | POA: Diagnosis not present

## 2020-11-12 ENCOUNTER — Ambulatory Visit: Payer: Medicare Other | Admitting: Podiatry

## 2020-11-14 DIAGNOSIS — H2512 Age-related nuclear cataract, left eye: Secondary | ICD-10-CM | POA: Diagnosis not present

## 2020-11-14 DIAGNOSIS — H25042 Posterior subcapsular polar age-related cataract, left eye: Secondary | ICD-10-CM | POA: Diagnosis not present

## 2020-11-15 ENCOUNTER — Ambulatory Visit (INDEPENDENT_AMBULATORY_CARE_PROVIDER_SITE_OTHER): Payer: Medicare Other | Admitting: Podiatry

## 2020-11-15 ENCOUNTER — Other Ambulatory Visit: Payer: Self-pay

## 2020-11-15 ENCOUNTER — Encounter: Payer: Self-pay | Admitting: Podiatry

## 2020-11-15 DIAGNOSIS — I6523 Occlusion and stenosis of bilateral carotid arteries: Secondary | ICD-10-CM

## 2020-11-15 DIAGNOSIS — L600 Ingrowing nail: Secondary | ICD-10-CM | POA: Diagnosis not present

## 2020-11-15 NOTE — Progress Notes (Signed)
Subjective:   Patient ID: Juan Lynn, male   DOB: 75 y.o.   MRN: [id number]   HPI Patient states that she has a very damaged big toenail on his left foot that is been a problem for a long time and makes it hard for him to wear shoe gear.  Tries to trim it and work on it and without relief of symptoms and states has become more more of a problem over time   Review of Systems  All other systems reviewed and are negative.      Objective:  Physical Exam Vitals and nursing note reviewed.  Constitutional:      Appearance: He is well-developed.  Pulmonary:     Effort: Pulmonary effort is normal.  Musculoskeletal:        General: Normal range of motion.  Skin:    General: Skin is warm.  Neurological:     Mental Status: He is alert.    Neurovascular status found to be intact muscle strength was found to be adequate range of motion adequate.  Patient is noted to have a severely thickened damaged left hallux nail that is dystrophic painful when pressed and make shoe gear difficult.  Patient has good digital perfusion well oriented x3     Assessment:  Severe elongation with thickness of the left hallux nail with damage to the nailbed with pain     Plan:  H&P reviewed condition and recommended the utilization of conservative treatment.  Patient wants to go ahead and have this nail removed I explained procedure risk and patient wants surgery.  I discussed what will be required and explained the surgical intervention in this case and explained to him surgical procedure.  At this point I infiltrated the left hallux 60 mg like Marcaine mixture sterile prep and using sterile instrumentation remove the hallux nail exposed matrix applied phenol 5 applications 30 seconds followed by alcohol lavage sterile dressing and gave instructions on soaks and leave dressing on 24 hours but take it off earlier if any throbbing were to occur.  Encouraged to call with questions

## 2020-11-15 NOTE — Patient Instructions (Signed)

## 2020-11-23 ENCOUNTER — Other Ambulatory Visit: Payer: Self-pay

## 2020-11-23 ENCOUNTER — Ambulatory Visit (INDEPENDENT_AMBULATORY_CARE_PROVIDER_SITE_OTHER): Payer: Medicare Other | Admitting: Podiatry

## 2020-11-23 ENCOUNTER — Encounter: Payer: Self-pay | Admitting: Podiatry

## 2020-11-23 DIAGNOSIS — L03032 Cellulitis of left toe: Secondary | ICD-10-CM

## 2020-11-23 DIAGNOSIS — L03031 Cellulitis of right toe: Secondary | ICD-10-CM | POA: Insufficient documentation

## 2020-11-23 DIAGNOSIS — I6523 Occlusion and stenosis of bilateral carotid arteries: Secondary | ICD-10-CM

## 2020-11-23 NOTE — Progress Notes (Signed)
Subjective:   Patient ID: Juan Lynn, male   DOB: 75 y.o.   MRN: [id number]   HPI Patient presents concerned about some redness and drainage around the left hallux nail that was removed and wants to make sure it is okay and there is no infection    ROS      Objective:  Physical Exam  Neurovascular status was found to be intact muscle strength adequate with the left hallux nail bed showing good health with crusted tissue slight redness noted more proximal localized to this area     Assessment:  Low-grade paronychia infection left hallux no indications of proximal spread     Plan:  H&P condition reviewed and I discussed continued soaks and bandaging possibility for antibiotics educated him on what to do if further redness or active drainage were to occur.  I am hoping this will heal uneventfully and patient is encouraged to come in if any issues were to occur     Left ear

## 2020-11-27 DIAGNOSIS — H2512 Age-related nuclear cataract, left eye: Secondary | ICD-10-CM | POA: Diagnosis not present

## 2020-11-27 DIAGNOSIS — H25042 Posterior subcapsular polar age-related cataract, left eye: Secondary | ICD-10-CM | POA: Diagnosis not present

## 2020-11-27 DIAGNOSIS — H25812 Combined forms of age-related cataract, left eye: Secondary | ICD-10-CM | POA: Diagnosis not present

## 2020-11-29 ENCOUNTER — Other Ambulatory Visit: Payer: Self-pay | Admitting: Physician Assistant

## 2021-01-02 ENCOUNTER — Telehealth: Payer: Self-pay | Admitting: *Deleted

## 2021-01-02 NOTE — Telephone Encounter (Signed)
   Pre-operative Risk Assessment    Patient Name: Juan Lynn  DOB: [birth year] MRN: [id number]      Request for Surgical Clearance   Procedure:   COLONOSCOPY  Date of Surgery: Clearance 01/07/21 PER CLEARANCE REQUEST STAT                                Surgeon:  DR. Alessandra Bevels Surgeon's Group or Practice Name:  [facility] Phone number:  [phone] Fax number:  [phone]   Type of Clearance Requested: - Medical  - Pharmacy:  Hold Clopidogrel (Plavix)     Type of Anesthesia:   PROPOFOL   Additional requests/questions:   Jiles Prows   01/02/2021, 1:30 PM

## 2021-01-02 NOTE — Telephone Encounter (Signed)
   Name: Juan Lynn  DOB: 1946/02/04  MRN: [id number]   Primary Cardiologist: Lauree Chandler, MD  Chart reviewed as part of pre-operative protocol coverage. Patient was contacted 01/02/2021 in reference to pre-operative risk assessment for pending surgery as outlined below.  Juan Lynn was last seen on 10/08/20 by [facility].  He can complete more than 4.0 METS without angina.   Per Dr. Angelena Form, patient may hold plavix for colonoscopy.   Therefore, based on ACC/AHA guidelines, the patient would be at acceptable risk for the planned procedure without further cardiovascular testing.   The patient was advised that if he develops new symptoms prior to surgery to contact our office to arrange for a follow-up visit, and he verbalized understanding.  I will route this recommendation to the requesting party via Epic fax function and remove from pre-op pool. Please call with questions.  Tami Lin Adeyemi Hamad, PA 01/02/2021, 1:51 PM

## 2021-01-07 DIAGNOSIS — K648 Other hemorrhoids: Secondary | ICD-10-CM | POA: Diagnosis not present

## 2021-01-07 DIAGNOSIS — K635 Polyp of colon: Secondary | ICD-10-CM | POA: Diagnosis not present

## 2021-01-07 DIAGNOSIS — Z86010 Personal history of colonic polyps: Secondary | ICD-10-CM | POA: Diagnosis not present

## 2021-01-07 DIAGNOSIS — K5730 Diverticulosis of large intestine without perforation or abscess without bleeding: Secondary | ICD-10-CM | POA: Diagnosis not present

## 2021-01-09 DIAGNOSIS — K635 Polyp of colon: Secondary | ICD-10-CM | POA: Diagnosis not present

## 2021-01-10 ENCOUNTER — Other Ambulatory Visit (HOSPITAL_COMMUNITY): Payer: Self-pay | Admitting: Family Medicine

## 2021-01-10 ENCOUNTER — Other Ambulatory Visit: Payer: Self-pay | Admitting: Family Medicine

## 2021-01-10 DIAGNOSIS — Z1389 Encounter for screening for other disorder: Secondary | ICD-10-CM | POA: Diagnosis not present

## 2021-01-10 DIAGNOSIS — Z8249 Family history of ischemic heart disease and other diseases of the circulatory system: Secondary | ICD-10-CM

## 2021-01-10 DIAGNOSIS — Z0000 Encounter for general adult medical examination without abnormal findings: Secondary | ICD-10-CM | POA: Diagnosis not present

## 2021-01-14 ENCOUNTER — Ambulatory Visit (INDEPENDENT_AMBULATORY_CARE_PROVIDER_SITE_OTHER): Payer: Medicare Other | Admitting: Podiatry

## 2021-01-14 ENCOUNTER — Other Ambulatory Visit: Payer: Self-pay

## 2021-01-14 ENCOUNTER — Encounter: Payer: Self-pay | Admitting: Podiatry

## 2021-01-14 DIAGNOSIS — L600 Ingrowing nail: Secondary | ICD-10-CM

## 2021-01-14 DIAGNOSIS — I6523 Occlusion and stenosis of bilateral carotid arteries: Secondary | ICD-10-CM

## 2021-01-14 DIAGNOSIS — L03032 Cellulitis of left toe: Secondary | ICD-10-CM

## 2021-01-14 NOTE — Progress Notes (Signed)
Subjective:   Patient ID: Juan Lynn, male   DOB: 75 y.o.   MRN: [id number]   HPI Patient presents concerned about crusted tissue on top of his left big toenail and wants to know if there could still be infection present.  Also is concerned it could be further ingrown toenail deformity with only mild pain neuro   ROS      Objective:  Physical Exam  Vascular status intact with crusted tissue dorsal left hallux after having had previous permanent nail removal with no distal pathology no proximal edema erythema or drainage noted     Assessment:  Appears to be mostly a scab type tissue formation does not appear currently to have any infective process     Plan:  Education rendered discussed that this should come off naturally and I do not recommend forcing the issue even though I did offer to numb the toe and do this.  At this point he will continue to treat this carefully and will be seen back if any of the following erythema edema or drainage were to

## 2021-01-15 DIAGNOSIS — D3709 Neoplasm of uncertain behavior of other specified sites of the oral cavity: Secondary | ICD-10-CM | POA: Diagnosis not present

## 2021-02-04 ENCOUNTER — Other Ambulatory Visit: Payer: Self-pay

## 2021-02-04 DIAGNOSIS — Z87891 Personal history of nicotine dependence: Secondary | ICD-10-CM

## 2021-02-08 DIAGNOSIS — L570 Actinic keratosis: Secondary | ICD-10-CM | POA: Diagnosis not present

## 2021-02-08 DIAGNOSIS — X32XXXD Exposure to sunlight, subsequent encounter: Secondary | ICD-10-CM | POA: Diagnosis not present

## 2021-02-08 DIAGNOSIS — L821 Other seborrheic keratosis: Secondary | ICD-10-CM | POA: Diagnosis not present

## 2021-02-19 ENCOUNTER — Other Ambulatory Visit: Payer: Self-pay

## 2021-02-19 ENCOUNTER — Encounter: Payer: Self-pay | Admitting: Acute Care

## 2021-02-19 ENCOUNTER — Ambulatory Visit (HOSPITAL_BASED_OUTPATIENT_CLINIC_OR_DEPARTMENT_OTHER)
Admission: RE | Admit: 2021-02-19 | Discharge: 2021-02-19 | Disposition: A | Discharge status: Home / Self Care | Payer: Medicare Other | Source: Ambulatory Visit | Attending: Acute Care | Admitting: Acute Care

## 2021-02-19 ENCOUNTER — Ambulatory Visit (INDEPENDENT_AMBULATORY_CARE_PROVIDER_SITE_OTHER): Payer: Medicare Other | Admitting: Acute Care

## 2021-02-19 DIAGNOSIS — Z87891 Personal history of nicotine dependence: Secondary | ICD-10-CM

## 2021-02-19 DIAGNOSIS — Z122 Encounter for screening for malignant neoplasm of respiratory organs: Secondary | ICD-10-CM | POA: Insufficient documentation

## 2021-02-19 DIAGNOSIS — J439 Emphysema, unspecified: Secondary | ICD-10-CM | POA: Diagnosis not present

## 2021-02-19 DIAGNOSIS — I700 Atherosclerosis of aorta: Secondary | ICD-10-CM | POA: Insufficient documentation

## 2021-02-19 NOTE — Progress Notes (Signed)
Virtual Visit via Telephone Note  I connected with Juan Lynn on 01/15/21 at  2:00 PM EST by telephone and verified that I am speaking with the correct person using two identifiers.  Location: Patient: Home Provider: Working from home   I discussed the limitations, risks, security and privacy concerns of performing an evaluation and management service by telephone and the availability of in person appointments. I also discussed with the patient that there may be a patient responsible charge related to this service. The patient expressed understanding and agreed to proceed.  Shared Decision Making Visit Lung Cancer Screening Program [phone])   Eligibility: Age 75 y.o. Pack Years Smoking History Calculation 55 (# packs/per year x # years smoked) Recent History of coughing up blood  no Unexplained weight loss? no ( >Than 15 pounds within the last 6 months ) Prior History Lung / other cancer no (Diagnosis within the last 5 years already requiring surveillance chest CT Scans). Smoking Status Former Smoker Former Smokers: Years since quit: 3 years  Quit Date: 2019  Visit Components: Discussion included one or more decision making aids. yes Discussion included risk/benefits of screening. yes Discussion included potential follow up diagnostic testing for abnormal scans. yes Discussion included meaning and risk of over diagnosis. yes Discussion included meaning and risk of False Positives. yes Discussion included meaning of total radiation exposure. yes  Counseling Included: Importance of adherence to annual lung cancer LDCT screening. yes Impact of comorbidities on ability to participate in the program. yes Ability and willingness to under diagnostic treatment. yes  Smoking Cessation Counseling: Current Smokers:  Discussed importance of smoking cessation. yes Information about tobacco cessation classes and interventions provided to patient. yes Patient provided with "ticket"  for LDCT Scan. yes Symptomatic Patient. no  Counseling NA Diagnosis Code: Tobacco Use Z72.0 Asymptomatic Patient no  Counseling  NA Former Smokers:  Discussed the importance of maintaining cigarette abstinence. yes Diagnosis Code: Personal History of Nicotine Dependence. K02.542 Information about tobacco cessation classes and interventions provided to patient. Yes Patient provided with "ticket" for LDCT Scan. yes Written Order for Lung Cancer Screening with LDCT placed in Epic. Yes (CT Chest Lung Cancer Screening Low Dose W/O CM) [id number] Z12.2-Screening of respiratory organs Z87.891-Personal history of nicotine dependence   I spent 25 minutes of face to face time with him discussing the risks and benefits of lung cancer screening. We viewed a power point together that explained in detail the above noted topics. We took the time to pause the power point at intervals to allow for questions to be asked and answered to ensure understanding. We discussed that he had taken the single most powerful action possible to decrease his risk of developing lung cancer when he quit smoking. I counseled him to remain smoke free, and to contact me if he ever had the desire to smoke again so that I can provide resources and tools to help support the effort to remain smoke free. We discussed the time and location of the scan, and that either  Doroteo Glassman RN or I will call with the results within  24-48 hours of receiving them. He has my card and contact information in the event he needs to speak with me, in addition to a copy of the power point we reviewed as a resource. He verbalized understanding of all of the above and had no further questions upon leaving the office.     I explained to the patient that there has been a high incidence of  coronary artery disease noted on these exams. I explained that this is a non-gated exam therefore degree or severity cannot be determined. This patient is on statin therapy. I  have asked the patient to follow-up with their PCP regarding any incidental finding of coronary artery disease and management with diet or medication as they feel is clinically indicated. The patient verbalized understanding of the above and had no further questions.  Mykayla Brinton D. Kenton Kingfisher, NP-C [facility] Personal contact information can be found on Amion  02/19/2021, 8:43 AM

## 2021-02-19 NOTE — Patient Instructions (Signed)
Thank you for participating in the [facility]. °It was our pleasure to meet you today. °We will call you with the results of your scan within the next few days. °Your scan will be assigned a Lung RADS category score by the physicians reading the scans.  °This Lung RADS score determines follow up scanning.  °See below for description of categories, and follow up screening recommendations. °We will be in touch to schedule your follow up screening annually or based on recommendations of our providers. °We will fax a copy of your scan results to your Primary Care Physician, or the physician who referred you to the program, to ensure they have the results. °Please call the office if you have any questions or concerns regarding your scanning experience or results.  °Our office number is [phone]. °Please speak with Denise Phelps, RN. She is our Lung Cancer Screening RN. °If she is unavailable when you call, please have the office staff send her a message. She will return your call at her earliest convenience. °Remember, if your scan is normal, we will scan you annually as long as you continue to meet the criteria for the program. (Age 55-77, Current smoker or smoker who has quit within the last 15 years). °If you are a smoker, remember, quitting is the single most powerful action that you can take to decrease your risk of lung cancer and other pulmonary, breathing related problems. °We know quitting is hard, and we are here to help.  °Please let us know if there is anything we can do to help you meet your goal of quitting. °If you are a former smoker, congratulations. We are proud of you! Remain smoke free! °Remember you can refer friends or family members through the number above.  °We will screen them to make sure they meet criteria for the program. °Thank you for helping us take better care of you by participating in Lung Screening. ° °You can receive free nicotine replacement therapy  ( patches, gum or mints) by calling 1-800-QUIT NOW. Please call so we can get you on the path to becoming  a non-smoker. I know it is hard, but you can do this! ° °Lung RADS Categories: ° °Lung RADS 1: no nodules or definitely non-concerning nodules.  °Recommendation is for a repeat annual scan in 12 months. ° °Lung RADS 2:  nodules that are non-concerning in appearance and behavior with a very low likelihood of becoming an active cancer. °Recommendation is for a repeat annual scan in 12 months. ° °Lung RADS 3: nodules that are probably non-concerning , includes nodules with a low likelihood of becoming an active cancer.  Recommendation is for a 6-month repeat screening scan. Often noted after an upper respiratory illness. We will be in touch to make sure you have no questions, and to schedule your 6-month scan. ° °Lung RADS 4 A: nodules with concerning findings, recommendation is most often for a follow up scan in 3 months or additional testing based on our provider's assessment of the scan. We will be in touch to make sure you have no questions and to schedule the recommended 3 month follow up scan. ° °Lung RADS 4 B:  indicates findings that are concerning. We will be in touch with you to schedule additional diagnostic testing based on our provider's  assessment of the scan. ° °Hypnosis for smoking cessation  °[employer] °[phone] ° °Acupuncture for smoking cessation  °[facility] °[phone]  °

## 2021-02-20 ENCOUNTER — Other Ambulatory Visit: Payer: Self-pay

## 2021-02-20 DIAGNOSIS — I714 Abdominal aortic aneurysm, without rupture, unspecified: Secondary | ICD-10-CM

## 2021-02-21 ENCOUNTER — Other Ambulatory Visit: Payer: Self-pay | Admitting: Acute Care

## 2021-02-21 DIAGNOSIS — Z87891 Personal history of nicotine dependence: Secondary | ICD-10-CM

## 2021-03-11 ENCOUNTER — Other Ambulatory Visit: Payer: Self-pay | Admitting: Interventional Radiology

## 2021-03-11 DIAGNOSIS — I9789 Other postprocedural complications and disorders of the circulatory system, not elsewhere classified: Secondary | ICD-10-CM

## 2021-03-15 ENCOUNTER — Ambulatory Visit
Admission: RE | Admit: 2021-03-15 | Discharge: 2021-03-15 | Disposition: A | Discharge status: Home / Self Care | Payer: Medicare Other | Source: Ambulatory Visit | Attending: Vascular Surgery | Admitting: Vascular Surgery

## 2021-03-15 DIAGNOSIS — I714 Abdominal aortic aneurysm, without rupture, unspecified: Secondary | ICD-10-CM

## 2021-03-15 DIAGNOSIS — I723 Aneurysm of iliac artery: Secondary | ICD-10-CM | POA: Diagnosis not present

## 2021-03-15 DIAGNOSIS — N200 Calculus of kidney: Secondary | ICD-10-CM | POA: Diagnosis not present

## 2021-03-15 DIAGNOSIS — N400 Enlarged prostate without lower urinary tract symptoms: Secondary | ICD-10-CM | POA: Diagnosis not present

## 2021-03-15 DIAGNOSIS — K429 Umbilical hernia without obstruction or gangrene: Secondary | ICD-10-CM | POA: Diagnosis not present

## 2021-03-15 MED ORDER — IOPAMIDOL (ISOVUE-370) INJECTION 76%
75.0000 mL | Freq: Once | INTRAVENOUS | Status: AC | PRN
  Administered 2021-03-15: 75 mL via INTRAVENOUS

## 2021-03-26 ENCOUNTER — Ambulatory Visit: Payer: Medicare Other | Admitting: Diagnostic Neuroimaging

## 2021-04-03 ENCOUNTER — Encounter: Payer: Self-pay | Admitting: *Deleted

## 2021-04-03 ENCOUNTER — Other Ambulatory Visit: Payer: Self-pay

## 2021-04-03 ENCOUNTER — Ambulatory Visit
Admission: RE | Admit: 2021-04-03 | Discharge: 2021-04-03 | Disposition: A | Discharge status: Home / Self Care | Payer: Medicare Other | Source: Ambulatory Visit | Attending: Interventional Radiology | Admitting: Interventional Radiology

## 2021-04-03 DIAGNOSIS — R030 Elevated blood-pressure reading, without diagnosis of hypertension: Secondary | ICD-10-CM | POA: Diagnosis not present

## 2021-04-03 DIAGNOSIS — G4733 Obstructive sleep apnea (adult) (pediatric): Secondary | ICD-10-CM | POA: Diagnosis not present

## 2021-04-03 DIAGNOSIS — I714 Abdominal aortic aneurysm, without rupture, unspecified: Secondary | ICD-10-CM | POA: Diagnosis not present

## 2021-04-03 DIAGNOSIS — K8689 Other specified diseases of pancreas: Secondary | ICD-10-CM | POA: Diagnosis not present

## 2021-04-03 DIAGNOSIS — I9789 Other postprocedural complications and disorders of the circulatory system, not elsewhere classified: Secondary | ICD-10-CM

## 2021-04-03 HISTORY — PX: IR RADIOLOGIST EVAL & MGMT: IMG5224

## 2021-04-03 NOTE — Progress Notes (Addendum)
Chief Complaint: Patient was consulted remotely today (TeleHealth) for AAA with type 2 endoleak at the request of Suhailah Kwan K.    Referring Physician(s): Dr. Monica Martinez  History of Present Illness: Juan Lynn is a 76 y.o. 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   [name] underwent endovascular repair On 07/01/2019.  He was found to have inflow via the right L4 lumbar artery and outflow via an accessory artery to the lower pole of the left kidney.  His procedure was a success with coil embolization of both the outflow and inflow arteries as well as liquid embolic embolization of the Endosac itself.  Today we meet over the phone.    He is doing well.  He denies any abdominal or flank pain.  I asked him specifically about any left upper quadrant or left flank pain given the CT findings of mild fullness in the pancreatic tail.  He asserts that he has no such symptoms.  Follow-up CTA imaging dated 03/16/2011 demonstrates perhaps incremental enlargement of the excluded aneurysm sac compared to 03/09/2020 with a possible mild residual type II endoleak along the superior aspect of the aneurysm sac seen only on the delayed images.  I evaluated his images closely and the size difference is quite minimal and likely within the realm of measurement error.  Additionally, there is mild fullness of the pancreatic tail which represents an interval change compared to prior imaging.  This is more concerning given Juan Lynn history of his father and a first cousin passing away from pancreatic cancer.   Past Medical History:  Diagnosis Date   AAA (abdominal aortic aneurysm)    BPH (benign prostatic hyperplasia)    CAD (coronary artery disease) 06/25/2012   a. Inf STEMI s/p cath- DES-mid RCA b. Echo- EF 55%, basal inferior HK   Cancer (HCC)     Carotid artery disease ([place])    Central sleep apnea    COPD (chronic obstructive pulmonary disease) ([place])    Essential hypertension    Tobacco abuse     Past Surgical History:  Procedure Laterality Date   ABDOMINAL AORTIC ENDOVASCULAR STENT GRAFT N/A 11/11/2017   Procedure: ABDOMINAL AORTIC ENDOVASCULAR STENT GRAFT;  Surgeon: Marty Heck, MD;  Location: [facility];  Service: Vascular;  Laterality: N/A;   CATARACT EXTRACTION     CORONARY ANGIOPLASTY WITH STENT PLACEMENT  06/25/2012   100% mid RCA s/p DES, 30% prox AVG Cx, mild luminal irregularities elsewhere; EF 55%   HERNIA REPAIR     IR ANGIOGRAM PELVIS SELECTIVE OR SUPRASELECTIVE  06/30/2019   IR ANGIOGRAM SELECTIVE EACH ADDITIONAL VESSEL  06/30/2019   IR ANGIOGRAM SELECTIVE EACH ADDITIONAL VESSEL  06/30/2019   IR ANGIOGRAM SELECTIVE EACH ADDITIONAL VESSEL  06/30/2019   IR ANGIOGRAM VISCERAL SELECTIVE  06/30/2019   IR ANGIOGRAM VISCERAL SELECTIVE  06/30/2019   IR EMBO ARTERIAL NOT HEMORR HEMANG INC GUIDE ROADMAPPING  06/30/2019   IR RADIOLOGIST EVAL & MGMT  06/01/2019   IR RADIOLOGIST EVAL & MGMT  07/28/2019   IR RADIOLOGIST EVAL & MGMT  03/14/2020   IR US GUIDE VASC ACCESS RIGHT  06/30/2019   LEFT HEART CATHETERIZATION WITH CORONARY ANGIOGRAM N/A 06/25/2012   Procedure: LEFT HEART CATHETERIZATION WITH CORONARY ANGIOGRAM;  Surgeon: Burnell Blanks, MD;  Location: [facility] CATH LAB;  Service: Cardiovascular;  Laterality: N/A;   PERCUTANEOUS CORONARY STENT  INTERVENTION (PCI-S)  06/25/2012   Procedure: PERCUTANEOUS CORONARY STENT INTERVENTION (PCI-S);  Surgeon: Burnell Blanks, MD;  Location[facility] C[facility] LAB;  Service: Cardiovascular;;    Allergies: Patient has no known allergies.  Medications: Prior to Admission medications   Medication Sig Start Date End Date Taking? Authorizing Provider  atorvastatin (LIPITOR) 80 MG tablet TAKE 1 TABLET AT BEDTIME 11/29/20   Burnell Blanks, MD  atorvastatin (LIPITOR) 80 MG tablet 1  tablet    [provider]  clopidogrel (PLAVIX) 75 MG tablet TAKE 1 TABLET AT BEDTIME 08/01/20   Belva Crome, MD  finasteride (PROSCAR) 5 MG tablet Take 5 mg by mouth at bedtime.     [provider]  Fluticasone-Salmeterol (ADVAIR) 100-50 MCG/DOSE AEPB Inhale 1 puff into the lungs 2 (two) times daily as needed (shortness).    [provider]  LAGEVRIO 200 MG CAPS capsule Take by mouth. 10/30/20   [provider]  metoprolol succinate (TOPROL-XL) 50 MG 24 hr tablet TAKE 1 TABLET DAILY WITH OR IMMEDIATELY FOLLOWING A MEAL 08/01/20   Belva Crome, MD  moxifloxacin (VIGAMOX) 0.5 % ophthalmic solution  11/15/20   [provider]  Multiple Vitamin (MULTIVITAMIN WITH MINERALS) TABS tablet Take 1 tablet by mouth at bedtime. Centrum silver men    [provider]  nitroGLYCERIN (NITROSTAT) 0.4 MG SL tablet Place 1 tablet (0.4 mg total) under the tongue every 5 (five) minutes as needed for chest pain. 10/05/19   Dunn, Nedra Hai, PA-C  tamsulosin (FLOMAX) 0.4 MG CAPS Take 0.8 mg by mouth at bedtime.     [provider]     Family History  Problem Relation Age of Onset   Heart attack Father    CAD Father    Pancreatic cancer Father    Emphysema Maternal Grandfather        smoked heavily    Stomach cancer Paternal Grandmother    Prostate cancer Paternal Uncle     Social History   Socioeconomic History   Marital status: Divorced    Spouse name: Not on file   Number of children: 2   Years of education: Not on file   Highest education level: Not on file  Occupational History   Occupation: Retired  Tobacco Use   Smoking status: Former    Packs/day: 1.00    Years: 55.00    Pack years: 55.00    Types: Cigarettes    Quit date: 2019    Years since quitting: 4.0   Smokeless tobacco: Never  Vaping Use   Vaping Use: Never used  Substance and Sexual Activity   Alcohol use: Yes    Comment: 3 beers daily   Drug use: No   Sexual activity:  Not on file  Other Topics Concern   Not on file  Social History Narrative   Patient lives with his daughter   Social Determinants of Health   Financial Resource Strain: Not on file  Food Insecurity: Not on file  Transportation Needs: Not on file  Physical Activity: Not on file  Stress: Not on file  Social Connections: Not on file    Review of Systems  Review of Systems: A 12 point ROS discussed and pertinent positives are indicated in the HPI above.  All other systems are negative.  Physical Exam No direct physical exam was performed (except for noted visual exam findings with Video Visits).    Vital Signs: There were no vitals taken for this visit.  Imaging:  CT ANGIO ABDOMEN PELVIS  W &/OR WO CONTRAST  Result Date: 03/15/2021 CLINICAL DATA:  Abdominal aortic aneurysm surveillance. Previous treatment for type 2 endoleak. EXAM: CT ANGIOGRAPHY ABDOMEN AND PELVIS WITH CONTRAST AND WITHOUT CONTRAST TECHNIQUE: Multidetector CT imaging of the abdomen and pelvis was performed using the standard protocol during bolus administration of intravenous contrast. Multiplanar reconstructed images and MIPs were obtained and reviewed to evaluate the vascular anatomy. CONTRAST:  65mL ISOVUE-370 IOPAMIDOL (ISOVUE-370) INJECTION 76% COMPARISON:  03/09/2020 FINDINGS: VASCULAR Aorta: Endovascular repair of the abdominal aortic aneurysm with a bifurcated aortic stent graft. The aortic graft is patent. Stable placement of the aorta graft positioned just below the right renal artery. There is high-density material along the right side of the sac related to liquid embolic embolization and there are embolization coils involving the accessory left renal artery, origin of the IMA and a right lumbar artery. Aneurysm sac measures 5.9 x 6.0 cm on sequence 7 image 100 and previously measured 5.6 x 5.9 cm. Configuration of the aneurysm sac has not changed. Concern for an endoleak on the delayed images on sequence 17,  image 39. However, evaluation for endoleak is somewhat limited because there is streak artifact from the previous embolization procedures. Endoleak source is not confidently identified on this examination. Celiac: Patent without evidence of aneurysm, dissection, vasculitis or significant stenosis. SMA: Patent without evidence of aneurysm, dissection, vasculitis or significant stenosis. Renals: Main right renal artery is widely patent without aneurysm, dissection or significant stenosis. Main left renal artery is widely patent without aneurysm, dissection or significant stenosis. The origin and proximal aspect of the accessory left renal artery has been coil embolized. There appears to be retrograde filling of the accessory left renal artery. IMA: Origin of the IMA is embolized. Reconstitution of the IMA branches. Inflow: Stent grafts extend into the common iliac arteries bilaterally. Bilateral limbs are patent. Small amount of mural thrombus in the right limb. Focal aneurysm in the right internal iliac artery measures 1.1 cm and minimally changed. Right internal and external iliac arteries are widely patent. Again noted is focal stenosis at origin of the left internal iliac artery. Left external iliac artery is widely patent. Proximal Outflow: Proximal femoral arteries are patent bilaterally. Veins: Proximal iliac veins and IVC are patent. Normal appearance of the renal veins. Main portal venous system appears to be patent. Review of the MIP images confirms the above findings. NON-VASCULAR Lower chest: Emphysematous changes at the lung bases. No pleural effusions. Hepatobiliary: Normal appearance of the gallbladder. Small hypodensity in the anterior left hepatic lobe is suggestive for a cyst. No significant biliary dilatation. Pancreas: Minimal fullness of the pancreatic region. Subtle stranding in this region adjacent to the pancreas is similar to the previous examination. No discrete lesion in this area. No  evidence for duct dilatation. Spleen: Trace fluid or stranding along the medial aspect of the spleen which is similar to the prior examination. Spleen size is within normal limits. Adrenals/Urinary Tract: Again noted is a peripherally calcified structure in the medial right kidney that measures approximately 2 cm. This has not changed in size. Central aspect of the structure does not show significant enhancement on the arterial phase imaging. Suspect this represents a stable complex cyst. Normal adrenal glands. Again noted is a small stone in left kidney lower pole. Evidence for atrophy in the left kidney lower pole compatible with embolization to the accessory left renal artery. Negative for hydronephrosis. Normal appearance of the urinary bladder. Lateral cyst in the right  kidney measures up to 1.7 cm. Stomach/Bowel: Colonic diverticulosis. No acute bowel inflammation. Normal appearance of the appendix. Normal appearance of the stomach. Lymphatic: No lymph node enlargement in the abdomen or pelvis. Reproductive: Enlarged prostate with calcifications. Other: Bilateral inguinal hernias containing fat. Umbilical hernia containing fat. Negative for ascites. Musculoskeletal: Disc space narrowing with endplate changes at T0-G2. No acute bone abnormality. IMPRESSION: VASCULAR 1. Aortic aneurysm sac has minimally enlarged since 03/09/2020. Aortic aneurysm sac measures 5.9 x 6.0 cm. The configuration of the aneurysm sac has not changed. There is concern for a type 2 endoleak along the superior aspect of the aneurysm sac seen only on the delayed images. This area is difficult to evaluate due to streak artifact from the previous embolization procedures. 2. Stable position and configuration of the bifurcated aortic stent graft. Previous embolization to the aneurysm sac and aneurysm branches. 3. 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 NON-VASCULAR 1. No acute abnormality in the  abdomen or pelvis. 2. Prostate enlargement. 3. Stable peripherally calcified structure in the right kidney probably represents a complex cyst. 4. Additional ancillary findings which include a nonobstructive left kidney stone, atrophy in the left kidney lower pole, umbilical hernia and inguinal hernias. 5.  Emphysema (ICD10-J43.9). 6. Mild fullness of the pancreatic tail is nonspecific. No discrete lesion is area. Recommend attention to this area on follow up imaging. Electronically Signed   By: Markus Daft M.D.   On: 03/15/2021 17:27    Labs:  CBC: Recent Labs    10/08/20 1040  WBC 5.5  HGB 16.6  HCT 47.9  PLT 136*    COAGS: No results for input(s): INR, APTT in the last 8760 hours.  BMP: Recent Labs    10/08/20 1040  NA 138  K 4.3  CL 102  CO2 23  GLUCOSE 108*  BUN 19  CALCIUM 9.4  CREATININE 1.29*    LIVER FUNCTION TESTS: Recent Labs    10/08/20 1040  BILITOT 1.3*  AST 27  ALT 19  ALKPHOS 113  PROT 7.4  ALBUMIN 4.4    TUMOR MARKERS: No results for input(s): AFPTM, CEA, CA199, CHROMGRNA in the last 8760 hours.  Assessment and Plan:  Pleasant 76 year old gentleman with history of abdominal aortic aneurysm with type II endoleak status post endovascular repair.  There was a question of incremental enlargement of the excluded aneurysm sac on the most recent CT evaluation.  However, after reviewing the studies myself, I feel that the measured difference is with in the spectrum of interobserver variability and does not constitute real growth.  We can continue with annual surveillance imaging to evaluate for growth over time.  However, there is a new finding of increased fullness within the pancreatic tail which is concerning, particularly given Mr. Lengacher strong family history of pancreatic cancer.  I would like to pursue this with a more aggressive follow-up evaluation.  1.)  Pancreas protocol MRI of the abdomen with gadolinium contrast in 3 months to assess possible  pancreatic tail lesion given strong family history of pancreatic cancer.  I will also ask my staff to arrange a telephone conference following the MRI study so that I can review the results with Mr. Mauger.  2.)  Repeat CT arteriogram of the abdomen and pelvis and clinic visit in 1 year.   Electronically Signed: Criselda Peaches 04/03/2021, 3:38 PM   I spent a total of  10 Minutes in remote  clinical consultation, greater than 50% of which  was counseling/coordinating care for AAA with type 2 endoleak.    Visit type: Audio only (telephone). Audio (no video) only due to patient preference. Alternative for in-person consultation at [facility], [place] [address] [place], [place], [place]. This visit type was conducted due to national recommendations for restrictions regarding the COVID-19 Pandemic (e.g. social distancing).  This format is felt to be most appropriate for this patient at this time.  All issues noted in this document were discussed and addressed.

## 2021-04-04 DIAGNOSIS — Z8546 Personal history of malignant neoplasm of prostate: Secondary | ICD-10-CM | POA: Diagnosis not present

## 2021-04-11 ENCOUNTER — Other Ambulatory Visit: Payer: Self-pay | Admitting: *Deleted

## 2021-04-11 ENCOUNTER — Encounter: Payer: Self-pay | Admitting: *Deleted

## 2021-04-11 DIAGNOSIS — Z8546 Personal history of malignant neoplasm of prostate: Secondary | ICD-10-CM | POA: Diagnosis not present

## 2021-04-11 DIAGNOSIS — N400 Enlarged prostate without lower urinary tract symptoms: Secondary | ICD-10-CM | POA: Diagnosis not present

## 2021-04-15 ENCOUNTER — Telehealth: Payer: Self-pay | Admitting: Diagnostic Neuroimaging

## 2021-04-15 ENCOUNTER — Other Ambulatory Visit: Payer: Self-pay

## 2021-04-15 ENCOUNTER — Encounter: Payer: Self-pay | Admitting: Diagnostic Neuroimaging

## 2021-04-15 ENCOUNTER — Ambulatory Visit (INDEPENDENT_AMBULATORY_CARE_PROVIDER_SITE_OTHER): Payer: Medicare Other | Admitting: Diagnostic Neuroimaging

## 2021-04-15 VITALS — BP 137/78 | HR 64 | Ht 67.0 in | Wt 194.0 lb

## 2021-04-15 DIAGNOSIS — Z8249 Family history of ischemic heart disease and other diseases of the circulatory system: Secondary | ICD-10-CM | POA: Diagnosis not present

## 2021-04-15 DIAGNOSIS — R519 Headache, unspecified: Secondary | ICD-10-CM | POA: Diagnosis not present

## 2021-04-15 DIAGNOSIS — I671 Cerebral aneurysm, nonruptured: Secondary | ICD-10-CM | POA: Diagnosis not present

## 2021-04-15 NOTE — Progress Notes (Signed)
[facility]  PATIENT: Juan Lynn DOB: [birth year]  REFERRING CLINICIAN: Kathyrn Lass, MD HISTORY FROM: patient  REASON FOR VISIT: new consult    HISTORICAL  CHIEF COMPLAINT:  Chief Complaint  Patient presents with   Advice Only    Rm 6  Pt is well, here for consult regarding family history aneurysm     HISTORY OF PRESENT ILLNESS:   76 year old male here for evaluation of brain aneurysm screening.  Patient's brother was recently diagnosed with ruptured cerebral aneurysm.  Another brother his daughter also was diagnosed with 2 brain aneurysms.  Patient's sister also diagnosed with brain aneurysm.  Patient also has personal history of abdominal aortic aneurysm status post treatment.  Patient has history of hypertension and prior tobacco abuse.  Patient has stopped smoking.  Patient has had some headaches in the past.   REVIEW OF SYSTEMS: Full 14 system review of systems performed and negative with exception of: as per HPI.  ALLERGIES: No Known Allergies  HOME MEDICATIONS: Outpatient Medications Prior to Visit  Medication Sig Dispense Refill   atorvastatin (LIPITOR) 80 MG tablet TAKE 1 TABLET AT BEDTIME 90 tablet 3   atorvastatin (LIPITOR) 80 MG tablet 1 tablet     clopidogrel (PLAVIX) 75 MG tablet TAKE 1 TABLET AT BEDTIME 90 tablet 3   finasteride (PROSCAR) 5 MG tablet Take 5 mg by mouth at bedtime.      Fluticasone-Salmeterol (ADVAIR) 100-50 MCG/DOSE AEPB Inhale 1 puff into the lungs 2 (two) times daily as needed (shortness).     metoprolol succinate (TOPROL-XL) 50 MG 24 hr tablet TAKE 1 TABLET DAILY WITH OR IMMEDIATELY FOLLOWING A MEAL 90 tablet 3   Multiple Vitamin (MULTIVITAMIN WITH MINERALS) TABS tablet Take 1 tablet by mouth at bedtime. Centrum silver men     nitroGLYCERIN (NITROSTAT) 0.4 MG SL tablet Place 1 tablet (0.4 mg total) under the tongue every 5 (five) minutes as needed for chest pain. 25 tablet 3   tamsulosin (FLOMAX) 0.4 MG CAPS  Take 0.8 mg by mouth at bedtime.      LAGEVRIO 200 MG CAPS capsule Take by mouth.     moxifloxacin (VIGAMOX) 0.5 % ophthalmic solution      No facility-administered medications prior to visit.    PAST MEDICAL HISTORY: Past Medical History:  Diagnosis Date   AAA (abdominal aortic aneurysm)    BPH (benign prostatic hyperplasia)    CAD (coronary artery disease) 06/25/2012   a. Inf STEMI s/p cath- DES-mid RCA b. Echo- EF 55%, basal inferior HK   Cancer (HCC)    Carotid artery disease ([place])    Central sleep apnea    COPD (chronic obstructive pulmonary disease) ([place])    Essential hypertension    History of heart attack    Hypercholesterolemia    Prostate cancer ([place])    Tobacco abuse     PAST SURGICAL HISTORY: Past Surgical History:  Procedure Laterality Date   ABDOMINAL AORTIC ENDOVASCULAR STENT GRAFT N/A 11/11/2017   Procedure: ABDOMINAL AORTIC ENDOVASCULAR STENT GRAFT;  Surgeon: Marty Heck, MD;  Location: [facility];  Service: Vascular;  Laterality: N/A;   CATARACT EXTRACTION  11/2019   CORONARY ANGIOPLASTY WITH STENT PLACEMENT  06/25/2012   100% mid RCA s/p DES, 30% prox AVG Cx, mild luminal irregularities elsewhere; EF 55%   HERNIA REPAIR     IR ANGIOGRAM PELVIS SELECTIVE OR SUPRASELECTIVE  06/30/2019   IR ANGIOGRAM SELECTIVE EACH ADDITIONAL VESSEL  06/30/2019   IR ANGIOGRAM SELECTIVE EACH ADDITIONAL VESSEL  06/30/2019   IR ANGIOGRAM SELECTIVE EACH ADDITIONAL VESSEL  06/30/2019   IR ANGIOGRAM VISCERAL SELECTIVE  06/30/2019   IR ANGIOGRAM VISCERAL SELECTIVE  06/30/2019   IR EMBO ARTERIAL NOT HEMORR HEMANG INC GUIDE ROADMAPPING  06/30/2019   IR RADIOLOGIST EVAL & MGMT  06/01/2019   IR RADIOLOGIST EVAL & MGMT  07/28/2019   IR RADIOLOGIST EVAL & MGMT  03/14/2020   IR RADIOLOGIST EVAL & MGMT  04/03/2021   IR US GUIDE VASC ACCESS RIGHT  06/30/2019   LEFT HEART CATHETERIZATION WITH CORONARY ANGIOGRAM N/A 06/25/2012   Procedure: LEFT HEART CATHETERIZATION WITH CORONARY  ANGIOGRAM;  Surgeon: Burnell Blanks, MD;  Location: [facility] CATH LAB;  Service: Cardiovascular;  Laterality: N/A;   PERCUTANEOUS CORONARY STENT INTERVENTION (PCI-S)  06/25/2012   Procedure: PERCUTANEOUS CORONARY STENT INTERVENTION (PCI-S);  Surgeon: Burnell Blanks, MD;  Location: [facility] CATH LAB;  Service: Cardiovascular;;    FAMILY HISTORY: Family History  Problem Relation Age of Onset   Heart attack Father    CAD Father    Pancreatic cancer Father    Aneurysm Sister    Aneurysm Brother    Emphysema Maternal Grandfather        smoked heavily    Stomach cancer Paternal Grandmother    Prostate cancer Paternal Uncle    Aneurysm Niece     SOCIAL HISTORY: Social History   Socioeconomic History   Marital status: Divorced    Spouse name: Not on file   Number of children: 2   Years of education: Not on file   Highest education level: Not on file  Occupational History   Occupation: Retired  Tobacco Use   Smoking status: Former    Packs/day: 1.00    Years: 55.00    Pack years: 55.00    Types: Cigarettes    Quit date: 2019    Years since quitting: 4.1   Smokeless tobacco: Never  Vaping Use   Vaping Use: Never used  Substance and Sexual Activity   Alcohol use: Yes    Comment: 3 beers daily   Drug use: No   Sexual activity: Not on file  Other Topics Concern   Not on file  Social History Narrative   Patient lives with his daughter   Social Determinants of Health   Financial Resource Strain: Not on file  Food Insecurity: Not on file  Transportation Needs: Not on file  Physical Activity: Not on file  Stress: Not on file  Social Connections: Not on file  Intimate Partner Violence: Not on file     PHYSICAL EXAM  GENERAL EXAM/CONSTITUTIONAL: Vitals:  Vitals:   04/15/21 0949  BP: 137/78  Pulse: 64  Weight: 194 lb (88 kg)  Height: 5\' 7"  (1.702 m)   Body mass index is 30.38 kg/m. Wt Readings from Last 3 Encounters:  04/15/21 194 lb (88 kg)  02/19/21  190 lb (86.2 kg)  10/08/20 192 lb 12.8 oz (87.5 kg)   Patient is in no distress; well developed, nourished and groomed; neck is supple  CARDIOVASCULAR: Examination of carotid arteries is normal; no carotid bruits Regular rate and rhythm, no murmurs Examination of peripheral vascular system by observation and palpation is normal  EYES: Ophthalmoscopic exam of optic discs and posterior segments is normal; no papilledema or hemorrhages No results found.  MUSCULOSKELETAL: Gait, strength, tone, movements noted in Neurologic exam below  NEUROLOGIC: MENTAL STATUS:  No flowsheet data found. awake, alert, oriented to person, place and time recent and remote memory intact  normal attention and concentration language fluent, comprehension intact, naming intact fund of knowledge appropriate  CRANIAL NERVE:  2nd - no papilledema on fundoscopic exam 2nd, 3rd, 4th, 6th - pupils equal and reactive to light, visual fields full to confrontation, extraocular muscles intact, no nystagmus 5th - facial sensation symmetric 7th - facial strength symmetric 8th - hearing intact 9th - palate elevates symmetrically, uvula midline 11th - shoulder shrug symmetric 12th - tongue protrusion midline  MOTOR:  normal bulk and tone, full strength in the BUE, BLE  SENSORY:  normal and symmetric to light touch, pinprick, temperature, vibration  COORDINATION:  finger-nose-finger, fine finger movements normal  REFLEXES:  deep tendon reflexes present and symmetric  GAIT/STATION:  narrow based gait; able to walk on toes, heels and tandem; romberg is negative     DIAGNOSTIC DATA (LABS, IMAGING, TESTING) - I reviewed patient records, labs, notes, testing and imaging myself where available.  Lab Results  Component Value Date   WBC 5.5 10/08/2020   HGB 16.6 10/08/2020   HCT 47.9 10/08/2020   MCV 96 10/08/2020   PLT 136 (L) 10/08/2020      Component Value Date/Time   NA 138 10/08/2020 1040   K 4.3  10/08/2020 1040   CL 102 10/08/2020 1040   CO2 23 10/08/2020 1040   GLUCOSE 108 (H) 10/08/2020 1040   GLUCOSE 112 (H) 06/30/2019 0655   BUN 19 10/08/2020 1040   CREATININE 1.29 (H) 10/08/2020 1040   CALCIUM 9.4 10/08/2020 1040   PROT 7.4 10/08/2020 1040   ALBUMIN 4.4 10/08/2020 1040   AST 27 10/08/2020 1040   ALT 19 10/08/2020 1040   ALKPHOS 113 10/08/2020 1040   BILITOT 1.3 (H) 10/08/2020 1040   GFRNONAA 57 (L) 06/30/2019 0655   GFRAA >60 06/30/2019 0655   Lab Results  Component Value Date   CHOL 137 10/08/2020   HDL 56 10/08/2020   LDLCALC 62 10/08/2020   TRIG 105 10/08/2020   CHOLHDL 2.4 10/08/2020   Lab Results  Component Value Date   HGBA1C 5.3 06/25/2012   No results found for: [id number] Lab Results  Component Value Date   TSH 1.780 10/08/2020       ASSESSMENT AND PLAN  76 y.o. year old male here with strong history of brain aneurysm in multiple first-degree family members.  Also with history of tobacco abuse and hypertension.  We will proceed with screening.   Dx:  1. Family history of cerebral aneurysm   2. New onset of headaches after age 34   3. Cerebral aneurysm, nonruptured      PLAN:  - check MRI, MRA - monitor BP and tx per PCP - avoid smoking  Orders Placed This Encounter  Procedures   MR BRAIN W WO CONTRAST   MR ANGIO HEAD WO CONTRAST   Return for pending if symptoms worsen or fail to improve, pending test results.    Penni Bombard, MD 0/93/1121, 62:44 AM Certified in Neurology, Neurophysiology and Neuroimaging  [facility] [address], [place], [place] [address] [phone]

## 2021-04-15 NOTE — Telephone Encounter (Signed)
medicare/tricare order sent to GI, [facility] they will reach out to the patient to schedule.

## 2021-04-15 NOTE — Patient Instructions (Signed)
-   check MRI, MRA head

## 2021-04-16 ENCOUNTER — Ambulatory Visit (INDEPENDENT_AMBULATORY_CARE_PROVIDER_SITE_OTHER): Payer: Medicare Other | Admitting: Vascular Surgery

## 2021-04-16 ENCOUNTER — Other Ambulatory Visit: Payer: Self-pay

## 2021-04-16 ENCOUNTER — Encounter: Payer: Self-pay | Admitting: Vascular Surgery

## 2021-04-16 VITALS — BP 118/78 | HR 95 | Temp 98.2°F | Resp 20 | Ht 67.0 in | Wt 193.5 lb

## 2021-04-16 DIAGNOSIS — I7143 Infrarenal abdominal aortic aneurysm, without rupture: Secondary | ICD-10-CM | POA: Diagnosis not present

## 2021-04-16 NOTE — Progress Notes (Signed)
Patient name: Juan Lynn MRN: [id number] DOB: 09/22/1945 Sex: male  REASON FOR VISIT: 1 year follow-up for surveillance of AAA s/p coiling by IR for type II endoleak  [name] is a 76 y.o. male who presents for follow-up and surveillance of AAA s/p stent graft with type II endloleak.  He had endovascular repair of a 5.2 cm saccular infrarenal aneurysm on 11/11/17.  He had a type II endoleak on CTA at one month post-op and we followed this with interval imaging.  EVAR duplex showed continued growth and a subsequently CTA in 2021 showed that his aneurysm had grown from 5.2 to 5.8 cm.  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  I sent him to IR and he had coiling embolization of the accessory renal and lumbar by Dr. Laurence Ferrari on 06/30/2019.    He has no new concerns today.  No abdominal or back pain.  He was seen last month by Dr. Laurence Ferrari in IR.  Past Medical History:  Diagnosis Date   AAA (abdominal aortic aneurysm)    BPH (benign prostatic hyperplasia)    CAD (coronary artery disease) 06/25/2012   a. Inf STEMI s/p cath- DES-mid RCA b. Echo- EF 55%, basal inferior HK   Cancer (HCC)    Carotid artery disease ([place])    Central sleep apnea    COPD (chronic obstructive pulmonary disease) (HCC)    Essential hypertension    History of heart attack    Hypercholesterolemia    Prostate cancer ([place])    Tobacco abuse     Past Surgical History:  Procedure Laterality Date   ABDOMINAL AORTIC ENDOVASCULAR STENT GRAFT N/A 11/11/2017   Procedure: ABDOMINAL AORTIC ENDOVASCULAR STENT GRAFT;  Surgeon: Marty Heck, MD;  Location: [facility];  Service: Vascular;  Laterality: N/A;   CATARACT EXTRACTION  11/2019   CORONARY ANGIOPLASTY WITH STENT PLACEMENT  06/25/2012   100% mid RCA s/p DES, 30% prox AVG Cx, mild luminal irregularities elsewhere; EF 55%   HERNIA REPAIR     IR ANGIOGRAM PELVIS SELECTIVE OR SUPRASELECTIVE  06/30/2019   IR ANGIOGRAM  SELECTIVE EACH ADDITIONAL VESSEL  06/30/2019   IR ANGIOGRAM SELECTIVE EACH ADDITIONAL VESSEL  06/30/2019   IR ANGIOGRAM SELECTIVE EACH ADDITIONAL VESSEL  06/30/2019   IR ANGIOGRAM VISCERAL SELECTIVE  06/30/2019   IR ANGIOGRAM VISCERAL SELECTIVE  06/30/2019   IR EMBO ARTERIAL NOT HEMORR HEMANG INC GUIDE ROADMAPPING  06/30/2019   IR RADIOLOGIST EVAL & MGMT  06/01/2019   IR RADIOLOGIST EVAL & MGMT  07/28/2019   IR RADIOLOGIST EVAL & MGMT  03/14/2020   IR RADIOLOGIST EVAL & MGMT  04/03/2021   IR US GUIDE VASC ACCESS RIGHT  06/30/2019   LEFT HEART CATHETERIZATION WITH CORONARY ANGIOGRAM N/A 06/25/2012   Procedure: LEFT HEART CATHETERIZATION WITH CORONARY ANGIOGRAM;  Surgeon: Burnell Blanks, MD;  Location: [facility] CATH LAB;  Service: Cardiovascular;  Laterality: N/A;   PERCUTANEOUS CORONARY STENT INTERVENTION (PCI-S)  06/25/2012   Procedure: PERCUTANEOUS CORONARY STENT INTERVENTION (PCI-S);  Surgeon: Burnell Blanks, MD;  Location: [facility] CATH LAB;  Service: Cardiovascular;;    Family History  Problem Relation Age of Onset   Heart attack Father    CAD Father    Pancreatic cancer Father    Aneurysm Sister    Aneurysm Brother    Emphysema Maternal Grandfather        smoked heavily    Stomach cancer Paternal Grandmother    Prostate cancer  Paternal Uncle    Aneurysm Niece     SOCIAL HISTORY: Social History   Tobacco Use   Smoking status: Former    Packs/day: 1.00    Years: 55.00    Pack years: 55.00    Types: Cigarettes    Quit date: 2019    Years since quitting: 4.1    Passive exposure: Never   Smokeless tobacco: Never  Substance Use Topics   Alcohol use: Yes    Comment: 3 beers daily    No Known Allergies  Current Outpatient Medications  Medication Sig Dispense Refill   atorvastatin (LIPITOR) 80 MG tablet TAKE 1 TABLET AT BEDTIME 90 tablet 3   clopidogrel (PLAVIX) 75 MG tablet TAKE 1 TABLET AT BEDTIME 90 tablet 3   finasteride (PROSCAR) 5 MG tablet Take 5 mg by  mouth at bedtime.      Fluticasone-Salmeterol (ADVAIR) 100-50 MCG/DOSE AEPB Inhale 1 puff into the lungs 2 (two) times daily as needed (shortness).     metoprolol succinate (TOPROL-XL) 50 MG 24 hr tablet TAKE 1 TABLET DAILY WITH OR IMMEDIATELY FOLLOWING A MEAL 90 tablet 3   Multiple Vitamin (MULTIVITAMIN WITH MINERALS) TABS tablet Take 1 tablet by mouth at bedtime. Centrum silver men     nitroGLYCERIN (NITROSTAT) 0.4 MG SL tablet Place 1 tablet (0.4 mg total) under the tongue every 5 (five) minutes as needed for chest pain. 25 tablet 3   tamsulosin (FLOMAX) 0.4 MG CAPS Take 0.8 mg by mouth at bedtime.      No current facility-administered medications for this visit.    REVIEW OF SYSTEMS:  [X]  denotes positive finding, [ ]  denotes negative finding Cardiac  Comments:  Chest pain or chest pressure:    Shortness of breath upon exertion:    Short of breath when lying flat:    Irregular heart rhythm:        Vascular    Pain in calf, thigh, or hip brought on by ambulation:    Pain in feet at night that wakes you up from your sleep:     Blood clot in your veins:    Leg swelling:         Pulmonary    Oxygen at home:    Productive cough:     Wheezing:         Neurologic    Sudden weakness in arms or legs:     Sudden numbness in arms or legs:     Sudden onset of difficulty speaking or slurred speech:    Temporary loss of vision in one eye:     Problems with dizziness:         Gastrointestinal    Blood in stool:     Vomited blood:         Genitourinary    Burning when urinating:     Blood in urine:        Psychiatric    Major depression:         Hematologic    Bleeding problems:    Problems with blood clotting too easily:        Skin    Rashes or ulcers:        Constitutional    Fever or chills:      PHYSICAL EXAM: Vitals:   04/16/21 1057  BP: 118/78  Pulse: 95  Resp: 20  Temp: 98.2 F (36.8 C)  TempSrc: Temporal  SpO2: (!) 62%  Weight: 193 lb 8 oz (87.8 kg)  Height: 5\' 7"  (1.702 m)    GENERAL: The patient is a well-nourished male, in no acute distress. The vital signs are documented above. CARDIAC: There is a regular rate and rhythm.  VASCULAR:  2+ femoral pulse palpable bilateral grins 2+ DP palpable bilaterally. PULMONARY: No respiratory distress. ABDOMEN: Soft and non-tender.  No pain with deep palpation of aneurysm.   DATA:   I independently reviewed CTA abdomen pelvis from 03/15/21 and on my review there is good seal proximally and distally.  There are coils in the accessory left renal as well as the lumbar.  I do not see any evidence of a obvious type II endoleak.  Aneurysm sac is 5.9 cm which is very minimal increase over past 1 year and previously measured 5.8 cm.   Assessment/Plan:  76 yo M s/p EVAR for a 5.2 cm saccular infrarenal abdominal aortic aneurysm on 11/11/17.  He had evidence of a type II endoleak and the aneurysm sac grew to 5.8 cm.  He is now status post coil embolization of an accessory left renal artery and lumbar by Dr. Laurence Ferrari with IR on 06/30/19.  His aneurysm last measured 5.8 cm 1 year ago.  He had a repeat CTA abdomen pelvis on 03/15/2021 and there was a concern from the radiologist about a subtle type II endoleak on delayed imaging but difficult to evaluate with streak artifact from previous coils.  I see very little change in his aneurysm size and measure it from 5.8 to 5.9 cm over the past year.  I agree with Dr. Laurence Ferrari with IR that we can safely continue observation for now and I will see him in 1 year with a repeat CTA abdomen pelvis.  Marty Heck, MD [facility] Office: [place]

## 2021-04-24 DIAGNOSIS — D3709 Neoplasm of uncertain behavior of other specified sites of the oral cavity: Secondary | ICD-10-CM | POA: Diagnosis not present

## 2021-04-26 ENCOUNTER — Ambulatory Visit
Admission: RE | Admit: 2021-04-26 | Discharge: 2021-04-26 | Disposition: A | Discharge status: Home / Self Care | Payer: Medicare Other | Source: Ambulatory Visit | Attending: Diagnostic Neuroimaging | Admitting: Diagnostic Neuroimaging

## 2021-04-26 ENCOUNTER — Other Ambulatory Visit: Payer: Self-pay

## 2021-04-26 DIAGNOSIS — Z8249 Family history of ischemic heart disease and other diseases of the circulatory system: Secondary | ICD-10-CM

## 2021-04-26 DIAGNOSIS — R519 Headache, unspecified: Secondary | ICD-10-CM | POA: Diagnosis not present

## 2021-04-26 DIAGNOSIS — I671 Cerebral aneurysm, nonruptured: Secondary | ICD-10-CM

## 2021-04-26 MED ORDER — GADOBENATE DIMEGLUMINE 529 MG/ML IV SOLN
18.0000 mL | Freq: Once | INTRAVENOUS | Status: AC | PRN
Start: 2021-04-26 — End: 2021-04-26
  Administered 2021-04-26: 18 mL via INTRAVENOUS

## 2021-06-18 ENCOUNTER — Other Ambulatory Visit: Payer: Self-pay | Admitting: Interventional Radiology

## 2021-06-18 DIAGNOSIS — I9789 Other postprocedural complications and disorders of the circulatory system, not elsewhere classified: Secondary | ICD-10-CM

## 2021-07-04 DIAGNOSIS — Z20822 Contact with and (suspected) exposure to covid-19: Secondary | ICD-10-CM | POA: Diagnosis not present

## 2021-07-05 ENCOUNTER — Other Ambulatory Visit: Payer: Self-pay | Admitting: Interventional Radiology

## 2021-07-05 ENCOUNTER — Ambulatory Visit (HOSPITAL_COMMUNITY)
Admission: RE | Admit: 2021-07-05 | Discharge: 2021-07-05 | Disposition: A | Discharge status: Home / Self Care | Payer: Medicare Other | Source: Ambulatory Visit | Attending: Interventional Radiology | Admitting: Interventional Radiology

## 2021-07-05 DIAGNOSIS — N281 Cyst of kidney, acquired: Secondary | ICD-10-CM | POA: Diagnosis not present

## 2021-07-05 DIAGNOSIS — I9789 Other postprocedural complications and disorders of the circulatory system, not elsewhere classified: Secondary | ICD-10-CM | POA: Insufficient documentation

## 2021-07-05 DIAGNOSIS — I714 Abdominal aortic aneurysm, without rupture, unspecified: Secondary | ICD-10-CM | POA: Diagnosis not present

## 2021-07-05 DIAGNOSIS — K7689 Other specified diseases of liver: Secondary | ICD-10-CM | POA: Diagnosis not present

## 2021-07-05 MED ORDER — GADOBUTROL 1 MMOL/ML IV SOLN
10.0000 mL | Freq: Once | INTRAVENOUS | Status: AC | PRN
  Administered 2021-07-05: 10 mL via INTRAVENOUS

## 2021-07-08 ENCOUNTER — Encounter: Payer: Self-pay | Admitting: *Deleted

## 2021-07-08 ENCOUNTER — Ambulatory Visit
Admission: RE | Admit: 2021-07-08 | Discharge: 2021-07-08 | Disposition: A | Discharge status: Home / Self Care | Payer: Medicare Other | Source: Ambulatory Visit | Attending: Interventional Radiology | Admitting: Interventional Radiology

## 2021-07-08 DIAGNOSIS — Z6835 Body mass index (BMI) 35.0-35.9, adult: Secondary | ICD-10-CM | POA: Diagnosis not present

## 2021-07-08 DIAGNOSIS — Z955 Presence of coronary angioplasty implant and graft: Secondary | ICD-10-CM | POA: Diagnosis not present

## 2021-07-08 DIAGNOSIS — N400 Enlarged prostate without lower urinary tract symptoms: Secondary | ICD-10-CM | POA: Diagnosis not present

## 2021-07-08 DIAGNOSIS — I700 Atherosclerosis of aorta: Secondary | ICD-10-CM | POA: Diagnosis not present

## 2021-07-08 DIAGNOSIS — C61 Malignant neoplasm of prostate: Secondary | ICD-10-CM | POA: Diagnosis not present

## 2021-07-08 DIAGNOSIS — I9789 Other postprocedural complications and disorders of the circulatory system, not elsewhere classified: Secondary | ICD-10-CM | POA: Diagnosis not present

## 2021-07-08 DIAGNOSIS — J439 Emphysema, unspecified: Secondary | ICD-10-CM | POA: Diagnosis not present

## 2021-07-08 DIAGNOSIS — Z832 Family history of diseases of the blood and blood-forming organs and certain disorders involving the immune mechanism: Secondary | ICD-10-CM | POA: Diagnosis not present

## 2021-07-08 DIAGNOSIS — E7800 Pure hypercholesterolemia, unspecified: Secondary | ICD-10-CM | POA: Diagnosis not present

## 2021-07-08 DIAGNOSIS — J449 Chronic obstructive pulmonary disease, unspecified: Secondary | ICD-10-CM | POA: Diagnosis not present

## 2021-07-08 HISTORY — PX: IR RADIOLOGIST EVAL & MGMT: IMG5224

## 2021-07-08 NOTE — Progress Notes (Signed)
? ? ?Chief Complaint: ?Patient was consulted remotely today (TeleHealth) for possible pancreatic mass at the request of Dashton Czerwinski K.   ? ?Referring Physician(s): ?Athel Merriweather K ? ?History of Present Illness: ?Juan Lynn is a 76 y.o. male with a history of abdominal aortic aneurysm complicated by endoleak for which he has undergone prior endovascular repair.  During surveillance imaging of his abdominal aorta, his most recent CT scan dated 03/15/2021 demonstrated possible fullness in the pancreatic tail concerning for a pancreatic mass. Mr. [name] has a first cousin who passed away from pancreatic cancer and was understandably concerned.   ? ?We spoke over the phone today following completion of his MRI of the abdomen.  Fortunately, there is no abnormality within the pancreas.  I discussed these findings with him via the telephone, he was quite relieved.  He has no new or active complaints at this time. ? ?Past Medical History:  ?Diagnosis Date  ? AAA (abdominal aortic aneurysm)   ? BPH (benign prostatic hyperplasia)   ? CAD (coronary artery disease) 06/25/2012  ? a. Inf STEMI s/p cath- DES-mid RCA b. Echo- EF 55%, basal inferior HK  ? Cancer [facility])   ? Carotid artery disease ([place])   ? Central sleep apnea   ? COPD (chronic obstructive pulmonary disease) ([place])   ? Essential hypertension   ? History of heart attack   ? Hypercholesterolemia   ? Prostate cancer ([place])   ? Tobacco abuse   ? ? ?Past Surgical History:  ?Procedure Laterality Date  ? ABDOMINAL AORTIC ENDOVASCULAR STENT GRAFT N/A 11/11/2017  ? Procedure: ABDOMINAL AORTIC ENDOVASCULAR STENT GRAFT;  Surgeon: Marty Heck, MD;  Location: [place];  Service: Vascular;  Laterality: N/A;  ? CATARACT EXTRACTION  11/2019  ? CORONARY ANGIOPLASTY WITH STENT PLACEMENT  06/25/2012  ? 100% mid RCA s/p DES, 30% prox AVG Cx, mild luminal irregularities elsewhere; EF 55%  ? HERNIA REPAIR    ? IR ANGIOGRAM PELVIS SELECTIVE OR SUPRASELECTIVE  06/30/2019   ? IR ANGIOGRAM SELECTIVE EACH ADDITIONAL VESSEL  06/30/2019  ? IR ANGIOGRAM SELECTIVE EACH ADDITIONAL VESSEL  06/30/2019  ? IR ANGIOGRAM SELECTIVE EACH ADDITIONAL VESSEL  06/30/2019  ? IR ANGIOGRAM VISCERAL SELECTIVE  06/30/2019  ? IR ANGIOGRAM VISCERAL SELECTIVE  06/30/2019  ? IR EMBO ARTERIAL NOT HEMORR HEMANG INC GUIDE ROADMAPPING  06/30/2019  ? IR RADIOLOGIST EVAL & MGMT  06/01/2019  ? IR RADIOLOGIST EVAL & MGMT  07/28/2019  ? IR RADIOLOGIST EVAL & MGMT  03/14/2020  ? IR RADIOLOGIST EVAL & MGMT  04/03/2021  ? IR US GUIDE VASC ACCESS RIGHT  06/30/2019  ? LEFT HEART CATHETERIZATION WITH CORONARY ANGIOGRAM N/A 06/25/2012  ? Procedure: LEFT HEART CATHETERIZATION WITH CORONARY ANGIOGRAM;  Surgeon: Burnell Blanks, MD;  Location: [facility] CATH LAB;  Service: Cardiovascular;  Laterality: N/A;  ? PERCUTANEOUS CORONARY STENT INTERVENTION (PCI-S)  06/25/2012  ? Procedure: PERCUTANEOUS CORONARY STENT INTERVENTION (PCI-S);  Surgeon: Burnell Blanks, MD;  Location: [facility] CATH LAB;  Service: Cardiovascular;;  ? ? ?Allergies: ?Patient has no known allergies. ? ?Medications: ?Prior to Admission medications   ?Medication Sig Start Date End Date Taking? Authorizing Provider  ?atorvastatin (LIPITOR) 80 MG tablet TAKE 1 TABLET AT BEDTIME 11/29/20   Burnell Blanks, MD  ?clopidogrel (PLAVIX) 75 MG tablet TAKE 1 TABLET AT BEDTIME 08/01/20   Belva Crome, MD  ?finasteride (PROSCAR) 5 MG tablet Take 5 mg by mouth at bedtime.     [provider]  ?Fluticasone-Salmeterol (ADVAIR) 100-50 MCG/DOSE  AEPB Inhale 1 puff into the lungs 2 (two) times daily as needed (shortness).    [provider]  ?metoprolol succinate (TOPROL-XL) 50 MG 24 hr tablet TAKE 1 TABLET DAILY WITH OR IMMEDIATELY FOLLOWING A MEAL 08/01/20   Belva Crome, MD  ?Multiple Vitamin (MULTIVITAMIN WITH MINERALS) TABS tablet Take 1 tablet by mouth at bedtime. Centrum silver men    [provider]  ?nitroGLYCERIN (NITROSTAT) 0.4 MG SL  tablet Place 1 tablet (0.4 mg total) under the tongue every 5 (five) minutes as needed for chest pain. 10/05/19   Dunn, Nedra Hai, PA-C  ?tamsulosin (FLOMAX) 0.4 MG CAPS Take 0.8 mg by mouth at bedtime.     [provider]  ?  ? ?Family History  ?Problem Relation Age of Onset  ? Heart attack Father   ? CAD Father   ? Pancreatic cancer Father   ? Aneurysm Sister   ? Aneurysm Brother   ? Emphysema Maternal Grandfather   ?     smoked heavily   ? Stomach cancer Paternal Grandmother   ? Prostate cancer Paternal Uncle   ? Aneurysm Niece   ? ? ?Social History  ? ?Socioeconomic History  ? Marital status: Divorced  ?  Spouse name: Not on file  ? Number of children: 2  ? Years of education: Not on file  ? Highest education level: Not on file  ?Occupational History  ? Occupation: Retired  ?Tobacco Use  ? Smoking status: Former  ?  Packs/day: 1.00  ?  Years: 55.00  ?  Pack years: 55.00  ?  Types: Cigarettes  ?  Quit date: 2019  ?  Years since quitting: 4.3  ?  Passive exposure: Never  ? Smokeless tobacco: Never  ?Vaping Use  ? Vaping Use: Never used  ?Substance and Sexual Activity  ? Alcohol use: Yes  ?  Comment: 3 beers daily  ? Drug use: No  ? Sexual activity: Not on file  ?Other Topics Concern  ? Not on file  ?Social History Narrative  ? Patient lives with his daughter  ? ?Social Determinants of Health  ? ?Financial Resource Strain: Not on file  ?Food Insecurity: Not on file  ?Transportation Needs: Not on file  ?Physical Activity: Not on file  ?Stress: Not on file  ?Social Connections: Not on file  ? ? ?Review of Systems ? ?Review of Systems: A 12 point ROS discussed and pertinent positives are indicated in the HPI above.  All other systems are negative. ? ?Physical Exam ?No direct physical exam was performed (except for noted visual exam findings with Video Visits).  ?  ?Vital Signs: ?There were no vitals taken for this visit. ? ?Imaging: ?MR ABDOMEN WWO CONTRAST ? ?Result Date: 07/08/2021 ?CLINICAL DATA:  Indeterminate  mild fullness of the pancreatic tail on recent CT angiogram of the abdomen and pelvis. EXAM: MRI ABDOMEN WITHOUT AND WITH CONTRAST TECHNIQUE: Multiplanar multisequence MR imaging of the abdomen was performed both before and after the administration of intravenous contrast. CONTRAST:  58m GADAVIST GADOBUTROL 1 MMOL/ML IV SOLN COMPARISON:  03/15/2021 CT angiogram abdomen and pelvis. FINDINGS: Lower chest: No acute abnormality at the lung bases. Hepatobiliary: Normal liver size and configuration. No hepatic steatosis. A few scattered small simple liver cysts, largest 1.0 cm in the anterior segment 2 left liver. No suspicious liver masses. Focal adenomyomatosis in the fundal gallbladder wall. Otherwise normal gallbladder with no cholelithiasis. No biliary ductal dilatation. Common bile duct diameter 2 mm. No choledocholithiasis.  No biliary masses, strictures or beading. Pancreas: No pancreatic mass or duct dilation. No pancreas divisum. Pancreatic tail is normal. Spleen: Normal size. No mass. Adrenals/Urinary Tract: Normal adrenals. No hydronephrosis. Two simple right renal cortical cysts, largest 2.1 cm in the lateral interpolar right kidney, for which no follow-up is recommended. No suspicious renal masses. Chronic lower left renal scarring. Stomach/Bowel: Normal non-distended stomach. Visualized small and large bowel is normal caliber, with no bowel wall thickening. Vascular/Lymphatic: Atherosclerotic abdominal aorta with 6.3 cm infrarenal abdominal aortic aneurysm status post aortobi-iliac stent graft repair. Patent portal, splenic, hepatic and renal veins. No pathologically enlarged lymph nodes in the abdomen. Other: No abdominal ascites or focal fluid collection. Musculoskeletal: No aggressive appearing focal osseous lesions. IMPRESSION: 1. No acute abnormality. Normal pancreas. 2. Small simple liver and right renal cysts, for which no follow-up is recommended. 3. Infrarenal 6.3 cm abdominal aortic aneurysm  status post aortobi-iliac stent graft repair. 4.  Aortic Atherosclerosis (ICD10-I70.0). Electronically Signed   By: Ilona Sorrel M.D.   On: 07/08/2021 09:05  ? ?MR 3D Recon At Scanner ? ?Result Date: 07/08/2021

## 2021-08-07 ENCOUNTER — Other Ambulatory Visit: Payer: Self-pay | Admitting: Interventional Cardiology

## 2021-09-13 ENCOUNTER — Other Ambulatory Visit: Payer: Self-pay | Admitting: Cardiovascular Disease

## 2021-10-02 DIAGNOSIS — L578 Other skin changes due to chronic exposure to nonionizing radiation: Secondary | ICD-10-CM | POA: Diagnosis not present

## 2021-10-02 DIAGNOSIS — X32XXXD Exposure to sunlight, subsequent encounter: Secondary | ICD-10-CM | POA: Diagnosis not present

## 2021-10-02 DIAGNOSIS — L570 Actinic keratosis: Secondary | ICD-10-CM | POA: Diagnosis not present

## 2021-10-02 NOTE — Progress Notes (Signed)
Cardiology Office Note    Date:  10/04/2021   ID:  [name], DOB November 10, 1945, MRN [id number]  PCP:  Kathyrn Lass, MD  Cardiologist:  Lauree Chandler, MD  Electrophysiologist:  None   Chief Complaint: f/u CAD  History of Present Illness:   Juan Lynn is a 76 y.o. male with history of CAD (inferior STEMI 06/2012 s/p DES to RCA, mild residual Cx disease), COPD, central sleep apnea (compliant with BiPAP, followed @ [facility]), mild carotid artery disease, former tobacco abuse, habitual ETOH use (2-3 drinks nightly), prostate cancer and AAA s/p aortoiiliac endovascular repair September 2019 (followed by [facility]) who presents for follow-up. Last echo 11/2017 showed EF 55-60%, grade 1 DD, mild LAE. He is also followed by [facility] for history of AAA repair with known endoleak post-op, s/p subsequent f/u IR repair in 06/2019. In summer 2021 he was having issues with headaches treated by primary care. CT head nonacute, + microvascular changes. Carotid US showed <50% RICA, approximately 26% LICA. Fortunately his headaches improved without specific intervention. Repeat carotid duplex 08/2020 near normal without significant abnormalities. As previously noted, he is comfortable with medical terminology although does not have a medical background.  He is seen for follow-up largely doing well. He does report episodic chest discomfort over the last several months that can be as infrequent as every few weeks. It was more frequent a few weeks ago but has calmed down and last episode 2 weeks ago. He did previously have chest discomfort noted in prior visits but was described as fleeting in the past. This discomfort feels like a pressure lasting 15-30 minutes, no associated SOB, n/v or diaphoresis. Happens primarily at rest. Does not happen with exertion, the max of which he does is ADLs/chores. He has not had any prolonged episodes and has not tried anything for the discomfort. It resolves spontaneously. He drinks 2-3  drinks per day. We did touch upon cutting down as in the past. He read something online about metoprolol interacting with orange juice or milk so I reviewed with pharmacist M. Maccia who indicated this should not be an issue. He is aware to avoid grapefruit juice.   Labwork independently reviewed: 07/2021 K 4.1, Cr 1.10, TBili 1.5 otherwise labs OK, LDL 66, trig 133 10/2020 TSH wnl, Hgb 16.6, plt 136   Cardiology Studies:   Studies reviewed are outlined and summarized above. Reports included below if pertinent.   Cath 06/2012 Procedure Performed: Left Heart Catheterization Selective Coronary Angiography Left ventricular angiogram Aspiration Thrombectomy RCA PTCA/DES x 1 mid RCA Angioseal RFA   Operator: Lauree Chandler, MD   Indication: 76 yo male with history of COPD, tobacco abuse, OSA who is transferred via EMS with acute inferior STEMI. Pain began at 9am this morning. Code STEMI called 12:52pm. Pt with ongoing chest pain upon arrival at [facility].                                        Procedure Details: The risks, benefits, complications, treatment options, and expected outcomes were discussed with the patient. The patient and/or family concurred with the proposed plan, giving informed verbal consent. Emergency consent documented. The patient was brought to the cath lab via EMS. The patient was not sedated due to hypotension. The right groin was prepped and draped in the usual manner. Using the modified Seldinger access technique, a 6 French sheath was placed in the right  femoral artery. A JL4 catheter was used to engage the left main artery. Selective angiopraphy of the left coronary system was performed. I then engaged the RCA with a 6 [place] JR4 guiding catheter. The RCA was found to be totally occluded in the mid vessel. He was given a bolus of Angiomax and a drip was started. He was given 60 mg Effient po x 1. When the ACT was greater than 200, I passed a BMW wire down the RCA and  beyond the total occlusion. Flow was re-established with the wire. I then used a 2.5 x 15 mm balloon to pre-dilate the stenosis. I then passed a Terumo Priority One aspiration catheter into the mid RCA and extracted a large red thrombus. The haziness in the mid RCA improved after thrombectomy. I then carefully positioned a 3.0 x 24 mm Promus Premier in the mid RCA and deployed it at 14 atm. The stent was post-dilated with a 3.5 x 20 mm [place] balloon x 1. There was an excellent result. The stenosis was taken from 100% down to 0%.  A pigtail catheter was used to perform a left ventricular angiogram. An Angioseal femoral artery closure device was placed in the right femoral artery.   The case was complicated by relative hypotension and bradycardia. He was given atropine x 1.   There were no immediate procedural complications. The patient was taken to the CCU in stable condition.   Hemodynamic Findings: Central aortic pressure: 108/66 Left ventricular pressure: 107/9/16   Angiographic Findings:   Left main:  No obstructive disease.   Left Anterior Descending Artery: Large caliber vessel that courses to the apex. Mild luminal irregularities in the mid vessel.   Circumflex Artery: Large caliber vessel with termination into a obtuse marginal branch. 30% proximal AV groove Circumflex stenosis. Small intermediate branch   Right Coronary Artery: Large dominant vessel with 100% mid occlusion.   Left Ventricular Angiogram: LVEF=55%   Impression: 1. Inferior STEMI secondary to occluded mid RCA 2. Successful PTCA/DES x 1 mid RCA 3. Mild disease LAD and Circumflex 4. Preserved LV systolic function   Recommendations: Will admit to CCU. He will be continued on on ASA and Effient for one year. Will start beta blocker and statin. If BP tolerates, start Ace-inh in am. Echo in am. No plans for d/c home before Sunday.       Complications:  None. The patient tolerated the procedure well.        2D Echo  11/2017  - Left ventricle: The cavity size was normal. Systolic function was    normal. The estimated ejection fraction was in the range of 55%    to 60%. Wall motion was normal; there were no regional wall    motion abnormalities. Doppler parameters are consistent with    abnormal left ventricular relaxation (grade 1 diastolic    dysfunction).  - Left atrium: The atrium was mildly dilated.       Carotid artery [place] 08/2020 Summary:  Right Carotid: The extracranial vessels were near-normal with only minimal  wall                 thickening or plaque.   Left Carotid: The extracranial vessels were near-normal with only minimal  wall                thickening or plaque.   Vertebrals:  Bilateral vertebral arteries demonstrate antegrade flow.  Subclavians: Normal flow hemodynamics were seen in bilateral subclavian  arteries.   *See table(s) above for measurements and observations.     Past Medical History:  Diagnosis Date   AAA (abdominal aortic aneurysm) ([facility])    BPH (benign prostatic hyperplasia)    CAD (coronary artery disease) 06/25/2012   a. Inf STEMI s/p cath- DES-mid RCA b. Echo- EF 55%, basal inferior HK   Cancer ([facility])    Carotid artery disease ([place])    Central sleep apnea    COPD (chronic obstructive pulmonary disease) ([facility])    Essential hypertension    History of heart attack    Hypercholesterolemia    Prostate cancer ([facility])    Tobacco abuse     Past Surgical History:  Procedure Laterality Date   ABDOMINAL AORTIC ENDOVASCULAR STENT GRAFT N/A 11/11/2017   Procedure: ABDOMINAL AORTIC ENDOVASCULAR STENT GRAFT;  Surgeon: Marty Heck, MD;  Location: [place];  Service: Vascular;  Laterality: N/A;   CATARACT EXTRACTION  11/2019   CORONARY ANGIOPLASTY WITH STENT PLACEMENT  06/25/2012   100% mid RCA s/p DES, 30% prox AVG Cx, mild luminal irregularities elsewhere; EF 55%   HERNIA REPAIR     IR ANGIOGRAM PELVIS SELECTIVE OR SUPRASELECTIVE  06/30/2019   IR  ANGIOGRAM SELECTIVE EACH ADDITIONAL VESSEL  06/30/2019   IR ANGIOGRAM SELECTIVE EACH ADDITIONAL VESSEL  06/30/2019   IR ANGIOGRAM SELECTIVE EACH ADDITIONAL VESSEL  06/30/2019   IR ANGIOGRAM VISCERAL SELECTIVE  06/30/2019   IR ANGIOGRAM VISCERAL SELECTIVE  06/30/2019   IR EMBO ARTERIAL NOT HEMORR HEMANG INC GUIDE ROADMAPPING  06/30/2019   IR RADIOLOGIST EVAL & MGMT  06/01/2019   IR RADIOLOGIST EVAL & MGMT  07/28/2019   IR RADIOLOGIST EVAL & MGMT  03/14/2020   IR RADIOLOGIST EVAL & MGMT  04/03/2021   IR RADIOLOGIST EVAL & MGMT  07/08/2021   IR US GUIDE VASC ACCESS RIGHT  06/30/2019   LEFT HEART CATHETERIZATION WITH CORONARY ANGIOGRAM N/A 06/25/2012   Procedure: LEFT HEART CATHETERIZATION WITH CORONARY ANGIOGRAM;  Surgeon: Burnell Blanks, MD;  Location: [facility] CATH LAB;  Service: Cardiovascular;  Laterality: N/A;   PERCUTANEOUS CORONARY STENT INTERVENTION (PCI-S)  06/25/2012   Procedure: PERCUTANEOUS CORONARY STENT INTERVENTION (PCI-S);  Surgeon: Burnell Blanks, MD;  Location: [facility] CATH LAB;  Service: Cardiovascular;;    Current Medications: Current Meds  Medication Sig   atorvastatin (LIPITOR) 80 MG tablet TAKE 1 TABLET AT BEDTIME   clopidogrel (PLAVIX) 75 MG tablet TAKE 1 TABLET AT BEDTIME (PLEASE CALL OUR OFFICE TO SCHEDULE AN OVERDUE APPOINTMENT WITH DOCTOR MCALHANY BEFORE ANYMORE)   finasteride (PROSCAR) 5 MG tablet Take 5 mg by mouth at bedtime.    fluticasone-salmeterol (ADVAIR HFA) 115-21 MCG/ACT inhaler 2 puffs   Fluticasone-Salmeterol (ADVAIR) 100-50 MCG/DOSE AEPB Inhale 1 puff into the lungs 2 (two) times daily as needed (shortness).   metoprolol succinate (TOPROL-XL) 50 MG 24 hr tablet TAKE 1 TABLET DAILY WITH OR IMMEDIATELY FOLLOWING A MEAL   Multiple Vitamin (MULTIVITAMIN WITH MINERALS) TABS tablet Take 1 tablet by mouth at bedtime. Centrum silver men   nitroGLYCERIN (NITROSTAT) 0.4 MG SL tablet Place 1 tablet (0.4 mg total) under the tongue every 5 (five) minutes as  needed for chest pain.   tamsulosin (FLOMAX) 0.4 MG CAPS Take 0.8 mg by mouth at bedtime.       Allergies:   Patient has no known allergies.   Social History   Socioeconomic History   Marital status: Divorced    Spouse name: Not on file   Number of children: 2  Years of education: Not on file   Highest education level: Not on file  Occupational History   Occupation: Retired  Tobacco Use   Smoking status: Former    Packs/day: 1.00    Years: 55.00    Total pack years: 55.00    Types: Cigarettes    Quit date: 2019    Years since quitting: 4.5    Passive exposure: Never   Smokeless tobacco: Never  Vaping Use   Vaping Use: Never used  Substance and Sexual Activity   Alcohol use: Yes    Comment: 3 beers daily   Drug use: No   Sexual activity: Not on file  Other Topics Concern   Not on file  Social History Narrative   Patient lives with his daughter   Social Determinants of Health   Financial Resource Strain: Not on file  Food Insecurity: Not on file  Transportation Needs: Not on file  Physical Activity: Not on file  Stress: Not on file  Social Connections: Not on file     Family History:  The patient's family history includes Aneurysm in his brother, niece, and sister; CAD in his father; Emphysema in his maternal grandfather; Heart attack in his father; Pancreatic cancer in his father; Prostate cancer in his paternal uncle; Stomach cancer in his paternal grandmother.  ROS:   Please see the history of present illness.  All other systems are reviewed and otherwise negative.    EKG(s)/Additional Labs   EKG:  EKG is ordered today, personally reviewed, demonstrating SB 54bpm, nonspecific TW changes  Recent Labs: 10/08/2020: ALT 19; BUN 19; Creatinine, Ser 1.29; Hemoglobin 16.6; Platelets 136; Potassium 4.3; Sodium 138; TSH 1.780  Recent Lipid Panel    Component Value Date/Time   CHOL 137 10/08/2020 1040   TRIG 105 10/08/2020 1040   HDL 56 10/08/2020 1040    CHOLHDL 2.4 10/08/2020 1040   CHOLHDL 2 08/11/2012 0924   VLDL 16.4 08/11/2012 0924   LDLCALC 62 10/08/2020 1040    PHYSICAL EXAM:    VS:  BP 123/72   Pulse (!) 54   Ht 5' 7.5" (1.715 m)   Wt 198 lb 3.2 oz (89.9 kg)   SpO2 90%   BMI 30.58 kg/m   BMI: Body mass index is 30.58 kg/m.  GEN: Well nourished, well developed male in no acute distress HEENT: normocephalic, atraumatic Neck: no JVD, carotid bruits, or masses Cardiac: RRR; no murmurs, rubs, or gallops, no edema  Respiratory:  clear to auscultation bilaterally, normal work of breathing GI: soft, nontender, nondistended, + BS MS: no deformity or atrophy Skin: warm and dry, no rash Neuro:  Alert and Oriented x 3, Strength and sensation are intact, follows commands Psych: euthymic mood, full affect  Wt Readings from Last 3 Encounters:  10/04/21 198 lb 3.2 oz (89.9 kg)  04/16/21 193 lb 8 oz (87.8 kg)  04/15/21 194 lb (88 kg)     ASSESSMENT & PLAN:   1. CAD, atypical chest pain - largely doing well. Still with episodes of atypical CP though description is now somewhat different than prior years. Therefore, will pursue Lexiscan nuclear stress test. Lexiscan chosen over treadmill due to h/o AAA repair. Patient aware of recommendation to cut down ETOH. Will continue Plavix (got OK to stop ASA in 2022), atorvastatin, metoprolol. CMA will assist with sending in refills.  Shared Decision Making/Informed Consent The risks [chest pain, shortness of breath, cardiac arrhythmias, dizziness, blood pressure fluctuations, myocardial infarction, stroke/transient ischemic attack, nausea, vomiting, allergic  reaction, radiation exposure, metallic taste sensation and life-threatening complications (estimated to be 1 in 10,000)], benefits (risk stratification, diagnosing coronary artery disease, treatment guidance) and alternatives of a nuclear stress test were discussed in detail with Mr. Betts and he agrees to proceed.   2. AAA - followed by  [facility], next visit planned around 04/2022.  3. Essential HTN - blood pressure well controlled. No changes today.  4. Mild carotid artery disease - repeat duplex showed near normal carotids in 2022. Can follow PRN going forward. Continue statin therapy. Last labs reviewed from PCP. They obtained his liver function as well, will defer to their team if anything else needed for frequently elevated Tbili. AST/ALT were normal.     Disposition: F/u with Dr. Angelena Form or myself in 1 year, sooner if stress test abnormal.   Medication Adjustments/Labs and Tests Ordered: Current medicines are reviewed at length with the patient today.  Concerns regarding medicines are outlined above. Medication changes, Labs and Tests ordered today are summarized above and listed in the Patient Instructions accessible in Encounters.   Signed, Charlie Pitter, PA-C  10/04/2021 4:13 PM    [place] Phone: [phone]; Fax: [phone]

## 2021-10-04 ENCOUNTER — Ambulatory Visit (INDEPENDENT_AMBULATORY_CARE_PROVIDER_SITE_OTHER): Payer: Medicare Other | Admitting: Physician Assistant

## 2021-10-04 ENCOUNTER — Encounter: Payer: Self-pay | Admitting: Physician Assistant

## 2021-10-04 VITALS — BP 123/72 | HR 54 | Ht 67.5 in | Wt 198.2 lb

## 2021-10-04 DIAGNOSIS — I714 Abdominal aortic aneurysm, without rupture, unspecified: Secondary | ICD-10-CM | POA: Diagnosis not present

## 2021-10-04 DIAGNOSIS — I2510 Atherosclerotic heart disease of native coronary artery without angina pectoris: Secondary | ICD-10-CM | POA: Diagnosis not present

## 2021-10-04 DIAGNOSIS — I779 Disorder of arteries and arterioles, unspecified: Secondary | ICD-10-CM | POA: Diagnosis not present

## 2021-10-04 DIAGNOSIS — R0789 Other chest pain: Secondary | ICD-10-CM

## 2021-10-04 DIAGNOSIS — I10 Essential (primary) hypertension: Secondary | ICD-10-CM | POA: Diagnosis not present

## 2021-10-04 MED ORDER — ATORVASTATIN CALCIUM 80 MG PO TABS: 80.0000 mg | ORAL_TABLET | Freq: Every day | ORAL | 3 refills | Status: DC

## 2021-10-04 MED ORDER — NITROGLYCERIN 0.4 MG SL SUBL: 0.4000 mg | SUBLINGUAL_TABLET | SUBLINGUAL | 3 refills | Status: DC | PRN

## 2021-10-04 MED ORDER — METOPROLOL SUCCINATE ER 50 MG PO TB24: ORAL_TABLET | ORAL | 3 refills | Status: DC

## 2021-10-04 MED ORDER — CLOPIDOGREL BISULFATE 75 MG PO TABS: ORAL_TABLET | ORAL | 3 refills | Status: DC

## 2021-10-04 NOTE — Patient Instructions (Addendum)
Medication Instructions:  Your physician recommends that you continue on your current medications as directed. Please refer to the Current Medication list given to you today.   *If you need a refill on your cardiac medications before your next appointment, please call your pharmacy*   Lab Work: TODAY-BMET, CBC, TSH If you have labs (blood work) drawn today and your tests are completely normal, you will receive your results only by: [place] (if you have MyChart) OR A paper copy in the mail If you have any lab test that is abnormal or we need to change your treatment, we will call you to review the results.   Testing/Procedures: Your physician has requested that you have a lexiscan myoview. For further information please visit [email or web address]. Please follow instruction sheet, as given.   Follow-Up: At [facility], you and your health needs are our priority.  As part of our continuing mission to provide you with exceptional heart care, we have created designated Provider Care Teams.  These Care Teams include your primary Cardiologist (physician) and Advanced Practice Providers (APPs -  Physician Assistants and Nurse Practitioners) who all work together to provide you with the care you need, when you need it.  We recommend signing up for the patient portal called "MyChart".  Sign up information is provided on this After Visit Summary.  MyChart is used to connect with patients for Virtual Visits (Telemedicine).  Patients are able to view lab/test results, encounter notes, upcoming appointments, etc.  Non-urgent messages can be sent to your provider as well.   To learn more about what you can do with MyChart, go to [email or web address].    Your next appointment:   12 month(s)  The format for your next appointment:   In Person  Provider:   Lauree Chandler, MD     Other Instructions   Important Information About Sugar

## 2021-10-05 LAB — CBC
Hematocrit: 46.3 % (ref 37.5–51.0)
Hemoglobin: 16.2 g/dL (ref 13.0–17.7)
MCH: 34.1 pg — ABNORMAL HIGH (ref 26.6–33.0)
MCHC: 35 g/dL (ref 31.5–35.7)
MCV: 98 fL — ABNORMAL HIGH (ref 79–97)
Platelets: 130 10*3/uL — ABNORMAL LOW (ref 150–450)
RBC: 4.75 x10E6/uL (ref 4.14–5.80)
RDW: 11.9 % (ref 11.6–15.4)
WBC: 6.8 10*3/uL (ref 3.4–10.8)

## 2021-10-05 LAB — BASIC METABOLIC PANEL
BUN/Creatinine Ratio: 12 (ref 10–24)
BUN: 14 mg/dL (ref 8–27)
CO2: 24 mmol/L (ref 20–29)
Calcium: 8.9 mg/dL (ref 8.6–10.2)
Chloride: 102 mmol/L (ref 96–106)
Creatinine, Ser: 1.16 mg/dL (ref 0.76–1.27)
Glucose: 85 mg/dL (ref 70–99)
Potassium: 3.8 mmol/L (ref 3.5–5.2)
Sodium: 140 mmol/L (ref 134–144)
eGFR: 66 mL/min/{1.73_m2} (ref >59)

## 2021-10-05 LAB — TSH: TSH: 1.72 u[IU]/mL (ref 0.450–4.500)

## 2021-10-08 DIAGNOSIS — Z8546 Personal history of malignant neoplasm of prostate: Secondary | ICD-10-CM | POA: Diagnosis not present

## 2021-10-24 ENCOUNTER — Telehealth (HOSPITAL_COMMUNITY): Payer: Self-pay

## 2021-10-24 NOTE — Telephone Encounter (Signed)
Patient given detailed instructions per Myocardial Perfusion Study Information Sheet for the test on 10/29/21 at 0730. Patient notified to arrive 15 minutes early and that it is imperative to arrive on time for appointment to keep from having the test rescheduled.  If you need to cancel or reschedule your appointment, please call the office within 24 hours of your appointment. . Patient verbalized understanding. TMY

## 2021-10-28 ENCOUNTER — Telehealth: Payer: Self-pay | Admitting: Cardiovascular Disease

## 2021-10-28 NOTE — Telephone Encounter (Signed)
   Pre-operative Risk Assessment    Patient Name: Clayvon Parlett  DOB: 04-Dec-1945 MRN: [id number]     Request for Surgical Clearance    Procedure:  Dental Extraction - Amount of Teeth to be Pulled:  1  Date of Surgery:  Clearance 11/06/21                                  Surgeon:  Dr. Ronnald Ramp Surgeon's Group or Practice Name:  Dr. Arita Miss DDS Office  Phone number:  [phone] Fax number:  [phone]    Type of Clearance Requested:   - Pharmacy:  Hold Clopidogrel (Plavix) not sure    Type of Anesthesia:  Local    Additional requests/questions:    Dorthey Sawyer   10/28/2021, 10:15 AM

## 2021-10-28 NOTE — Telephone Encounter (Signed)
Will route to preop pool to see if they can enter a formal clearance that I would be able to act on once nuc results so we have a better idea of what's being done.

## 2021-10-29 ENCOUNTER — Ambulatory Visit (HOSPITAL_COMMUNITY): Payer: Medicare Other | Attending: Internal Medicine

## 2021-10-29 DIAGNOSIS — I2510 Atherosclerotic heart disease of native coronary artery without angina pectoris: Secondary | ICD-10-CM | POA: Diagnosis not present

## 2021-10-29 DIAGNOSIS — I10 Essential (primary) hypertension: Secondary | ICD-10-CM

## 2021-10-29 DIAGNOSIS — I714 Abdominal aortic aneurysm, without rupture, unspecified: Secondary | ICD-10-CM | POA: Diagnosis not present

## 2021-10-29 DIAGNOSIS — I779 Disorder of arteries and arterioles, unspecified: Secondary | ICD-10-CM | POA: Diagnosis not present

## 2021-10-29 DIAGNOSIS — R0789 Other chest pain: Secondary | ICD-10-CM | POA: Diagnosis not present

## 2021-10-29 LAB — MYOCARDIAL PERFUSION IMAGING
LV dias vol: 80 mL (ref 62–150)
LV sys vol: 36 mL
Nuc Stress EF: 55 %
Peak HR: 85 {beats}/min
Rest HR: 66 {beats}/min
Rest Nuclear Isotope Dose: 10.9 mCi
SDS: 0
SRS: 0
SSS: 0
ST Depression (mm): 0 mm
Stress Nuclear Isotope Dose: 33 mCi
TID: 1.19

## 2021-10-29 MED ORDER — REGADENOSON 0.4 MG/5ML IV SOLN
0.4000 mg | Freq: Once | INTRAVENOUS | Status: AC
  Administered 2021-10-29: 0.4 mg via INTRAVENOUS

## 2021-10-29 MED ORDER — TECHNETIUM TC 99M TETROFOSMIN IV KIT
33.0000 | PACK | Freq: Once | INTRAVENOUS | Status: AC | PRN
  Administered 2021-10-29: 33 via INTRAVENOUS

## 2021-10-29 MED ORDER — TECHNETIUM TC 99M TETROFOSMIN IV KIT
10.9000 | PACK | Freq: Once | INTRAVENOUS | Status: AC | PRN
  Administered 2021-10-29: 10.9 via INTRAVENOUS

## 2021-10-29 NOTE — Telephone Encounter (Signed)
   Patient Name: Juan Lynn  DOB: January 14, 1946 MRN: [id number]  Primary Cardiologist: Lauree Chandler, MD  Chart reviewed as part of pre-operative protocol coverage. Recent stress test was low risk. Simple dental extractions (i.e. 1-2 teeth) are considered low risk procedures per guidelines and generally do not require any specific cardiac clearance. It is also generally accepted that for simple extractions and dental cleanings, there is no need to interrupt blood thinner therapy. We do not recommend stopping Plavix for extraction of 1 tooth.  SBE prophylaxis is not required for the patient from a cardiac standpoint (reviewed with Dr. Angelena Form given h/o AAA repair - not an indication for such).  I will route this recommendation to the requesting party via Epic fax function and remove from pre-op pool.  Please call with questions.  Charlie Pitter, PA-C 10/29/2021, 4:11 PM

## 2021-11-05 ENCOUNTER — Other Ambulatory Visit: Payer: Self-pay

## 2021-11-05 MED ORDER — PANTOPRAZOLE SODIUM 40 MG PO TBEC: 40.0000 mg | DELAYED_RELEASE_TABLET | Freq: Every day | ORAL | 1 refills | Status: DC

## 2022-02-12 DIAGNOSIS — H35371 Puckering of macula, right eye: Secondary | ICD-10-CM | POA: Diagnosis not present

## 2022-02-12 DIAGNOSIS — Z961 Presence of intraocular lens: Secondary | ICD-10-CM | POA: Diagnosis not present

## 2022-02-12 DIAGNOSIS — H52203 Unspecified astigmatism, bilateral: Secondary | ICD-10-CM | POA: Diagnosis not present

## 2022-02-19 ENCOUNTER — Ambulatory Visit (HOSPITAL_BASED_OUTPATIENT_CLINIC_OR_DEPARTMENT_OTHER)
Admission: RE | Admit: 2022-02-19 | Discharge: 2022-02-19 | Disposition: A | Discharge status: Home / Self Care | Payer: Medicare Other | Source: Ambulatory Visit | Attending: Acute Care | Admitting: Acute Care

## 2022-02-19 DIAGNOSIS — Z87891 Personal history of nicotine dependence: Secondary | ICD-10-CM | POA: Insufficient documentation

## 2022-02-25 ENCOUNTER — Other Ambulatory Visit: Payer: Self-pay | Admitting: Acute Care

## 2022-02-25 DIAGNOSIS — Z87891 Personal history of nicotine dependence: Secondary | ICD-10-CM

## 2022-02-25 DIAGNOSIS — Z122 Encounter for screening for malignant neoplasm of respiratory organs: Secondary | ICD-10-CM

## 2022-02-26 ENCOUNTER — Other Ambulatory Visit: Payer: Self-pay | Admitting: Interventional Radiology

## 2022-02-26 DIAGNOSIS — I9789 Other postprocedural complications and disorders of the circulatory system, not elsewhere classified: Secondary | ICD-10-CM

## 2022-03-13 ENCOUNTER — Ambulatory Visit (HOSPITAL_COMMUNITY)
Admission: RE | Admit: 2022-03-13 | Discharge: 2022-03-13 | Disposition: A | Discharge status: Home / Self Care | Payer: Medicare Other | Source: Ambulatory Visit | Attending: Interventional Radiology | Admitting: Interventional Radiology

## 2022-03-13 ENCOUNTER — Encounter (HOSPITAL_COMMUNITY): Payer: Self-pay

## 2022-03-13 ENCOUNTER — Other Ambulatory Visit: Payer: Self-pay | Admitting: Urology

## 2022-03-13 DIAGNOSIS — J9811 Atelectasis: Secondary | ICD-10-CM | POA: Diagnosis not present

## 2022-03-13 DIAGNOSIS — I9789 Other postprocedural complications and disorders of the circulatory system, not elsewhere classified: Secondary | ICD-10-CM

## 2022-03-13 DIAGNOSIS — Z8546 Personal history of malignant neoplasm of prostate: Secondary | ICD-10-CM

## 2022-03-13 DIAGNOSIS — N281 Cyst of kidney, acquired: Secondary | ICD-10-CM | POA: Diagnosis not present

## 2022-03-13 MED ORDER — IOHEXOL 350 MG/ML SOLN
100.0000 mL | Freq: Once | INTRAVENOUS | Status: AC | PRN
  Administered 2022-03-13: 100 mL via INTRAVENOUS

## 2022-03-25 ENCOUNTER — Ambulatory Visit
Admission: RE | Admit: 2022-03-25 | Discharge: 2022-03-25 | Disposition: A | Discharge status: Home / Self Care | Payer: Medicare Other | Source: Ambulatory Visit | Attending: Interventional Radiology | Admitting: Interventional Radiology

## 2022-03-25 DIAGNOSIS — Z9889 Other specified postprocedural states: Secondary | ICD-10-CM | POA: Diagnosis not present

## 2022-03-25 DIAGNOSIS — I714 Abdominal aortic aneurysm, without rupture, unspecified: Secondary | ICD-10-CM | POA: Diagnosis not present

## 2022-03-25 DIAGNOSIS — I9789 Other postprocedural complications and disorders of the circulatory system, not elsewhere classified: Secondary | ICD-10-CM

## 2022-03-25 NOTE — Progress Notes (Signed)
Chief Complaint: Patient was consulted remotely today (TeleHealth) for type 2 endoleak at the request of [name].    Referring Physician(s): Dr. Monica Martinez   History of Present Illness: Juan Lynn is a 77 y.o. 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   [name] underwent endovascular repair on 07/01/2019.  He was found to have inflow via the right L4 lumbar artery and outflow via an accessory artery to the lower pole of the left kidney.  His procedure was a success with coil embolization of both the outflow and inflow arteries as well as liquid embolic embolization of the Endosac itself.   CTA 03/09/20 No persisting endoleak, status post embolization. The estimated diameter of the excluded aneurysm sac is unchanged from the pre embolization CT study.  CTA 03/15/21 Aortic aneurysm sac has minimally enlarged since 03/09/2020. Aortic aneurysm sac measures 5.9 x 6.0 cm. The configuration of the aneurysm sac has not changed. There is concern for a type 2 endoleak along the superior aspect of the aneurysm sac seen only on the delayed images. This area is difficult to evaluate due to streak artifact from the previous embolization procedures.  CTA 03/13/22 Abdominal aortic aneurysm status post endovascular aortic repair and endovascular repair of type 2 endoleak. The excluded aneurysm sac remains stable to slightly decreased at 6.0 x 5.5 cm.  Questionable persistent delayed endoleak in the superior aspect of the sac. No evidence of endoleak on arterial phase imaging.  Today we meet over the phone.     He is doing well.  He denies any abdominal or flank pain.     Past Medical History:  Diagnosis Date   AAA (abdominal aortic aneurysm) (HCC)    BPH (benign prostatic hyperplasia)    CAD (coronary artery disease) 06/25/2012    a. Inf STEMI s/p cath- DES-mid RCA b. Echo- EF 55%, basal inferior HK   Cancer (HCC)    Carotid artery disease ([place])    Central sleep apnea    COPD (chronic obstructive pulmonary disease) ([place])    Essential hypertension    History of heart attack    Hypercholesterolemia    Prostate cancer ([place])    Tobacco abuse     Past Surgical History:  Procedure Laterality Date   ABDOMINAL AORTIC ENDOVASCULAR STENT GRAFT N/A 11/11/2017   Procedure: ABDOMINAL AORTIC ENDOVASCULAR STENT GRAFT;  Surgeon: Marty Heck, MD;  Location: [facility];  Service: Vascular;  Laterality: N/A;   CATARACT EXTRACTION  11/2019   CORONARY ANGIOPLASTY WITH STENT PLACEMENT  06/25/2012   100% mid RCA s/p DES, 30% prox AVG Cx, mild luminal irregularities elsewhere; EF 55%   HERNIA REPAIR     IR ANGIOGRAM PELVIS SELECTIVE OR SUPRASELECTIVE  06/30/2019   IR ANGIOGRAM SELECTIVE EACH ADDITIONAL VESSEL  06/30/2019   IR ANGIOGRAM SELECTIVE EACH ADDITIONAL VESSEL  06/30/2019   IR ANGIOGRAM SELECTIVE EACH ADDITIONAL VESSEL  06/30/2019   IR ANGIOGRAM VISCERAL SELECTIVE  06/30/2019   IR ANGIOGRAM VISCERAL SELECTIVE  06/30/2019   IR EMBO ARTERIAL NOT HEMORR HEMANG INC GUIDE ROADMAPPING  06/30/2019   IR RADIOLOGIST EVAL & MGMT  06/01/2019   IR RADIOLOGIST EVAL & MGMT  07/28/2019   IR RADIOLOGIST EVAL & MGMT  03/14/2020   IR RADIOLOGIST EVAL & MGMT  04/03/2021   IR RADIOLOGIST EVAL & MGMT  07/08/2021   IR US GUIDE  VASC ACCESS RIGHT  06/30/2019   LEFT HEART CATHETERIZATION WITH CORONARY ANGIOGRAM N/A 06/25/2012   Procedure: LEFT HEART CATHETERIZATION WITH CORONARY ANGIOGRAM;  Surgeon: Burnell Blanks, MD;  Location[facility] CATH LAB;  Service: Cardiovascular;  Laterality: N/A;   PERCUTANEOUS CORONARY STENT INTERVENTION (PCI-S)  06/25/2012   Procedure: PERCUTANEOUS CORONARY STENT INTERVENTION (PCI-S);  Surgeon: Burnell Blanks, MD;  Location: [facility] CATH LAB;  Service: Cardiovascular;;    Allergies: Patient has no known  allergies.  Medications: Prior to Admission medications   Medication Sig Start Date End Date Taking? Authorizing Provider  atorvastatin (LIPITOR) 80 MG tablet Take 1 tablet (80 mg total) by mouth at bedtime. 10/04/21   Dunn, Nedra Hai, PA-C  clopidogrel (PLAVIX) 75 MG tablet TAKE 1 TABLET AT BEDTIME 10/04/21   Dunn, Dayna N, PA-C  finasteride (PROSCAR) 5 MG tablet Take 5 mg by mouth at bedtime.     [provider]  fluticasone-salmeterol (ADVAIR HFA) 497-02 MCG/ACT inhaler 2 puffs 02/13/21   [provider]  Fluticasone-Salmeterol (ADVAIR) 100-50 MCG/DOSE AEPB Inhale 1 puff into the lungs 2 (two) times daily as needed (shortness).    [provider]  metoprolol succinate (TOPROL-XL) 50 MG 24 hr tablet TAKE 1 TABLET DAILY WITH OR IMMEDIATELY FOLLOWING A MEAL 10/04/21   Dunn, Nedra Hai, PA-C  Multiple Vitamin (MULTIVITAMIN WITH MINERALS) TABS tablet Take 1 tablet by mouth at bedtime. Centrum silver men    [provider]  nitroGLYCERIN (NITROSTAT) 0.4 MG SL tablet Place 1 tablet (0.4 mg total) under the tongue every 5 (five) minutes as needed for chest pain. 10/04/21   Dunn, Nedra Hai, PA-C  pantoprazole (PROTONIX) 40 MG tablet Take 1 tablet (40 mg total) by mouth daily. 11/05/21   Dunn, Nedra Hai, PA-C  tamsulosin (FLOMAX) 0.4 MG CAPS Take 0.8 mg by mouth at bedtime.     [provider]     Family History  Problem Relation Age of Onset   Heart attack Father    CAD Father    Pancreatic cancer Father    Aneurysm Sister    Aneurysm Brother    Emphysema Maternal Grandfather        smoked heavily    Stomach cancer Paternal Grandmother    Prostate cancer Paternal Uncle    Aneurysm Niece     Social History   Socioeconomic History   Marital status: Divorced    Spouse name: Not on file   Number of children: 2   Years of education: Not on file   Highest education level: Not on file  Occupational History   Occupation: Retired  Tobacco Use   Smoking status:  Former    Packs/day: 1.00    Years: 55.00    Total pack years: 55.00    Types: Cigarettes    Quit date: 2019    Years since quitting: 5.0    Passive exposure: Never   Smokeless tobacco: Never  Vaping Use   Vaping Use: Never used  Substance and Sexual Activity   Alcohol use: Yes    Comment: 3 beers daily   Drug use: No   Sexual activity: Not on file  Other Topics Concern   Not on file  Social History Narrative   Patient lives with his daughter   Social Determinants of Health   Financial Resource Strain: Not on file  Food Insecurity: Not on file  Transportation Needs: Not on file  Physical Activity: Not on file  Stress: Not on file  Social  Connections: Not on file   Review of Systems  Review of Systems: A 12 point ROS discussed and pertinent positives are indicated in the HPI above.  All other systems are negative.    Physical Exam No direct physical exam was performed (except for noted visual exam findings with Video Visits).   Vital Signs: There were no vitals taken for this visit.  Imaging: CT Angio Abd/Pel w/ and/or w/o  Result Date: 03/13/2022 CLINICAL DATA:  77 year old male with a history of abdominal aortic aneurysm status post endovascular aortic repair complicated by type 2 endoleak and enlargement of the aneurysm sac. He underwent endovascular repair on 06/30/2019. Continued follow-up evaluation. EXAM: CTA ABDOMEN AND PELVIS WITHOUT AND WITH CONTRAST TECHNIQUE: Multidetector CT imaging of the abdomen and pelvis was performed using the standard protocol during bolus administration of intravenous contrast. Multiplanar reconstructed images and MIPs were obtained and reviewed to evaluate the vascular anatomy. RADIATION DOSE REDUCTION: This exam was performed according to the departmental dose-optimization program which includes automated exposure control, adjustment of the mA and/or kV according to patient size and/or use of iterative reconstruction technique.  CONTRAST:  118m OMNIPAQUE IOHEXOL 350 MG/ML SOLN COMPARISON:  Prior CTA abdomen and pelvis 03/15/2021 FINDINGS: VASCULAR Aorta: Fusiform aneurysmal dilation of the abdominal aorta with postsurgical changes of endovascular aortic repair utilizing a bifurcated endoprosthesis extending into the common iliac arteries bilaterally as well as endovascular repair of type 2 endoleak with coil embolization of an accessory artery to the lower pole of the left kidney, liquid embolization of aneurysm sac and coil embolization of lumbar arteries. No evidence of residual arterial endoleak. Questionable trace delayed endoleak on delayed phase imaging in the superior aspect of the sac. The aneurysm sac measures 5.5 x 6.0 cm which is slightly smaller compared to 5.9 x 6.0 cm measured previously. Celiac: Patent without evidence of aneurysm, dissection, vasculitis or significant stenosis. SMA: Patent without evidence of aneurysm, dissection, vasculitis or significant stenosis. Renals: Single right renal artery. Calcified atherosclerotic plaque results in mild stenosis. Widely patent dominant left renal artery. Prior coil embolization of small accessory artery to the lower pole of the left kidney. IMA: The origin is embolized. The vessel reconstitutes via collateral flow. 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 Proximal Outflow: Bilateral common femoral and visualized portions of the superficial and profunda femoral arteries are patent without evidence of aneurysm, dissection, vasculitis or significant stenosis. Veins: No focal venous abnormality. Review of the MIP images confirms the above findings. NON-VASCULAR Lower chest: No acute abnormality. Paraseptal pulmonary emphysema. Dependent atelectasis. Hepatobiliary: No focal liver abnormality is seen. No gallstones, gallbladder wall thickening, or biliary dilatation. Pancreas: Unremarkable. No pancreatic ductal dilatation or surrounding  inflammatory changes. Spleen: Normal in size without focal abnormality. Adrenals/Urinary Tract: Normal adrenal glands. Stable benign simple renal cysts. No imaging follow-up recommended. Nonobstructing stone in the lower pole of the left kidney. Atrophy and hypoenhancement of the lower pole of the left kidney consistent with known coil embolization of accessory artery to the left lower pole. The ureters and bladder are unremarkable. Stomach/Bowel: No evidence of obstruction or focal bowel wall thickening. Normal appendix in the right lower quadrant. The terminal ileum is unremarkable. Lymphatic: No suspicious lymphadenopathy. Reproductive: Prostatomegaly. Other: No evidence of ascites. Fat containing umbilical and bilateral inguinal hernias. Unchanged. Musculoskeletal: No acute fracture or aggressive appearing lytic or blastic osseous lesion. Multilevel degenerative changes. IMPRESSION: VASCULAR 1. Abdominal aortic aneurysm status post endovascular aortic repair and endovascular  repair of type 2 endoleak. The excluded aneurysm sac remains stable to slightly decreased at 6.0 x 5.5 cm. Questionable persistent delayed endoleak in the superior aspect of the sac. No evidence of endoleak on arterial phase imaging. 2. 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 NON-VASCULAR 1. Ancillary findings as above without interval change. Aortic Atherosclerosis (ICD10-I70.0) and Emphysema (ICD10-J43.9). Signed, Criselda Peaches, MD, [place] [facility] Electronically Signed   By: Jacqulynn Cadet M.D.   On: 03/13/2022 14:57    Labs:  CBC: Recent Labs    10/04/21 1637  WBC 6.8  HGB 16.2  HCT 46.3  PLT 130*    COAGS: No results for input(s): "INR", "APTT" in the last 8760 hours.  BMP: Recent Labs    10/04/21 1637  NA 140  K 3.8  CL 102  CO2 24  GLUCOSE 85  BUN 14  CALCIUM 8.9  CREATININE 1.16    LIVER FUNCTION TESTS: No results  for input(s): "BILITOT", "AST", "ALT", "ALKPHOS", "PROT", "ALBUMIN" in the last 8760 hours.  TUMOR MARKERS: No results for input(s): "AFPTM", "CEA", "CA199", "CHROMGRNA" in the last 8760 hours.  Assessment and Plan:  Pleasant 77 year old gentleman with a history of infrarenal abdominal aortic aneurysm status post endovascular aortic repair complicated by a type II endoleak.  Patient has since undergone endoleak repair.  Most recent CT imaging demonstrates stability of the excluded aneurysm sac with perhaps a slight interval decrease in aneurysm size.  The endoleak remains just visible on delayed phase imaging.  We will continue with routine annual surveillance.  1.) CTA abd/pelvis in 1 year followed by clinic visit   Thank you for this interesting consult.  I greatly enjoyed meeting Obryan Radu and look forward to participating in their care.  A copy of this report was sent to the requesting provider on this date.  Electronically Signed: Criselda Peaches 03/25/2022, 12:36 PM   I spent a total of  10 Minutes in remote  clinical consultation, greater than 50% of which was counseling/coordinating care for AAA post EVAR with Type 2 endoleak.    Visit type: Audio only (telephone). Audio (no video) only due to patient preference. Alternative for in-person consultation at [facility], [address] [place], [place], [place]. This visit type was conducted due to national recommendations for restrictions regarding the COVID-19 Pandemic (e.g. social distancing).  This format is felt to be most appropriate for this patient at this time.  All issues noted in this document were discussed and addressed.

## 2022-03-31 DIAGNOSIS — Z8546 Personal history of malignant neoplasm of prostate: Secondary | ICD-10-CM | POA: Diagnosis not present

## 2022-04-11 ENCOUNTER — Ambulatory Visit
Admission: RE | Admit: 2022-04-11 | Discharge: 2022-04-11 | Disposition: A | Discharge status: Home / Self Care | Payer: Medicare Other | Source: Ambulatory Visit | Attending: Urology | Admitting: Urology

## 2022-04-11 DIAGNOSIS — Z8546 Personal history of malignant neoplasm of prostate: Secondary | ICD-10-CM | POA: Diagnosis not present

## 2022-04-11 MED ORDER — GADOPICLENOL 0.5 MMOL/ML IV SOLN
8.0000 mL | Freq: Once | INTRAVENOUS | Status: AC | PRN
  Administered 2022-04-11: 8 mL via INTRAVENOUS

## 2022-04-16 ENCOUNTER — Ambulatory Visit (HOSPITAL_BASED_OUTPATIENT_CLINIC_OR_DEPARTMENT_OTHER): Payer: Medicare Other

## 2022-04-18 DIAGNOSIS — R351 Nocturia: Secondary | ICD-10-CM | POA: Diagnosis not present

## 2022-04-18 DIAGNOSIS — N401 Enlarged prostate with lower urinary tract symptoms: Secondary | ICD-10-CM | POA: Diagnosis not present

## 2022-04-18 DIAGNOSIS — Z8546 Personal history of malignant neoplasm of prostate: Secondary | ICD-10-CM | POA: Diagnosis not present

## 2022-04-20 ENCOUNTER — Other Ambulatory Visit: Payer: Self-pay | Admitting: Physician Assistant

## 2022-05-20 ENCOUNTER — Ambulatory Visit (INDEPENDENT_AMBULATORY_CARE_PROVIDER_SITE_OTHER): Payer: Medicare Other | Admitting: Vascular Surgery

## 2022-05-20 DIAGNOSIS — I7143 Infrarenal abdominal aortic aneurysm, without rupture: Secondary | ICD-10-CM

## 2022-05-20 NOTE — Progress Notes (Signed)
Virtual Visit via Telephone Note    I connected with Juan Lynn on 05/20/2022 using the [email or web address] by telephone and verified that I was speaking with the correct person using two identifiers. Patient was located at home.  I am located at [facility] office.   The limitations of evaluation and management by telemedicine and the availability of in person appointments have been previously discussed with the patient and are documented in the patients chart. The patient expressed understanding and consented to proceed.  PCP: Kathyrn Lass, MD   Chief Complaint: 1 year interval follow-up for surveillance of stent graft of AAA with previous type II endoleak  History of Present Illness: Juan Lynn is a 77 y.o. male presents for 1 year interval follow-up for ongoing surveillance of his aortobiiliac stent graft.  He previously underwent repair of a 5.2 cm saccular aneurysm on 11/11/2017.  This had a type II endoleak.  He was ultimately sent to IR and had coiling of an accessory renal and lumbar on 06/30/2019 due to sac enlargement.  He had a follow-up CT on 04/16/2021.  States he has been having some mild back pain that responds to heat.  No other new complaints today.  Past Medical History:  Diagnosis Date   AAA (abdominal aortic aneurysm) (HCC)    BPH (benign prostatic hyperplasia)    CAD (coronary artery disease) 06/25/2012   a. Inf STEMI s/p cath- DES-mid RCA b. Echo- EF 55%, basal inferior HK   Cancer (HCC)    Carotid artery disease ([place])    Central sleep apnea    COPD (chronic obstructive pulmonary disease) (HCC)    Essential hypertension    History of heart attack    Hypercholesterolemia    Prostate cancer (HCC)    Tobacco abuse     Past Surgical History:  Procedure Laterality Date   ABDOMINAL AORTIC ENDOVASCULAR STENT GRAFT N/A 11/11/2017   Procedure: ABDOMINAL AORTIC ENDOVASCULAR STENT GRAFT;  Surgeon: Marty Heck, MD;  Location: [place];  Service: Vascular;  Laterality:  N/A;   CATARACT EXTRACTION  11/2019   CORONARY ANGIOPLASTY WITH STENT PLACEMENT  06/25/2012   100% mid RCA s/p DES, 30% prox AVG Cx, mild luminal irregularities elsewhere; EF 55%   HERNIA REPAIR     IR ANGIOGRAM PELVIS SELECTIVE OR SUPRASELECTIVE  06/30/2019   IR ANGIOGRAM SELECTIVE EACH ADDITIONAL VESSEL  06/30/2019   IR ANGIOGRAM SELECTIVE EACH ADDITIONAL VESSEL  06/30/2019   IR ANGIOGRAM SELECTIVE EACH ADDITIONAL VESSEL  06/30/2019   IR ANGIOGRAM VISCERAL SELECTIVE  06/30/2019   IR ANGIOGRAM VISCERAL SELECTIVE  06/30/2019   IR EMBO ARTERIAL NOT HEMORR HEMANG INC GUIDE ROADMAPPING  06/30/2019   IR RADIOLOGIST EVAL & MGMT  06/01/2019   IR RADIOLOGIST EVAL & MGMT  07/28/2019   IR RADIOLOGIST EVAL & MGMT  03/14/2020   IR RADIOLOGIST EVAL & MGMT  04/03/2021   IR RADIOLOGIST EVAL & MGMT  07/08/2021   IR US GUIDE VASC ACCESS RIGHT  06/30/2019   LEFT HEART CATHETERIZATION WITH CORONARY ANGIOGRAM N/A 06/25/2012   Procedure: LEFT HEART CATHETERIZATION WITH CORONARY ANGIOGRAM;  Surgeon: Burnell Blanks, MD;  Location: [facility] CATH LAB;  Service: Cardiovascular;  Laterality: N/A;   PERCUTANEOUS CORONARY STENT INTERVENTION (PCI-S)  06/25/2012   Procedure: PERCUTANEOUS CORONARY STENT INTERVENTION (PCI-S);  Surgeon: Burnell Blanks, MD;  Location: [facility] CATH LAB;  Service: Cardiovascular;;    No outpatient medications have been marked as taking for the 05/20/22 encounter (Appointment) with Marty Heck, MD.  12 system ROS was negative unless otherwise noted in HPI   Observations/Objective:  CTA abdomen pelvis from 03/13/2022 shows endograft in good position with coils in the accessory renal and lumbar.  Aneurysm sac is stable measuring 5.9 cm.  Assessment and Plan:  77 year old male that underwent aortobiiliac stent graft for a 5.2 cm saccular aneurysm on 11/11/2017.  Had a type II endoleak with enlarging aneurysm sac.  He underwent coil embolization of an accessory renal and lumbar  on 06/30/2019 with IR.  Discussed his repeat CTA on 03/13/2022 shows no significant interval growth.  I measure maximal sac size at 5.9 cm.  Will plan to see him again in 1 year with repeat CTA abdomen pelvis.  He is also being followed by Dr. Laurence Ferrari with IR and much appreciate his assistance.  Follow Up Instructions:   Follow up: 1 year with repeat CTA abdomen pelvis   I discussed the assessment and treatment plan with the patient. The patient was provided an opportunity to ask questions and all were answered. The patient agreed with the plan and demonstrated an understanding of the instructions.   The patient was advised to call back or seek an in-person evaluation if the symptoms worsen or if the condition fails to improve as anticipated.  I spent 5 minutes with the patient via telephone encounter.   Signed, Marty Heck [facility] [place] Office: [place]   05/20/2022, 3:01 PM

## 2022-07-07 ENCOUNTER — Other Ambulatory Visit: Payer: Self-pay | Admitting: Physician Assistant

## 2022-07-11 DIAGNOSIS — M79671 Pain in right foot: Secondary | ICD-10-CM | POA: Diagnosis not present

## 2022-07-11 DIAGNOSIS — E7800 Pure hypercholesterolemia, unspecified: Secondary | ICD-10-CM | POA: Diagnosis not present

## 2022-07-11 DIAGNOSIS — Z6836 Body mass index (BMI) 36.0-36.9, adult: Secondary | ICD-10-CM | POA: Diagnosis not present

## 2022-07-11 DIAGNOSIS — I779 Disorder of arteries and arterioles, unspecified: Secondary | ICD-10-CM | POA: Diagnosis not present

## 2022-07-11 DIAGNOSIS — Z8546 Personal history of malignant neoplasm of prostate: Secondary | ICD-10-CM | POA: Diagnosis not present

## 2022-07-11 DIAGNOSIS — I2510 Atherosclerotic heart disease of native coronary artery without angina pectoris: Secondary | ICD-10-CM | POA: Diagnosis not present

## 2022-07-11 DIAGNOSIS — Z79899 Other long term (current) drug therapy: Secondary | ICD-10-CM | POA: Diagnosis not present

## 2022-07-11 DIAGNOSIS — D7589 Other specified diseases of blood and blood-forming organs: Secondary | ICD-10-CM | POA: Diagnosis not present

## 2022-07-11 DIAGNOSIS — J439 Emphysema, unspecified: Secondary | ICD-10-CM | POA: Diagnosis not present

## 2022-07-11 DIAGNOSIS — N400 Enlarged prostate without lower urinary tract symptoms: Secondary | ICD-10-CM | POA: Diagnosis not present

## 2022-07-17 DIAGNOSIS — Z1389 Encounter for screening for other disorder: Secondary | ICD-10-CM | POA: Diagnosis not present

## 2022-07-17 DIAGNOSIS — Z0000 Encounter for general adult medical examination without abnormal findings: Secondary | ICD-10-CM | POA: Diagnosis not present

## 2022-07-17 DIAGNOSIS — E669 Obesity, unspecified: Secondary | ICD-10-CM | POA: Diagnosis not present

## 2022-08-29 DIAGNOSIS — M109 Gout, unspecified: Secondary | ICD-10-CM | POA: Diagnosis not present

## 2022-09-09 DIAGNOSIS — M109 Gout, unspecified: Secondary | ICD-10-CM | POA: Diagnosis not present

## 2022-09-09 DIAGNOSIS — M10072 Idiopathic gout, left ankle and foot: Secondary | ICD-10-CM | POA: Diagnosis not present

## 2022-10-08 DIAGNOSIS — Z8546 Personal history of malignant neoplasm of prostate: Secondary | ICD-10-CM | POA: Diagnosis not present

## 2022-10-25 ENCOUNTER — Other Ambulatory Visit: Payer: Self-pay | Admitting: Physician Assistant

## 2022-10-28 DIAGNOSIS — M109 Gout, unspecified: Secondary | ICD-10-CM | POA: Diagnosis not present

## 2022-10-28 LAB — LAB REPORT - SCANNED: EGFR: 78

## 2022-10-31 DIAGNOSIS — D692 Other nonthrombocytopenic purpura: Secondary | ICD-10-CM | POA: Diagnosis not present

## 2022-10-31 DIAGNOSIS — E669 Obesity, unspecified: Secondary | ICD-10-CM | POA: Diagnosis not present

## 2022-10-31 DIAGNOSIS — J449 Chronic obstructive pulmonary disease, unspecified: Secondary | ICD-10-CM | POA: Diagnosis not present

## 2022-10-31 DIAGNOSIS — M1000 Idiopathic gout, unspecified site: Secondary | ICD-10-CM | POA: Diagnosis not present

## 2022-10-31 DIAGNOSIS — C61 Malignant neoplasm of prostate: Secondary | ICD-10-CM | POA: Diagnosis not present

## 2022-10-31 DIAGNOSIS — K219 Gastro-esophageal reflux disease without esophagitis: Secondary | ICD-10-CM | POA: Diagnosis not present

## 2022-10-31 DIAGNOSIS — D696 Thrombocytopenia, unspecified: Secondary | ICD-10-CM | POA: Diagnosis not present

## 2022-10-31 DIAGNOSIS — K429 Umbilical hernia without obstruction or gangrene: Secondary | ICD-10-CM | POA: Diagnosis not present

## 2022-10-31 DIAGNOSIS — D7589 Other specified diseases of blood and blood-forming organs: Secondary | ICD-10-CM | POA: Diagnosis not present

## 2022-11-12 DIAGNOSIS — Z713 Dietary counseling and surveillance: Secondary | ICD-10-CM | POA: Diagnosis not present

## 2022-11-12 DIAGNOSIS — E7800 Pure hypercholesterolemia, unspecified: Secondary | ICD-10-CM | POA: Diagnosis not present

## 2022-11-12 DIAGNOSIS — G4733 Obstructive sleep apnea (adult) (pediatric): Secondary | ICD-10-CM | POA: Diagnosis not present

## 2022-11-12 NOTE — Progress Notes (Signed)
Cardiology Office Note    Date:  11/14/2022  ID:  Juan Lynn, DOB 12-24-1945, MRN [id number] PCP:  Sigmund Hazel, MD  Cardiologist:  Verne Carrow, MD  Electrophysiologist:  None   Chief Complaint: f/u CAD  History of Present Illness: .    Juan Lynn is a 77 y.o. male with visit-pertinent history of CAD (inferior STEMI 06/2012 s/p DES to RCA, mild residual Cx disease), COPD, central sleep apnea (compliant with BiPAP, followed @ [facility]), mild carotid artery disease, former tobacco abuse, habitual ETOH use (2-3 drinks nightly), prostate cancer and AAA s/p aortoiiliac endovascular repair September 2019 (followed by [facility]) who presents for follow-up. Last echo 11/2017 showed EF 55-60%, grade 1 DD, mild LAE. He is also followed by [facility] for history of AAA repair with known endoleak post-op with f/u IR repair in 06/2019. In summer 2021 he was having issues with headaches treated by primary care. CT head nonacute, + microvascular changes. Carotid US showed <50% RICA, approximately 50% LICA. Fortunately his headaches improved without specific intervention. Repeat carotid duplex 08/2020 near normal without significant abnormalities. Stress testing 10/2021 was normal. He was trialed on PPI which he no longer has to take. As previously noted, he is comfortable with medical terminology although does not have a medical background. Dr. Clifton James gave the OK to stop ASA and use Plavix monotherapy in 2022.  He is seen for follow-up overall doing well from cardiac standpoint. He denies any new CP, SOB, or cardiac concerns. He reports h/o component of white coat HTN and typically checks his BP before any OV and reports it was 113/72 this morning. This is also similar to when he's checked it at home in the past. He reports that he had bout of gout earlier this year requiring colchicine briefly and is now on allopurinol. He switched to light beer, cut down intake to 2 drinks per day, with improvement in symptoms. He  is not quite ready to decrease his ETOH further yet.  Labwork independently reviewed: 10/2022 Patient Copies K 4.2, Cr 1.0, LFTs ok, Hgb 14.9, plt 126 KPN 07/2022 Tchol 117, HLD 41, LDL 59, trig 81   ROS: .    Please see the history of present illness. All other systems are reviewed and otherwise negative.  Studies Reviewed: Marland Kitchen    EKG:  EKG is ordered today, personally reviewed, demonstrating NSR 74bpm, nonspecific TW changes similar to prior. No acute change.  CV Studies: Cardiac studies reviewed are outlined and summarized above. Otherwise please see EMR for full report.   Current Reported Medications:.    Current Meds  Medication Sig   allopurinol (ZYLOPRIM) 100 MG tablet Take 100 mg by mouth daily.   atorvastatin (LIPITOR) 80 MG tablet Take 1 tablet (80 mg total) by mouth at bedtime.   clopidogrel (PLAVIX) 75 MG tablet TAKE 1 TABLET AT BEDTIME   colchicine 0.6 MG tablet Take 0.6 mg by mouth daily as needed (gout).   finasteride (PROSCAR) 5 MG tablet Take 5 mg by mouth at bedtime.    fluticasone-salmeterol (ADVAIR HFA) 115-21 MCG/ACT inhaler 2 puffs   Fluticasone-Salmeterol (ADVAIR) 100-50 MCG/DOSE AEPB Inhale 1 puff into the lungs 2 (two) times daily as needed (shortness).   metoprolol succinate (TOPROL-XL) 50 MG 24 hr tablet TAKE 1 TABLET DAILY WITH OR IMMEDIATELY FOLLOWING A MEAL   Multiple Vitamin (MULTIVITAMIN WITH MINERALS) TABS tablet Take 1 tablet by mouth at bedtime. Centrum silver men   nitroGLYCERIN (NITROSTAT) 0.4 MG SL tablet Place 1 tablet (0.4  mg total) under the tongue every 5 (five) minutes as needed for chest pain.   pantoprazole (PROTONIX) 40 MG tablet Take 1 tablet (40 mg total) by mouth daily. FUTURE REFILLS SHOULD COME FROM PCP   tamsulosin (FLOMAX) 0.4 MG CAPS Take 0.8 mg by mouth at bedtime.     Physical Exam:    VS:  BP 132/70   Pulse 62   Ht 5' 7.5" (1.715 m)   Wt 178 lb (80.7 kg)   SpO2 95%   BMI 27.47 kg/m    Wt Readings from Last 3  Encounters:  11/14/22 178 lb (80.7 kg)  02/19/22 195 lb (88.5 kg)  10/29/21 198 lb (89.8 kg)    GEN: Well nourished, well developed in no acute distress NECK: No JVD; No carotid bruits CARDIAC: RRR, no murmurs, rubs, gallops RESPIRATORY:  Clear to auscultation without rales, wheezing or rhonchi  ABDOMEN: Soft, non-tender, non-distended EXTREMITIES:  No edema; No acute deformity   Asessement and Plan:.    1. CAD - doing well without recent anginal symptoms. Low risk stress test last year. He inquires about whether he can come off of some of his heart medicines. I think he is on the basic necessity at present time and therefore would not make any changes. Refill Plavix 75mg  daily (monotherapy per prior review with Dr. Clifton James), Toprol 50mg  daily, and atorvastatin 80mg  daily. We did review d/d/I between colchicine and statin - if he has to take colchicine in the future for any prolonged course will need to notify prescriber as his statin dose may need to be adjusted. Last labs from PCP reviewed as well. Will scan into chart.  2. AAA - continue follow-up with [facility], IR.  3. Essential HTN - SBP top-normal today, reports normal blood pressure by home values, therefore no changes made today. Continue Toprol.  4. Mild carotid artery disease - near normal by last check 2022. No bruits heard on examination. Given the stability between the last two prior studies, will reserve repeat imaging as PRN going forward. Continue statin.  5. Thrombocytopenia - chronic for patient. He denies prior hematology eval and would like to pursue this. Will refer. He has h/o easy bruising but no more significant bleeding recently. Remains on Plavix as part of medical therapy for CAD.    Disposition: F/u with Dr. Clifton James or myself in 1 year.  Signed, Laurann Montana, PA-C

## 2022-11-14 ENCOUNTER — Encounter: Payer: Self-pay | Admitting: Physician Assistant

## 2022-11-14 ENCOUNTER — Other Ambulatory Visit: Payer: Self-pay

## 2022-11-14 ENCOUNTER — Ambulatory Visit: Payer: Medicare Other | Attending: Physician Assistant | Admitting: Physician Assistant

## 2022-11-14 VITALS — BP 132/70 | HR 62 | Ht 67.5 in | Wt 178.0 lb

## 2022-11-14 DIAGNOSIS — I2510 Atherosclerotic heart disease of native coronary artery without angina pectoris: Secondary | ICD-10-CM | POA: Diagnosis not present

## 2022-11-14 DIAGNOSIS — I10 Essential (primary) hypertension: Secondary | ICD-10-CM | POA: Diagnosis not present

## 2022-11-14 DIAGNOSIS — I779 Disorder of arteries and arterioles, unspecified: Secondary | ICD-10-CM | POA: Insufficient documentation

## 2022-11-14 DIAGNOSIS — D696 Thrombocytopenia, unspecified: Secondary | ICD-10-CM | POA: Diagnosis not present

## 2022-11-14 DIAGNOSIS — I714 Abdominal aortic aneurysm, without rupture, unspecified: Secondary | ICD-10-CM | POA: Insufficient documentation

## 2022-11-14 MED ORDER — CLOPIDOGREL BISULFATE 75 MG PO TABS: ORAL_TABLET | ORAL | 3 refills | Status: DC

## 2022-11-14 MED ORDER — ATORVASTATIN CALCIUM 80 MG PO TABS: 80.0000 mg | ORAL_TABLET | Freq: Every day | ORAL | 3 refills | Status: DC

## 2022-11-14 MED ORDER — NITROGLYCERIN 0.4 MG SL SUBL: 0.4000 mg | SUBLINGUAL_TABLET | SUBLINGUAL | 3 refills | Status: DC | PRN

## 2022-11-14 MED ORDER — METOPROLOL SUCCINATE ER 50 MG PO TB24: ORAL_TABLET | ORAL | 3 refills | Status: DC

## 2022-11-14 NOTE — Patient Instructions (Signed)
Medication Instructions:  Your physician recommends that you continue on your current medications as directed. Please refer to the Current Medication list given to you today.  *If you need a refill on your cardiac medications before your next appointment, please call your pharmacy*  Lab Work: None ordered today.  Testing/Procedures: None ordered today.  Follow-Up: At [facility], you and your health needs are our priority.  As part of our continuing mission to provide you with exceptional heart care, we have created designated Provider Care Teams.  These Care Teams include your primary Cardiologist (physician) and Advanced Practice Providers (APPs -  Physician Assistants and Nurse Practitioners) who all work together to provide you with the care you need, when you need it.  Your next appointment:   12 month(s)  The format for your next appointment:   In Person  Provider:   Verne Carrow, MD  or Ronie Spies, PA-C   Other Instructions You have been referred to a hematologist at our [place] location. A scheduler will call you to schedule an appointment.  [facility] [address] [place], [place] [address] [phone] (office)

## 2023-02-02 ENCOUNTER — Other Ambulatory Visit: Payer: Self-pay | Admitting: *Deleted

## 2023-02-02 DIAGNOSIS — D696 Thrombocytopenia, unspecified: Secondary | ICD-10-CM

## 2023-02-06 ENCOUNTER — Inpatient Hospital Stay: Payer: Medicare Other

## 2023-02-06 ENCOUNTER — Encounter: Payer: Self-pay | Admitting: Nurse Practitioner

## 2023-02-06 ENCOUNTER — Telehealth: Payer: Self-pay

## 2023-02-06 ENCOUNTER — Inpatient Hospital Stay: Payer: Medicare Other | Attending: Nurse Practitioner | Admitting: Nurse Practitioner

## 2023-02-06 VITALS — BP 124/82 | HR 71 | Temp 98.1°F | Resp 18 | Ht 67.5 in | Wt 187.2 lb

## 2023-02-06 DIAGNOSIS — M109 Gout, unspecified: Secondary | ICD-10-CM | POA: Diagnosis not present

## 2023-02-06 DIAGNOSIS — D7589 Other specified diseases of blood and blood-forming organs: Secondary | ICD-10-CM | POA: Diagnosis not present

## 2023-02-06 DIAGNOSIS — D696 Thrombocytopenia, unspecified: Secondary | ICD-10-CM

## 2023-02-06 DIAGNOSIS — Z8546 Personal history of malignant neoplasm of prostate: Secondary | ICD-10-CM | POA: Insufficient documentation

## 2023-02-06 LAB — CBC WITH DIFFERENTIAL (CANCER CENTER ONLY)
Abs Immature Granulocytes: 0.01 10*3/uL (ref 0.00–0.07)
Basophils Absolute: 0 10*3/uL (ref 0.0–0.1)
Basophils Relative: 0 %
Eosinophils Absolute: 0.1 10*3/uL (ref 0.0–0.5)
Eosinophils Relative: 2 %
HCT: 44.2 % (ref 39.0–52.0)
Hemoglobin: 15.4 g/dL (ref 13.0–17.0)
Immature Granulocytes: 0 %
Lymphocytes Relative: 16 %
Lymphs Abs: 0.9 10*3/uL (ref 0.7–4.0)
MCH: 33.8 pg (ref 26.0–34.0)
MCHC: 34.8 g/dL (ref 30.0–36.0)
MCV: 97.1 fL (ref 80.0–100.0)
Monocytes Absolute: 0.5 10*3/uL (ref 0.1–1.0)
Monocytes Relative: 8 %
Neutro Abs: 4.4 10*3/uL (ref 1.7–7.7)
Neutrophils Relative %: 74 %
Platelet Count: 140 10*3/uL — ABNORMAL LOW (ref 150–400)
RBC: 4.55 MIL/uL (ref 4.22–5.81)
RDW: 12.6 % (ref 11.5–15.5)
WBC Count: 5.9 10*3/uL (ref 4.0–10.5)
nRBC: 0 % (ref 0.0–0.2)

## 2023-02-06 LAB — URIC ACID: Uric Acid, Serum: 6.1 mg/dL (ref 3.7–8.6)

## 2023-02-06 LAB — SAVE SMEAR(SSMR), FOR PROVIDER SLIDE REVIEW

## 2023-02-06 LAB — VITAMIN B12: Vitamin B-12: 489 pg/mL (ref 180–914)

## 2023-02-06 NOTE — Telephone Encounter (Signed)
Juan Lynn from [facility] lab states he can add on uric acid and B-12 lab on.

## 2023-02-06 NOTE — Progress Notes (Signed)
New Hematology/Oncology Consult   Requesting MD: Ronie Spies, [place]  [phone]  Reason for Consult: Chronic thrombocytopenia  HPI: Juan Lynn is a 77 year old man referred for evaluation of thrombocytopenia.  Most recent CBC in the EMR 10/28/2022-hemoglobin 14.9, MCV 100 white count 5.8, platelet count 126,000.  CBC from 10/04/2021 shows a platelet count of 130,000; 10/08/2020 platelet count 136,000; 11/13/2019 platelet count 171,000; 06/10/2019 platelet count 142,000; 11/12/2017 platelet count 109,000.  Most remote CBC in the EMR shows a platelet count of 112,000 on 06/25/2012.  Unremarkable chemistry panel on 10/28/2022.     Past Medical History:  Diagnosis Date   AAA (abdominal aortic aneurysm) (HCC)    BPH (benign prostatic hyperplasia)    CAD (coronary artery disease) 06/25/2012   a. Inf STEMI s/p cath- DES-mid RCA b. Echo- EF 55%, basal inferior HK   Cancer (HCC)    Carotid artery disease (HCC)    Central sleep apnea    COPD (chronic obstructive pulmonary disease) (HCC)    Essential hypertension    History of heart attack    Hypercholesterolemia    Prostate cancer (HCC)    Tobacco abuse      Past Surgical History:  Procedure Laterality Date   ABDOMINAL AORTIC ENDOVASCULAR STENT GRAFT N/A 11/11/2017   Procedure: ABDOMINAL AORTIC ENDOVASCULAR STENT GRAFT;  Surgeon: Cephus Shelling, MD;  Location: [facility] OR;  Service: Vascular;  Laterality: N/A;   CATARACT EXTRACTION  11/2019   CORONARY ANGIOPLASTY WITH STENT PLACEMENT  06/25/2012   100% mid RCA s/p DES, 30% prox AVG Cx, mild luminal irregularities elsewhere; EF 55%   HERNIA REPAIR     IR ANGIOGRAM PELVIS SELECTIVE OR SUPRASELECTIVE  06/30/2019   IR ANGIOGRAM SELECTIVE EACH ADDITIONAL VESSEL  06/30/2019   IR ANGIOGRAM SELECTIVE EACH ADDITIONAL VESSEL  06/30/2019   IR ANGIOGRAM SELECTIVE EACH ADDITIONAL VESSEL  06/30/2019   IR ANGIOGRAM VISCERAL SELECTIVE  06/30/2019   IR ANGIOGRAM VISCERAL SELECTIVE  06/30/2019   IR EMBO  ARTERIAL NOT HEMORR HEMANG INC GUIDE ROADMAPPING  06/30/2019   IR RADIOLOGIST EVAL & MGMT  06/01/2019   IR RADIOLOGIST EVAL & MGMT  07/28/2019   IR RADIOLOGIST EVAL & MGMT  03/14/2020   IR RADIOLOGIST EVAL & MGMT  04/03/2021   IR RADIOLOGIST EVAL & MGMT  07/08/2021   IR US GUIDE VASC ACCESS RIGHT  06/30/2019   LEFT HEART CATHETERIZATION WITH CORONARY ANGIOGRAM N/A 06/25/2012   Procedure: LEFT HEART CATHETERIZATION WITH CORONARY ANGIOGRAM;  Surgeon: Kathleene Hazel, MD;  Location: [facility] CATH LAB;  Service: Cardiovascular;  Laterality: N/A;   PERCUTANEOUS CORONARY STENT INTERVENTION (PCI-S)  06/25/2012   Procedure: PERCUTANEOUS CORONARY STENT INTERVENTION (PCI-S);  Surgeon: Kathleene Hazel, MD;  Location: [facility] CATH LAB;  Service: Cardiovascular;;     Current Outpatient Medications:    allopurinol (ZYLOPRIM) 100 MG tablet, Take 100 mg by mouth daily., Disp: , Rfl:    atorvastatin (LIPITOR) 80 MG tablet, Take 1 tablet (80 mg total) by mouth at bedtime., Disp: 90 tablet, Rfl: 3   clopidogrel (PLAVIX) 75 MG tablet, TAKE 1 TABLET AT BEDTIME, Disp: 90 tablet, Rfl: 3   colchicine 0.6 MG tablet, Take 0.6 mg by mouth daily as needed (gout)., Disp: , Rfl:    finasteride (PROSCAR) 5 MG tablet, Take 5 mg by mouth at bedtime. , Disp: , Rfl:    fluticasone-salmeterol (ADVAIR HFA) 115-21 MCG/ACT inhaler, 2 puffs, Disp: , Rfl:    Fluticasone-Salmeterol (ADVAIR) 100-50 MCG/DOSE AEPB, Inhale 1 puff into the lungs  2 (two) times daily as needed (shortness)., Disp: , Rfl:    metoprolol succinate (TOPROL-XL) 50 MG 24 hr tablet, TAKE 1 TABLET DAILY WITH OR IMMEDIATELY FOLLOWING A MEAL, Disp: 90 tablet, Rfl: 3   Multiple Vitamin (MULTIVITAMIN WITH MINERALS) TABS tablet, Take 1 tablet by mouth at bedtime. Centrum silver men, Disp: , Rfl:    nitroGLYCERIN (NITROSTAT) 0.4 MG SL tablet, Place 1 tablet (0.4 mg total) under the tongue every 5 (five) minutes as needed for chest pain., Disp: 25 tablet, Rfl: 3    pantoprazole (PROTONIX) 40 MG tablet, Take 1 tablet (40 mg total) by mouth daily. FUTURE REFILLS SHOULD COME FROM PCP, Disp: 30 tablet, Rfl: 0   tamsulosin (FLOMAX) 0.4 MG CAPS, Take 0.8 mg by mouth at bedtime. , Disp: , Rfl: :    No Known Allergies:  FH: Father with history of ITP, deceased with pancreatic cancer age 17; mother had a stroke age 30, died age 68; 2 brothers and 2 sisters alive and well.  SOCIAL HISTORY: He lives locally.  He has 4 children.  He is retired from [employer].  He quit smoking in 2020 at 1 pack/day for 50+ years.  He is in the lung cancer screening program.  He reports a history of heavy alcohol use as much as a 12 pack of beer per day.  Over the summer he decreased alcohol intake from 4-5 beers per day x 10 years to 2 beers a day.  Review of Systems: No fevers or sweats.  He has a good appetite.  No bleeding.  He does note easy bruising.  No unintentional weight loss.  He reports a recent gout flare in the left foot, now on allopurinol.  He has occasional choking when he eats.  He has intermittent dyspnea on exertion and a cough.  He attributes this to emphysema.  No change in bowel habits.  No bloody or black stools.  No urinary symptoms.  He has BPH.  He reports a history of prostate cancer being followed with surveillance.  He describes a "touch of neuropathy" in his feet.  He reports enlarged axillary lymph nodes bilaterally for the past 20 years.  He denies a history of liver disease.  Physical Exam:  Blood pressure 124/82, pulse 71, temperature 98.1 F (36.7 C), temperature source Temporal, resp. rate 18, height 5' 7.5" (1.715 m), weight 187 lb 3.2 oz (84.9 kg), SpO2 96%.  HEENT: No thrush or ulcers. Lungs: Distant breath sounds.  No respiratory distress. Cardiac: Regular rate and rhythm. Abdomen: No hepatosplenomegaly. Vascular: No leg edema.  Brown discoloration at the lower leg bilaterally.  Prominent vein pattern over the abdominal wall. Lymph nodes: No  palpable cervical, supraclavicular, axillary or inguinal lymph nodes.  Large soft area of fullness right posterior axilla, likely a lipoma. Neurologic: Alert and oriented. Skin: No rash.  Small superficially dilated blood vessels neck, chest and upper back.  LABS:   Recent Labs    02/06/23 1107  WBC 5.9  HGB 15.4  HCT 44.2  PLT 140*  Peripheral blood smear-polychromasia not increased, red cells appear normocytic and normochromic; white cell morphology unremarkable; platelets appear mildly decreased.  No results for input(s): "NA", "K", "CL", "CO2", "GLUCOSE", "BUN", "CREATININE", "CALCIUM" in the last 72 hours.    RADIOLOGY:  No results found.  Assessment and Plan:   Chronic thrombocytopenia Mild red cell macrocytosis CAD/MI 2014 COPD History of prostate cancer Gout Abdominal aortic aneurysm  Juan Lynn has chronic mild thrombocytopenia dating to  at least 2014.  We reviewed various potential etiologies including benign normal variant, chronic ITP, effect of alcohol, underlying liver disease.  A CT scan from 03/13/2022 did not show evidence of cirrhosis.  He has a mild red cell macrocytosis.  We will check a B12 level.    We did not schedule additional follow-up in the hematology clinic.  We recommend an annual CBC.  We are available to see him in the future if there is a decline in the platelet count or he develops a new hematologic abnormality.  Patient seen with Dr. Truett Perna.  Lonna Cobb, NP 02/06/2023, 12:23 PM  This was a shared visit with Lonna Cobb.  Juan Lynn was interviewed and examined.  I reviewed the peripheral blood smear.  He is referred for evaluation of thrombocytopenia.  He appears to have chronic mild thrombocytopenia.  He has a history of intermittent Red cell macrocytosis.  The hematologic findings could be related to a benign normal variant, chronic ITP, or undiagnosed liver disease.  The differential diagnosis includes early myelodysplasia.  The  platelet count is at the low end of normal range today.  Juan Lynn will continue follow-up with Dr. Hyacinth Meeker.  I recommend a CBC every 6-12 months.  We will be glad to see him again if he develops progressive thrombocytopenia or a new hematologic abnormality.  I recommend Dr. Hyacinth Meeker consider further evaluation to rule out underlying liver disease.  I was present for greater than 50% of today's visit.  I performed medical decision making.  Mancel Bale, MD

## 2023-02-09 ENCOUNTER — Telehealth: Payer: Self-pay

## 2023-02-09 NOTE — Telephone Encounter (Signed)
-----   Message from Lonna Cobb sent at 02/09/2023 12:45 PM EST ----- Please let him know the B12 level is normal.  Follow-up as needed.

## 2023-02-09 NOTE — Telephone Encounter (Signed)
Patient gave verbal understanding and had no further questions or concerns  

## 2023-02-09 NOTE — Telephone Encounter (Signed)
-----   Message from Lonna Cobb sent at 02/06/2023  4:58 PM EST ----- Please forward the uric acid level from 02/06/2023 to Dr. Sigmund Hazel.  Thanks

## 2023-02-09 NOTE — Telephone Encounter (Signed)
I forward the uric acid level to Dr. Sigmund Hazel

## 2023-02-18 DIAGNOSIS — H35371 Puckering of macula, right eye: Secondary | ICD-10-CM | POA: Diagnosis not present

## 2023-02-18 DIAGNOSIS — Z961 Presence of intraocular lens: Secondary | ICD-10-CM | POA: Diagnosis not present

## 2023-02-18 DIAGNOSIS — H52203 Unspecified astigmatism, bilateral: Secondary | ICD-10-CM | POA: Diagnosis not present

## 2023-02-19 ENCOUNTER — Other Ambulatory Visit: Payer: Self-pay | Admitting: Interventional Radiology

## 2023-02-19 DIAGNOSIS — I9789 Other postprocedural complications and disorders of the circulatory system, not elsewhere classified: Secondary | ICD-10-CM

## 2023-02-23 ENCOUNTER — Ambulatory Visit (HOSPITAL_BASED_OUTPATIENT_CLINIC_OR_DEPARTMENT_OTHER)
Admission: RE | Admit: 2023-02-23 | Discharge: 2023-02-23 | Disposition: A | Discharge status: Home / Self Care | Payer: Medicare Other | Source: Ambulatory Visit | Attending: Acute Care | Admitting: Acute Care

## 2023-02-23 DIAGNOSIS — Z122 Encounter for screening for malignant neoplasm of respiratory organs: Secondary | ICD-10-CM | POA: Diagnosis not present

## 2023-02-23 DIAGNOSIS — Z87891 Personal history of nicotine dependence: Secondary | ICD-10-CM | POA: Insufficient documentation

## 2023-03-10 ENCOUNTER — Other Ambulatory Visit: Payer: Self-pay

## 2023-03-10 DIAGNOSIS — Z122 Encounter for screening for malignant neoplasm of respiratory organs: Secondary | ICD-10-CM

## 2023-03-10 DIAGNOSIS — Z87891 Personal history of nicotine dependence: Secondary | ICD-10-CM

## 2023-03-19 ENCOUNTER — Ambulatory Visit (HOSPITAL_COMMUNITY)
Admission: RE | Admit: 2023-03-19 | Discharge: 2023-03-19 | Disposition: A | Discharge status: Home / Self Care | Payer: Medicare Other | Source: Ambulatory Visit | Attending: Interventional Radiology | Admitting: Interventional Radiology

## 2023-03-19 DIAGNOSIS — I723 Aneurysm of iliac artery: Secondary | ICD-10-CM | POA: Diagnosis not present

## 2023-03-19 DIAGNOSIS — K4020 Bilateral inguinal hernia, without obstruction or gangrene, not specified as recurrent: Secondary | ICD-10-CM | POA: Diagnosis not present

## 2023-03-19 DIAGNOSIS — I9789 Other postprocedural complications and disorders of the circulatory system, not elsewhere classified: Secondary | ICD-10-CM | POA: Insufficient documentation

## 2023-03-19 DIAGNOSIS — N200 Calculus of kidney: Secondary | ICD-10-CM | POA: Diagnosis not present

## 2023-03-19 DIAGNOSIS — I7143 Infrarenal abdominal aortic aneurysm, without rupture: Secondary | ICD-10-CM | POA: Diagnosis not present

## 2023-03-19 MED ORDER — IOHEXOL 350 MG/ML SOLN
100.0000 mL | Freq: Once | INTRAVENOUS | Status: AC | PRN
  Administered 2023-03-19: 100 mL via INTRAVENOUS

## 2023-04-02 ENCOUNTER — Ambulatory Visit
Admission: RE | Admit: 2023-04-02 | Discharge: 2023-04-02 | Disposition: A | Discharge status: Home / Self Care | Payer: Medicare Other | Source: Ambulatory Visit | Attending: Interventional Radiology | Admitting: Interventional Radiology

## 2023-04-02 DIAGNOSIS — T82330D Leakage of aortic (bifurcation) graft (replacement), subsequent encounter: Secondary | ICD-10-CM | POA: Diagnosis not present

## 2023-04-02 DIAGNOSIS — I9789 Other postprocedural complications and disorders of the circulatory system, not elsewhere classified: Secondary | ICD-10-CM

## 2023-04-02 HISTORY — PX: IR RADIOLOGIST EVAL & MGMT: IMG5224

## 2023-04-02 NOTE — Progress Notes (Signed)
Chief Complaint: Patient was consulted remotely today (TeleHealth) for type 2 endoleak at the request of Juan Lynn.    Referring Physician(s): Juan Lynn (via Dr. Clotilde Dieter)  History of Present Illness: Juan Lynn is a 78 y.o. 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   Juan Lynn underwent endovascular repair on 07/01/2019.  He was found to have inflow via the right L4 lumbar artery and outflow via an accessory artery to the lower pole of the left kidney.  His procedure was a success with coil embolization of both the outflow and inflow arteries as well as liquid embolic embolization of the Endosac itself.    CTA 03/09/20 No persisting endoleak, status post embolization. The estimated diameter of the excluded aneurysm sac is unchanged from the pre embolization CT study.   CTA 03/15/21 Aortic aneurysm sac has minimally enlarged since 03/09/2020. Aortic aneurysm sac measures 5.9 x 6.0 cm. The configuration of the aneurysm sac has not changed. There is concern for a type 2 endoleak along the superior aspect of the aneurysm sac seen only on the delayed images. This area is difficult to evaluate due to streak artifact from the previous embolization procedures.   CTA 03/13/22 Abdominal aortic aneurysm status post endovascular aortic repair and endovascular repair of type 2 endoleak. The excluded aneurysm sac remains stable to slightly decreased at 6.0 x 5.5 cm.  Questionable persistent delayed endoleak in the superior aspect of the sac. No evidence of endoleak on arterial phase imaging.  CTA 03/23/2023 1. Postsurgical changes of endovascular aortic repair of infrarenal abdominal aortic aneurysm and endovascular repair of type 2 endoleak with a persistent subtle delayed type 2 endoleak in the superior aspect of the aneurysm  sac. 2. Small incremental enlargement of excluded aneurysm sac now measuring 6.1 x 5.7 cm compared to 6.0 x 5.5 cm previously. Recommend continued attention on follow-up imaging.   Today we meet over the phone.     He is doing well.  He denies any abdominal or flank pain.    Past Medical History:  Diagnosis Date   AAA (abdominal aortic aneurysm) (HCC)    BPH (benign prostatic hyperplasia)    CAD (coronary artery disease) 06/25/2012   a. Inf STEMI s/p cath- DES-mid RCA b. Echo- EF 55%, basal inferior HK   Cancer (HCC)    Carotid artery disease (HCC)    Central sleep apnea    COPD (chronic obstructive pulmonary disease) (HCC)    Essential hypertension    History of heart attack    Hypercholesterolemia    Prostate cancer (HCC)    Tobacco abuse     Past Surgical History:  Procedure Laterality Date   ABDOMINAL AORTIC ENDOVASCULAR STENT GRAFT N/A 11/11/2017   Procedure: ABDOMINAL AORTIC ENDOVASCULAR STENT GRAFT;  Surgeon: Juan Shelling, MD;  Location: [facility] OR;  Service: Vascular;  Laterality: N/A;   CATARACT EXTRACTION  11/2019   CORONARY ANGIOPLASTY WITH STENT PLACEMENT  06/25/2012   100% mid RCA s/p DES, 30% prox AVG Cx, mild luminal irregularities elsewhere; EF 55%   HERNIA REPAIR     IR ANGIOGRAM PELVIS SELECTIVE OR SUPRASELECTIVE  06/30/2019   IR ANGIOGRAM SELECTIVE EACH ADDITIONAL VESSEL  06/30/2019   IR ANGIOGRAM SELECTIVE EACH ADDITIONAL VESSEL  06/30/2019   IR ANGIOGRAM SELECTIVE EACH ADDITIONAL VESSEL  06/30/2019   IR ANGIOGRAM VISCERAL SELECTIVE  06/30/2019  IR ANGIOGRAM VISCERAL SELECTIVE  06/30/2019   IR EMBO ARTERIAL NOT HEMORR HEMANG INC GUIDE ROADMAPPING  06/30/2019   IR RADIOLOGIST EVAL & MGMT  06/01/2019   IR RADIOLOGIST EVAL & MGMT  07/28/2019   IR RADIOLOGIST EVAL & MGMT  03/14/2020   IR RADIOLOGIST EVAL & MGMT  04/03/2021   IR RADIOLOGIST EVAL & MGMT  07/08/2021   IR RADIOLOGIST EVAL & MGMT  04/02/2023   IR US GUIDE VASC ACCESS RIGHT  06/30/2019   LEFT  HEART CATHETERIZATION WITH CORONARY ANGIOGRAM N/A 06/25/2012   Procedure: LEFT HEART CATHETERIZATION WITH CORONARY ANGIOGRAM;  Surgeon: Kathleene Hazel, MD;  Location: [facility] CATH LAB;  Service: Cardiovascular;  Laterality: N/A;   PERCUTANEOUS CORONARY STENT INTERVENTION (PCI-S)  06/25/2012   Procedure: PERCUTANEOUS CORONARY STENT INTERVENTION (PCI-S);  Surgeon: Kathleene Hazel, MD;  Location: [facility] CATH LAB;  Service: Cardiovascular;;    Allergies: Patient has no known allergies.  Medications: Prior to Admission medications   Medication Sig Start Date End Date Taking? Authorizing Provider  allopurinol (ZYLOPRIM) 100 MG tablet Take 100 mg by mouth daily. 09/10/22   [provider]  atorvastatin (LIPITOR) 80 MG tablet Take 1 tablet (80 mg total) by mouth at bedtime. 11/14/22   Dunn, Tacey Ruiz, PA-C  clopidogrel (PLAVIX) 75 MG tablet TAKE 1 TABLET AT BEDTIME 11/14/22   Dunn, Kriste Basque N, PA-C  colchicine 0.6 MG tablet Take 0.6 mg by mouth daily as needed (gout). 09/09/22   [provider]  finasteride (PROSCAR) 5 MG tablet Take 5 mg by mouth at bedtime.     [provider]  fluticasone-salmeterol (ADVAIR HFA) 161-09 MCG/ACT inhaler 2 puffs 02/13/21   [provider]  Fluticasone-Salmeterol (ADVAIR) 100-50 MCG/DOSE AEPB Inhale 1 puff into the lungs 2 (two) times daily as needed (shortness).    [provider]  metoprolol succinate (TOPROL-XL) 50 MG 24 hr tablet TAKE 1 TABLET DAILY WITH OR IMMEDIATELY FOLLOWING A MEAL 11/14/22   Dunn, Tacey Ruiz, PA-C  Multiple Vitamin (MULTIVITAMIN WITH MINERALS) TABS tablet Take 1 tablet by mouth at bedtime. Centrum silver men    [provider]  nitroGLYCERIN (NITROSTAT) 0.4 MG SL tablet Place 1 tablet (0.4 mg total) under the tongue every 5 (five) minutes as needed for chest pain. 11/14/22   Dunn, Tacey Ruiz, PA-C  pantoprazole (PROTONIX) 40 MG tablet Take 1 tablet (40 mg total) by mouth daily. FUTURE REFILLS SHOULD  COME FROM PCP 07/07/22   Laurann Montana, PA-C  tamsulosin (FLOMAX) 0.4 MG CAPS Take 0.8 mg by mouth at bedtime.     [provider]     Family History  Problem Relation Age of Onset   Heart attack Father    CAD Father    Pancreatic cancer Father    Aneurysm Sister    Aneurysm Brother    Emphysema Maternal Grandfather        smoked heavily    Stomach cancer Paternal Grandmother    Prostate cancer Paternal Uncle    Aneurysm Niece     Social History   Socioeconomic History   Marital status: Divorced    Spouse name: Not on file   Number of children: 2   Years of education: Not on file   Highest education level: Not on file  Occupational History   Occupation: Retired  Tobacco Use   Smoking status: Former    Current packs/day: 0.00    Average packs/day: 1 pack/day for 55.0 years (55.0 ttl pk-yrs)  Types: Cigarettes    Start date: 1964    Quit date: 2019    Years since quitting: 6.0    Passive exposure: Never   Smokeless tobacco: Never  Vaping Use   Vaping status: Never Used  Substance and Sexual Activity   Alcohol use: Yes    Comment: 3 beers daily   Drug use: No   Sexual activity: Not on file  Other Topics Concern   Not on file  Social History Narrative   Patient lives with his daughter   Social Drivers of Health   Financial Resource Strain: Low Risk  (02/06/2023)   Overall Financial Resource Strain (CARDIA)    Difficulty of Paying Living Expenses: Not hard at all  Food Insecurity: No Food Insecurity (02/06/2023)   Hunger Vital Sign    Worried About Running Out of Food in the Last Year: Never true    Ran Out of Food in the Last Year: Never true  Transportation Needs: No Transportation Needs (02/06/2023)   PRAPARE - Administrator, Civil Service (Medical): No    Lack of Transportation (Non-Medical): No  Physical Activity: Inactive (02/06/2023)   Exercise Vital Sign    Days of Exercise per Week: 0 days    Minutes of Exercise per Session: 0 min   Stress: No Stress Concern Present (02/06/2023)   [employer] of Occupational Health - Occupational Stress Questionnaire    Feeling of Stress : Not at all  Social Connections: Moderately Integrated (02/06/2023)   Social Connection and Isolation Panel [NHANES]    Frequency of Communication with Friends and Family: Three times a week    Frequency of Social Gatherings with Friends and Family: Three times a week    Attends Religious Services: More than 4 times per year    Active Member of Clubs or Organizations: Yes    Attends Banker Meetings: More than 4 times per year    Marital Status: Divorced    Review of Systems  Review of Systems: A 12 point ROS discussed and pertinent positives are indicated in the HPI above.  All other systems are negative.  Advance Care Plan: The advanced care plan/surrogate decision maker was discussed at the time of visit and the patient did not wish to discuss or was not able to name a surrogate decision maker or provide an advance care plan.    Physical Exam No direct physical exam was performed (except for noted visual exam findings with Video Visits).    Vital Signs: There were no vitals taken for this visit.  Imaging: IR Radiologist Eval & Mgmt Result Date: 04/02/2023 EXAM: ESTABLISHED PATIENT OFFICE VISIT CHIEF COMPLAINT: SEE EPIC NOTE HISTORY OF PRESENT ILLNESS: SEE EPIC NOTE REVIEW OF SYSTEMS: SEE EPIC NOTE PHYSICAL EXAMINATION: SEE EPIC NOTE ASSESSMENT AND PLAN: SEE EPIC NOTE Electronically Signed   By: Malachy Moan M.D.   On: 04/02/2023 10:06   CT Angio Abd/Pel w/ and/or w/o Result Date: 03/23/2023 CLINICAL DATA:  78 year old male with a history of abdominal aortic aneurysm status post endovascular aortic repair complicated by type 2 endoleak with enlargement of the aneurysm sac. He underwent endovascular repair of the endoleak on 06/30/2019. Continued follow-up evaluation. EXAM: CTA ABDOMEN AND PELVIS WITHOUT AND WITH CONTRAST  TECHNIQUE: Multidetector CT imaging of the abdomen and pelvis was performed using the standard protocol during bolus administration of intravenous contrast. Multiplanar reconstructed images and MIPs were obtained and reviewed to evaluate the vascular anatomy. RADIATION DOSE REDUCTION: This exam was  performed according to the departmental dose-optimization program which includes automated exposure control, adjustment of the mA and/or kV according to patient size and/or use of iterative reconstruction technique. CONTRAST:  OMNIPAQUE IOHEXOL 350 MG/ML SOLN COMPARISON:  Prior CTA of the abdomen and pelvis 03/13/2022 FINDINGS: VASCULAR Aorta: Fusiform aneurysmal dilation of the infrarenal abdominal aorta with postsurgical changes of endovascular aortic repair utilizing a bifurcated endoprosthesis extending into the common iliac arteries bilaterally as well as endovascular repair of type 2 endoleak with coil embolization of an accessory artery to the lower pole of the left kidney, liquid embolization of aneurysm sac and coil embolization of lumbar arteries. The excluded aneurysm sac measures 6.1 x 5.7 cm, incrementally enlarged compared to 6.0 by 5.5 cm previously. No evidence of type 2 endoleak on arterial phase images. However, there is delayed contrast enhancement within the superior aspect of the excluded aneurysm sac on the venous phase images. Celiac: Patent without evidence of aneurysm, dissection, vasculitis or significant stenosis. SMA: Patent without evidence of aneurysm, dissection, vasculitis or significant stenosis. Renals: Single right renal artery. Calcified atherosclerotic plaque results in mild stenosis. Widely patent dominant left renal artery. Prior coil embolization of small accessory artery to the lower pole of the left kidney. IMA: The origin is embolized. The vessel reconstitutes via collateral flow. 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 Proximal  Outflow: Bilateral common femoral and visualized portions of the superficial and profunda femoral arteries are patent without evidence of aneurysm, dissection, vasculitis or significant stenosis. Veins: No focal venous abnormality. Review of the MIP images confirms the above findings. NON-VASCULAR Lower chest: No acute abnormality. Paraseptal pulmonary emphysema. Dependent atelectasis. Hepatobiliary: No focal liver abnormality is seen. No gallstones, gallbladder wall thickening, or biliary dilatation. Pancreas: Unremarkable. No pancreatic ductal dilatation or surrounding inflammatory changes. Spleen: Normal in size without focal abnormality. Adrenals/Urinary Tract: Normal adrenal glands. Stable benign simple renal cysts. No imaging follow-up recommended. Nonobstructing stone in the lower pole of the left kidney. Atrophy and hypoenhancement of the lower pole of the left kidney consistent with known coil embolization of accessory artery to the left lower pole. The ureters and bladder are unremarkable. Stomach/Bowel: No evidence of obstruction or focal bowel wall thickening. Normal appendix in the right lower quadrant. The terminal ileum is unremarkable. Lymphatic: No suspicious lymphadenopathy. Reproductive: Prostatomegaly. Other: No evidence of ascites. Fat containing umbilical and bilateral inguinal hernias. Unchanged. Musculoskeletal: No acute fracture or aggressive appearing lytic or blastic osseous lesion. Multilevel degenerative changes. IMPRESSION: 1. Postsurgical changes of endovascular aortic repair of infrarenal abdominal aortic aneurysm and endovascular repair of type 2 endoleak with a persistent subtle delayed type 2 endoleak in the superior aspect of the aneurysm sac. 2. Small incremental enlargement of excluded aneurysm sac now measuring 6.1 x 5.7 cm compared to 6.0 x 5.5 cm previously. Recommend continued attention on follow-up imaging. 3. 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 4. No acute abnormality within the abdomen or pelvis. 5. Nonobstructing stone in the lower pole of the left kidney. 6. Prostatomegaly. 7. Fat containing umbilical and bilateral inguinal hernias. Aortic Atherosclerosis (ICD10-I70.0) and Emphysema (ICD10-J43.9). Electronically Signed   By: Malachy Moan M.D.   On: 03/23/2023 09:19    Labs:  CBC: Recent Labs    02/06/23 1107  WBC 5.9  HGB 15.4  HCT 44.2  PLT 140*    COAGS: No results for input(s): "INR", "APTT" in the last 8760 hours.  BMP: No results for input(s): "NA", "Lynn", "CL", "CO2", "GLUCOSE", "BUN", "CALCIUM", "CREATININE", "GFRNONAA", "GFRAA" in the last 8760 hours.  Invalid input(s): "CMP"  LIVER FUNCTION TESTS: No results for input(s): "BILITOT", "AST", "ALT", "ALKPHOS", "PROT", "ALBUMIN" in the last 8760 hours.  TUMOR MARKERS: No results for input(s): "AFPTM", "CEA", "CA199", "CHROMGRNA" in the last 8760 hours.  Assessment and Plan:  Very pleasant 78 year old gentleman who continues to do well nearly 4 years status post endovascular repair of type II endoleak.  His aneurysm sac remains essentially stable.  He may have had some slight interval growth over the past year but not significant.  We will continue routine surveillance.  1.) CTA Abd/pelvis and clinic visit in 1 year     Electronically Signed: Sterling Big 04/02/2023, 12:29 PM   I spent a total of  10 Minutes in remote  clinical consultation, greater than 50% of which was counseling/coordinating care for AAA post EVAR with type 3 endoleak.    Visit type: Audio only (telephone). Audio (no video) only due to patient preference. Alternative for in-person consultation at [facility], [address] [place], [place], [place]. This visit type was conducted due to national recommendations for restrictions regarding the COVID-19 Pandemic (e.g. social distancing).  This format is felt to be most appropriate for this patient at this  time.  All issues noted in this document were discussed and addressed.

## 2023-06-08 DIAGNOSIS — G4733 Obstructive sleep apnea (adult) (pediatric): Secondary | ICD-10-CM | POA: Diagnosis not present

## 2023-06-10 ENCOUNTER — Ambulatory Visit (INDEPENDENT_AMBULATORY_CARE_PROVIDER_SITE_OTHER): Admitting: Podiatry

## 2023-06-10 ENCOUNTER — Ambulatory Visit (INDEPENDENT_AMBULATORY_CARE_PROVIDER_SITE_OTHER)

## 2023-06-10 DIAGNOSIS — M79671 Pain in right foot: Secondary | ICD-10-CM

## 2023-06-10 DIAGNOSIS — M79672 Pain in left foot: Secondary | ICD-10-CM | POA: Diagnosis not present

## 2023-06-10 DIAGNOSIS — M5416 Radiculopathy, lumbar region: Secondary | ICD-10-CM | POA: Diagnosis not present

## 2023-06-10 NOTE — Progress Notes (Signed)
 Chief Complaint  Patient presents with   Foot Pain    RM#7 Patient states having issues with foot discomfort is interested in orthotics.    HPI: 78 y.o. male presenting today as a new patient for evaluation of slight neuropathy to the bilateral lower extremities as well as possible abnormal gait pattern.  Patient believes that he wears his right heel soles out more than his left lower extremity.  He does also experience sciatica to the right lower extremity.  He is interested in orthotics  Past Medical History:  Diagnosis Date   AAA (abdominal aortic aneurysm) (HCC)    BPH (benign prostatic hyperplasia)    CAD (coronary artery disease) 06/25/2012   a. Inf STEMI s/p cath- DES-mid RCA b. Echo- EF 55%, basal inferior HK   Cancer (HCC)    Carotid artery disease (HCC)    Central sleep apnea    COPD (chronic obstructive pulmonary disease) (HCC)    Essential hypertension    History of heart attack    Hypercholesterolemia    Prostate cancer (HCC)    Tobacco abuse     Past Surgical History:  Procedure Laterality Date   ABDOMINAL AORTIC ENDOVASCULAR STENT GRAFT N/A 11/11/2017   Procedure: ABDOMINAL AORTIC ENDOVASCULAR STENT GRAFT;  Surgeon: Cephus Shelling, MD;  Location: [facility] OR;  Service: Vascular;  Laterality: N/A;   CATARACT EXTRACTION  11/2019   CORONARY ANGIOPLASTY WITH STENT PLACEMENT  06/25/2012   100% mid RCA s/p DES, 30% prox AVG Cx, mild luminal irregularities elsewhere; EF 55%   HERNIA REPAIR     IR ANGIOGRAM PELVIS SELECTIVE OR SUPRASELECTIVE  06/30/2019   IR ANGIOGRAM SELECTIVE EACH ADDITIONAL VESSEL  06/30/2019   IR ANGIOGRAM SELECTIVE EACH ADDITIONAL VESSEL  06/30/2019   IR ANGIOGRAM SELECTIVE EACH ADDITIONAL VESSEL  06/30/2019   IR ANGIOGRAM VISCERAL SELECTIVE  06/30/2019   IR ANGIOGRAM VISCERAL SELECTIVE  06/30/2019   IR EMBO ARTERIAL NOT HEMORR HEMANG INC GUIDE ROADMAPPING  06/30/2019   IR RADIOLOGIST EVAL & MGMT  06/01/2019   IR RADIOLOGIST EVAL & MGMT   07/28/2019   IR RADIOLOGIST EVAL & MGMT  03/14/2020   IR RADIOLOGIST EVAL & MGMT  04/03/2021   IR RADIOLOGIST EVAL & MGMT  07/08/2021   IR RADIOLOGIST EVAL & MGMT  04/02/2023   IR US GUIDE VASC ACCESS RIGHT  06/30/2019   LEFT HEART CATHETERIZATION WITH CORONARY ANGIOGRAM N/A 06/25/2012   Procedure: LEFT HEART CATHETERIZATION WITH CORONARY ANGIOGRAM;  Surgeon: Kathleene Hazel, MD;  Location[facility] CATH LAB;  Service: Cardiovascular;  Laterality: N/A;   PERCUTANEOUS CORONARY STENT INTERVENTION (PCI-S)  06/25/2012   Procedure: PERCUTANEOUS CORONARY STENT INTERVENTION (PCI-S);  Surgeon: Kathleene Hazel, MD;  Location: [facility] CATH LAB;  Service: Cardiovascular;;    No Known Allergies   Physical Exam: General: The patient is alert and oriented x3 in no acute distress.  Dermatology: Skin is warm, dry and supple bilateral lower extremities.   Vascular: Palpable pedal pulses bilaterally. Capillary refill within normal limits.  No appreciable edema.  No erythema.  Neurological: Light touch and protective threshold slightly diminished  Musculoskeletal Exam: No pedal deformities noted.  Muscle strength 5/5 all compartments   Assessment/Plan of Care: 1.  Lumbar radiculopathy RLE 2.  Mild peripheral neuropathy  -Patient evaluated -Comprehensive lower extremity biomechanical exam performed today.  I do believe the patient would benefit from custom molded orthotics to support the posterior and potentially alleviate some of the sciatica he is experiencing in his lower back -  Appointment with orthotics department for custom molded insoles -Return to clinic with me as needed       Felecia Shelling, DPM [facility]  Dr. Felecia Shelling, DPM    [address] [place], [place] [address]                Office [phone]  Fax [phone]

## 2023-08-03 DIAGNOSIS — Z1331 Encounter for screening for depression: Secondary | ICD-10-CM | POA: Diagnosis not present

## 2023-08-03 DIAGNOSIS — K219 Gastro-esophageal reflux disease without esophagitis: Secondary | ICD-10-CM | POA: Diagnosis not present

## 2023-08-03 DIAGNOSIS — C61 Malignant neoplasm of prostate: Secondary | ICD-10-CM | POA: Diagnosis not present

## 2023-08-03 DIAGNOSIS — M25532 Pain in left wrist: Secondary | ICD-10-CM | POA: Diagnosis not present

## 2023-08-03 DIAGNOSIS — D7589 Other specified diseases of blood and blood-forming organs: Secondary | ICD-10-CM | POA: Diagnosis not present

## 2023-08-03 DIAGNOSIS — D696 Thrombocytopenia, unspecified: Secondary | ICD-10-CM | POA: Diagnosis not present

## 2023-08-03 DIAGNOSIS — M19049 Primary osteoarthritis, unspecified hand: Secondary | ICD-10-CM | POA: Diagnosis not present

## 2023-08-03 DIAGNOSIS — I2510 Atherosclerotic heart disease of native coronary artery without angina pectoris: Secondary | ICD-10-CM | POA: Diagnosis not present

## 2023-08-03 DIAGNOSIS — E7800 Pure hypercholesterolemia, unspecified: Secondary | ICD-10-CM | POA: Diagnosis not present

## 2023-08-03 DIAGNOSIS — J439 Emphysema, unspecified: Secondary | ICD-10-CM | POA: Diagnosis not present

## 2023-08-03 DIAGNOSIS — Z0000 Encounter for general adult medical examination without abnormal findings: Secondary | ICD-10-CM | POA: Diagnosis not present

## 2023-08-03 DIAGNOSIS — M1A9XX0 Chronic gout, unspecified, without tophus (tophi): Secondary | ICD-10-CM | POA: Diagnosis not present

## 2023-08-03 DIAGNOSIS — N400 Enlarged prostate without lower urinary tract symptoms: Secondary | ICD-10-CM | POA: Diagnosis not present

## 2023-08-05 ENCOUNTER — Other Ambulatory Visit

## 2023-08-06 DIAGNOSIS — Z8546 Personal history of malignant neoplasm of prostate: Secondary | ICD-10-CM | POA: Diagnosis not present

## 2023-08-10 DIAGNOSIS — N401 Enlarged prostate with lower urinary tract symptoms: Secondary | ICD-10-CM | POA: Diagnosis not present

## 2023-08-10 DIAGNOSIS — Z8546 Personal history of malignant neoplasm of prostate: Secondary | ICD-10-CM | POA: Diagnosis not present

## 2023-08-10 DIAGNOSIS — R351 Nocturia: Secondary | ICD-10-CM | POA: Diagnosis not present

## 2023-08-13 DIAGNOSIS — M6281 Muscle weakness (generalized): Secondary | ICD-10-CM | POA: Diagnosis not present

## 2023-08-13 DIAGNOSIS — M545 Low back pain, unspecified: Secondary | ICD-10-CM | POA: Diagnosis not present

## 2023-08-18 DIAGNOSIS — M6281 Muscle weakness (generalized): Secondary | ICD-10-CM | POA: Diagnosis not present

## 2023-08-18 DIAGNOSIS — M545 Low back pain, unspecified: Secondary | ICD-10-CM | POA: Diagnosis not present

## 2023-08-20 DIAGNOSIS — M6281 Muscle weakness (generalized): Secondary | ICD-10-CM | POA: Diagnosis not present

## 2023-08-20 DIAGNOSIS — M545 Low back pain, unspecified: Secondary | ICD-10-CM | POA: Diagnosis not present

## 2023-08-25 DIAGNOSIS — M6281 Muscle weakness (generalized): Secondary | ICD-10-CM | POA: Diagnosis not present

## 2023-08-25 DIAGNOSIS — M545 Low back pain, unspecified: Secondary | ICD-10-CM | POA: Diagnosis not present

## 2023-08-27 DIAGNOSIS — M6281 Muscle weakness (generalized): Secondary | ICD-10-CM | POA: Diagnosis not present

## 2023-08-27 DIAGNOSIS — M545 Low back pain, unspecified: Secondary | ICD-10-CM | POA: Diagnosis not present

## 2023-09-01 DIAGNOSIS — M6281 Muscle weakness (generalized): Secondary | ICD-10-CM | POA: Diagnosis not present

## 2023-09-01 DIAGNOSIS — M545 Low back pain, unspecified: Secondary | ICD-10-CM | POA: Diagnosis not present

## 2023-09-15 DIAGNOSIS — M545 Low back pain, unspecified: Secondary | ICD-10-CM | POA: Diagnosis not present

## 2023-09-15 DIAGNOSIS — M6281 Muscle weakness (generalized): Secondary | ICD-10-CM | POA: Diagnosis not present

## 2023-09-17 DIAGNOSIS — M6281 Muscle weakness (generalized): Secondary | ICD-10-CM | POA: Diagnosis not present

## 2023-09-17 DIAGNOSIS — M545 Low back pain, unspecified: Secondary | ICD-10-CM | POA: Diagnosis not present

## 2023-09-21 ENCOUNTER — Other Ambulatory Visit

## 2023-09-22 DIAGNOSIS — M6281 Muscle weakness (generalized): Secondary | ICD-10-CM | POA: Diagnosis not present

## 2023-09-22 DIAGNOSIS — M545 Low back pain, unspecified: Secondary | ICD-10-CM | POA: Diagnosis not present

## 2023-09-23 ENCOUNTER — Ambulatory Visit

## 2023-09-23 NOTE — Progress Notes (Signed)
 Patient was present discussed possible oop and wanted to try powersteps to start  1/4 heel lift given as well for right LE as measured almost 1/2 shorter than left  Patient will call if any problems arise and if he feels he needs custom FO's  Lolita Schultze CPed, CFo, CFm

## 2023-09-24 DIAGNOSIS — M6281 Muscle weakness (generalized): Secondary | ICD-10-CM | POA: Diagnosis not present

## 2023-09-24 DIAGNOSIS — M545 Low back pain, unspecified: Secondary | ICD-10-CM | POA: Diagnosis not present

## 2023-09-29 DIAGNOSIS — M545 Low back pain, unspecified: Secondary | ICD-10-CM | POA: Diagnosis not present

## 2023-09-29 DIAGNOSIS — M6281 Muscle weakness (generalized): Secondary | ICD-10-CM | POA: Diagnosis not present

## 2023-10-01 DIAGNOSIS — M545 Low back pain, unspecified: Secondary | ICD-10-CM | POA: Diagnosis not present

## 2023-10-01 DIAGNOSIS — M6281 Muscle weakness (generalized): Secondary | ICD-10-CM | POA: Diagnosis not present

## 2023-10-06 DIAGNOSIS — M6281 Muscle weakness (generalized): Secondary | ICD-10-CM | POA: Diagnosis not present

## 2023-10-06 DIAGNOSIS — M545 Low back pain, unspecified: Secondary | ICD-10-CM | POA: Diagnosis not present

## 2023-10-27 DIAGNOSIS — M6281 Muscle weakness (generalized): Secondary | ICD-10-CM | POA: Diagnosis not present

## 2023-10-27 DIAGNOSIS — M545 Low back pain, unspecified: Secondary | ICD-10-CM | POA: Diagnosis not present

## 2023-10-29 DIAGNOSIS — M545 Low back pain, unspecified: Secondary | ICD-10-CM | POA: Diagnosis not present

## 2023-10-29 DIAGNOSIS — M6281 Muscle weakness (generalized): Secondary | ICD-10-CM | POA: Diagnosis not present

## 2023-11-03 DIAGNOSIS — M545 Low back pain, unspecified: Secondary | ICD-10-CM | POA: Diagnosis not present

## 2023-11-03 DIAGNOSIS — M6281 Muscle weakness (generalized): Secondary | ICD-10-CM | POA: Diagnosis not present

## 2023-11-05 DIAGNOSIS — M545 Low back pain, unspecified: Secondary | ICD-10-CM | POA: Diagnosis not present

## 2023-11-05 DIAGNOSIS — M6281 Muscle weakness (generalized): Secondary | ICD-10-CM | POA: Diagnosis not present

## 2023-11-10 DIAGNOSIS — M545 Low back pain, unspecified: Secondary | ICD-10-CM | POA: Diagnosis not present

## 2023-11-10 DIAGNOSIS — M6281 Muscle weakness (generalized): Secondary | ICD-10-CM | POA: Diagnosis not present

## 2023-11-12 DIAGNOSIS — M6281 Muscle weakness (generalized): Secondary | ICD-10-CM | POA: Diagnosis not present

## 2023-11-12 DIAGNOSIS — M545 Low back pain, unspecified: Secondary | ICD-10-CM | POA: Diagnosis not present

## 2023-11-17 DIAGNOSIS — M6281 Muscle weakness (generalized): Secondary | ICD-10-CM | POA: Diagnosis not present

## 2023-11-17 DIAGNOSIS — M545 Low back pain, unspecified: Secondary | ICD-10-CM | POA: Diagnosis not present

## 2023-11-18 ENCOUNTER — Ambulatory Visit: Attending: Physician Assistant | Admitting: Physician Assistant

## 2023-11-18 ENCOUNTER — Encounter: Payer: Self-pay | Admitting: Physician Assistant

## 2023-11-18 VITALS — BP 118/60 | HR 55 | Ht 67.5 in | Wt 190.1 lb

## 2023-11-18 DIAGNOSIS — I2510 Atherosclerotic heart disease of native coronary artery without angina pectoris: Secondary | ICD-10-CM | POA: Diagnosis not present

## 2023-11-18 DIAGNOSIS — I714 Abdominal aortic aneurysm, without rupture, unspecified: Secondary | ICD-10-CM | POA: Diagnosis not present

## 2023-11-18 DIAGNOSIS — I10 Essential (primary) hypertension: Secondary | ICD-10-CM | POA: Insufficient documentation

## 2023-11-18 DIAGNOSIS — I779 Disorder of arteries and arterioles, unspecified: Secondary | ICD-10-CM | POA: Diagnosis not present

## 2023-11-18 MED ORDER — ATORVASTATIN CALCIUM 80 MG PO TABS: 80.0000 mg | ORAL_TABLET | Freq: Every day | ORAL | 3 refills | Status: AC

## 2023-11-18 MED ORDER — METOPROLOL SUCCINATE ER 50 MG PO TB24: ORAL_TABLET | ORAL | 3 refills | Status: AC

## 2023-11-18 MED ORDER — NITROGLYCERIN 0.4 MG SL SUBL: 0.4000 mg | SUBLINGUAL_TABLET | SUBLINGUAL | 3 refills | Status: AC | PRN

## 2023-11-18 MED ORDER — CLOPIDOGREL BISULFATE 75 MG PO TABS: ORAL_TABLET | ORAL | 3 refills | Status: AC

## 2023-11-18 NOTE — Patient Instructions (Signed)
 Medication Instructions:  Your physician recommends that you continue on your current medications as directed. Please refer to the Current Medication list given to you today.  *If you need a refill on your cardiac medications before your next appointment, please call your pharmacy*   Follow-Up: At [facility], you and your health needs are our priority.  As part of our continuing mission to provide you with exceptional heart care, our providers are all part of one team.  This team includes your primary Cardiologist (physician) and Advanced Practice Providers or APPs (Physician Assistants and Nurse Practitioners) who all work together to provide you with the care you need, when you need it.  Your next appointment:   1 year(s)  Provider:   Lonni Cash, MD or Dayna Dunn, PA-C

## 2023-11-18 NOTE — Progress Notes (Signed)
 Cardiology Office Note    Date:  11/18/2023  ID:  Jiovany Scheffel, DOB 06-Apr-1945, MRN [id number] PCP:  Cleotilde Planas, MD  Cardiologist:  Lonni Cash, MD  Electrophysiologist:  None   Chief Complaint: f/u CAD  History of Present Illness: .    Juan Lynn is a 78 y.o. male with visit-pertinent history of CAD (inferior STEMI 06/2012 s/p DES to RCA, mild residual Cx disease), COPD, central sleep apnea (compliant with BiPAP, followed @ [facility]), mild carotid artery disease, former tobacco abuse, habitual ETOH use (2-3 drinks nightly), prostate cancer and AAA s/p aortoiiliac endovascular repair September 2019 (followed by [facility]) who presents for follow-up. Last echo 11/2017 showed EF 55-60%, grade 1 DD, mild LAE. He is also followed by [facility] for history of AAA repair with known endoleak post-op with f/u IR repair in 06/2019. In summer 2021 he was having issues with headaches treated by primary care. CT head nonacute, + microvascular changes. Carotid US  showed <50% RICA, approximately 50% LICA. Fortunately his headaches improved without specific intervention. Repeat carotid duplex 08/2020 showed bilateral carotids were near normal without significant abnormalities. Stress testing 10/2021 was normal. He was trialed on PPI which he no longer has to take. As previously noted, he is comfortable with medical terminology although does not have a medical background. Dr. Cash gave the OK to stop ASA and use Plavix  monotherapy in 2022. He has history of white coat HTN. At prior OV I referred him to hematology for chronic mild thrombocytopenia who felt this could be benign normal variant, chronic ITP, undiagnosed liver disease, or early myelodysplasia. They recommended CBC every 6-12 months and for PCP to evaluate for undiagnosed liver disease - note CT 03/2023 showed no focal liver abnormality.  He is seen back for follow-up doing great from a cardiac standpoint without any angina, dyspnea, syncope, edema or  bleeding. He has been doing PT this year for some orthopedic issues in his hand and sciatica which has helped. He has to start out on the bike during his sessions without any anginal complaints.  Labwork independently reviewed: [facility] 08/2023 Hgb 15.8, trig 108, LDL 61, K 4.2, ALT wnl, TSH wnl, K 4.2, Cr 1.01, LFTs wnl, plt 149 02/2023 Hgb 15.4, plt 140 10/2022 Patient Copies K 4.2, Cr 1.0, LFTs ok, Hgb 14.9, plt 126   ROS: .    Please see the history of present illness.  All other systems are reviewed and otherwise negative.  Studies Reviewed: SABRA    EKG:  EKG is ordered today, personally reviewed, demonstrating   EKG Interpretation Date/Time:  Wednesday November 18 2023 09:50:21 EDT Ventricular Rate:  55 PR Interval:  142 QRS Duration:  98 QT Interval:  416 QTC Calculation: 397 R Axis:   53  Text Interpretation: Sinus bradycardia Nonspecific T wave abnormality without change from prior Confirmed by Bladyn Tipps [phone]) on 11/18/2023 10:22:21 AM         CV Studies: Cardiac studies reviewed are outlined and summarized above. Otherwise please see EMR for full report.   Current Reported Medications:.    Current Meds  Medication Sig   allopurinol (ZYLOPRIM) 100 MG tablet Take 100 mg by mouth daily.   atorvastatin  (LIPITOR ) 80 MG tablet Take 1 tablet (80 mg total) by mouth at bedtime.   clopidogrel  (PLAVIX ) 75 MG tablet TAKE 1 TABLET AT BEDTIME   colchicine 0.6 MG tablet Take 0.6 mg by mouth daily as needed (gout).   finasteride  (PROSCAR ) 5 MG tablet Take 5 mg by  mouth at bedtime.    fluticasone -salmeterol (ADVAIR HFA) 115-21 MCG/ACT inhaler 2 puffs   Fluticasone -Salmeterol (ADVAIR) 100-50 MCG/DOSE AEPB Inhale 1 puff into the lungs 2 (two) times daily as needed (shortness).   metoprolol  succinate (TOPROL -XL) 50 MG 24 hr tablet TAKE 1 TABLET DAILY WITH OR IMMEDIATELY FOLLOWING A MEAL   Multiple Vitamin (MULTIVITAMIN WITH MINERALS) TABS tablet Take 1 tablet by mouth at bedtime.  Centrum silver men   nitroGLYCERIN  (NITROSTAT ) 0.4 MG SL tablet Place 1 tablet (0.4 mg total) under the tongue every 5 (five) minutes as needed for chest pain.   pantoprazole  (PROTONIX ) 40 MG tablet Take 1 tablet (40 mg total) by mouth daily. FUTURE REFILLS SHOULD COME FROM PCP   tamsulosin  (FLOMAX ) 0.4 MG CAPS Take 0.8 mg by mouth at bedtime.     Physical Exam:    VS:  BP 118/60   Pulse (!) 55   Ht 5' 7.5 (1.715 m)   Wt 190 lb 1.6 oz (86.2 kg)   SpO2 96%   BMI 29.33 kg/m    Wt Readings from Last 3 Encounters:  11/18/23 190 lb 1.6 oz (86.2 kg)  02/06/23 187 lb 3.2 oz (84.9 kg)  11/14/22 178 lb (80.7 kg)    GEN: Well nourished, well developed in no acute distress NECK: No JVD; No carotid bruits CARDIAC: RRR, no murmurs, rubs, gallops RESPIRATORY:  Clear to auscultation without rales, wheezing or rhonchi  ABDOMEN: Soft, non-tender, non-distended EXTREMITIES:  No edema; No acute deformity   Asessement and Plan:.    1. CAD - doing well without recent anginal symptoms. He remains on Plavix  75mg  daily monotherapy. Would continue this along with atorvastatin  80mg  at bedtime (only uses colchicine rarely PRN), Toprol  50mg  daily. Lipids at goal by PCP 08/2023. CMET, CBC stable at that time as well.  2. AAA - continues to follow with [facility], [facility]. Last IR f/u 03/2023 with plan for 1 year follow-up.  3. Essential HTN - controlled, no changes made today. Also previously discussed impact of reduction of alcohol use.  4. Mild carotid disease - repeat near normal in 2022. No bruits on exam, no TIA/stroke symptoms. Given stability between two prior studies, reserve repeat imaging for PRN going forward - either new concerns or bruit on examination. Continue atorvastatin  80mg  daily.    Disposition: F/u with myself or Dr. Verlin in 1 year.  Signed, Katelinn Justice N Uilani Sanville, PA-C

## 2023-11-19 DIAGNOSIS — M6281 Muscle weakness (generalized): Secondary | ICD-10-CM | POA: Diagnosis not present

## 2023-11-19 DIAGNOSIS — M545 Low back pain, unspecified: Secondary | ICD-10-CM | POA: Diagnosis not present

## 2023-11-24 DIAGNOSIS — M545 Low back pain, unspecified: Secondary | ICD-10-CM | POA: Diagnosis not present

## 2023-11-24 DIAGNOSIS — M6281 Muscle weakness (generalized): Secondary | ICD-10-CM | POA: Diagnosis not present

## 2023-11-26 DIAGNOSIS — M545 Low back pain, unspecified: Secondary | ICD-10-CM | POA: Diagnosis not present

## 2023-11-26 DIAGNOSIS — M6281 Muscle weakness (generalized): Secondary | ICD-10-CM | POA: Diagnosis not present

## 2023-12-01 DIAGNOSIS — M545 Low back pain, unspecified: Secondary | ICD-10-CM | POA: Diagnosis not present

## 2023-12-01 DIAGNOSIS — M6281 Muscle weakness (generalized): Secondary | ICD-10-CM | POA: Diagnosis not present

## 2023-12-03 DIAGNOSIS — M6281 Muscle weakness (generalized): Secondary | ICD-10-CM | POA: Diagnosis not present

## 2023-12-03 DIAGNOSIS — M545 Low back pain, unspecified: Secondary | ICD-10-CM | POA: Diagnosis not present

## 2023-12-22 DIAGNOSIS — M6281 Muscle weakness (generalized): Secondary | ICD-10-CM | POA: Diagnosis not present

## 2023-12-22 DIAGNOSIS — M545 Low back pain, unspecified: Secondary | ICD-10-CM | POA: Diagnosis not present

## 2023-12-24 DIAGNOSIS — M545 Low back pain, unspecified: Secondary | ICD-10-CM | POA: Diagnosis not present

## 2023-12-24 DIAGNOSIS — M6281 Muscle weakness (generalized): Secondary | ICD-10-CM | POA: Diagnosis not present

## 2023-12-28 ENCOUNTER — Encounter: Payer: Self-pay | Admitting: Acute Care

## 2024-01-05 ENCOUNTER — Other Ambulatory Visit: Payer: Self-pay | Admitting: Interventional Radiology

## 2024-01-05 DIAGNOSIS — I9789 Other postprocedural complications and disorders of the circulatory system, not elsewhere classified: Secondary | ICD-10-CM

## 2024-01-07 DIAGNOSIS — M6281 Muscle weakness (generalized): Secondary | ICD-10-CM | POA: Diagnosis not present

## 2024-01-07 DIAGNOSIS — M545 Low back pain, unspecified: Secondary | ICD-10-CM | POA: Diagnosis not present

## 2024-01-19 DIAGNOSIS — M545 Low back pain, unspecified: Secondary | ICD-10-CM | POA: Diagnosis not present

## 2024-01-19 DIAGNOSIS — M6281 Muscle weakness (generalized): Secondary | ICD-10-CM | POA: Diagnosis not present

## 2024-01-21 DIAGNOSIS — M545 Low back pain, unspecified: Secondary | ICD-10-CM | POA: Diagnosis not present

## 2024-01-21 DIAGNOSIS — M6281 Muscle weakness (generalized): Secondary | ICD-10-CM | POA: Diagnosis not present

## 2024-01-26 DIAGNOSIS — M545 Low back pain, unspecified: Secondary | ICD-10-CM | POA: Diagnosis not present

## 2024-01-26 DIAGNOSIS — M6281 Muscle weakness (generalized): Secondary | ICD-10-CM | POA: Diagnosis not present

## 2024-02-02 DIAGNOSIS — M6281 Muscle weakness (generalized): Secondary | ICD-10-CM | POA: Diagnosis not present

## 2024-02-02 DIAGNOSIS — M545 Low back pain, unspecified: Secondary | ICD-10-CM | POA: Diagnosis not present

## 2024-02-04 DIAGNOSIS — M6281 Muscle weakness (generalized): Secondary | ICD-10-CM | POA: Diagnosis not present

## 2024-02-04 DIAGNOSIS — M545 Low back pain, unspecified: Secondary | ICD-10-CM | POA: Diagnosis not present

## 2024-02-09 DIAGNOSIS — Z6830 Body mass index (BMI) 30.0-30.9, adult: Secondary | ICD-10-CM | POA: Diagnosis not present

## 2024-02-09 DIAGNOSIS — M25532 Pain in left wrist: Secondary | ICD-10-CM | POA: Diagnosis not present

## 2024-02-09 DIAGNOSIS — Z8546 Personal history of malignant neoplasm of prostate: Secondary | ICD-10-CM | POA: Diagnosis not present

## 2024-02-09 DIAGNOSIS — M1812 Unilateral primary osteoarthritis of first carpometacarpal joint, left hand: Secondary | ICD-10-CM | POA: Diagnosis not present

## 2024-02-09 DIAGNOSIS — M1A9XX0 Chronic gout, unspecified, without tophus (tophi): Secondary | ICD-10-CM | POA: Diagnosis not present

## 2024-02-10 DIAGNOSIS — M25532 Pain in left wrist: Secondary | ICD-10-CM | POA: Diagnosis not present

## 2024-02-11 DIAGNOSIS — M6281 Muscle weakness (generalized): Secondary | ICD-10-CM | POA: Diagnosis not present

## 2024-02-11 DIAGNOSIS — M545 Low back pain, unspecified: Secondary | ICD-10-CM | POA: Diagnosis not present

## 2024-02-16 DIAGNOSIS — M6281 Muscle weakness (generalized): Secondary | ICD-10-CM | POA: Diagnosis not present

## 2024-02-16 DIAGNOSIS — M545 Low back pain, unspecified: Secondary | ICD-10-CM | POA: Diagnosis not present

## 2024-03-07 ENCOUNTER — Ambulatory Visit (HOSPITAL_BASED_OUTPATIENT_CLINIC_OR_DEPARTMENT_OTHER)
Admission: RE | Admit: 2024-03-07 | Discharge: 2024-03-07 | Disposition: A | Discharge status: Home / Self Care | Source: Ambulatory Visit | Attending: Acute Care | Admitting: Acute Care

## 2024-03-07 DIAGNOSIS — Z122 Encounter for screening for malignant neoplasm of respiratory organs: Secondary | ICD-10-CM | POA: Insufficient documentation

## 2024-03-07 DIAGNOSIS — Z87891 Personal history of nicotine dependence: Secondary | ICD-10-CM | POA: Diagnosis present

## 2024-03-14 ENCOUNTER — Encounter: Payer: Self-pay | Admitting: Radiology

## 2024-03-14 ENCOUNTER — Ambulatory Visit
Admission: RE | Admit: 2024-03-14 | Discharge: 2024-03-14 | Disposition: A | Source: Ambulatory Visit | Attending: Interventional Radiology | Admitting: Interventional Radiology

## 2024-03-14 ENCOUNTER — Telehealth: Payer: Self-pay | Admitting: *Deleted

## 2024-03-14 DIAGNOSIS — I9789 Other postprocedural complications and disorders of the circulatory system, not elsewhere classified: Secondary | ICD-10-CM

## 2024-03-14 MED ORDER — IOPAMIDOL (ISOVUE-370) INJECTION 76%
100.0000 mL | Freq: Once | INTRAVENOUS | Status: AC | PRN
  Administered 2024-03-14: 100 mL via INTRAVENOUS

## 2024-03-14 NOTE — Telephone Encounter (Signed)
 Call report from [facility] Radiology:  IMPRESSION: 1. Lung-RADS 3, probably benign findings. Short-term follow-up in 6 months is recommended with repeat low-dose chest CT without contrast (please use the following order, CT CHEST LCS NODULE FOLLOW-UP W/O CM). New 4.2 mm peripheral right upper lobe nodule, likely infectious/inflammatory. 2. Aortic Atherosclerosis (ICD10-I70.0) and Emphysema (ICD10-J43.9). Coronary artery calcifications. Assessment for potential risk factor modification, dietary therapy or pharmacologic therapy may be warranted, if clinically indicated.

## 2024-03-16 ENCOUNTER — Other Ambulatory Visit: Payer: Self-pay

## 2024-03-16 DIAGNOSIS — R911 Solitary pulmonary nodule: Secondary | ICD-10-CM

## 2024-03-16 DIAGNOSIS — Z122 Encounter for screening for malignant neoplasm of respiratory organs: Secondary | ICD-10-CM

## 2024-03-16 DIAGNOSIS — Z87891 Personal history of nicotine dependence: Secondary | ICD-10-CM

## 2024-03-16 NOTE — Telephone Encounter (Signed)
 Spoke with patient and reviewed recent Lung CT results. He is in agreement to complete a 6 month follow up to evaluate a new 4.2 mm nodule. Order placed, he has been scheduled for 09/14/2024 at [facility]. Results and plan to PCP.

## 2024-03-25 ENCOUNTER — Other Ambulatory Visit: Payer: Self-pay | Admitting: Interventional Radiology

## 2024-03-25 DIAGNOSIS — I9789 Other postprocedural complications and disorders of the circulatory system, not elsewhere classified: Secondary | ICD-10-CM

## 2024-04-18 ENCOUNTER — Other Ambulatory Visit

## 2024-09-14 ENCOUNTER — Ambulatory Visit (HOSPITAL_BASED_OUTPATIENT_CLINIC_OR_DEPARTMENT_OTHER)
